# Patient Record
Sex: Female | Born: 1940 | Race: White | Hispanic: No | Marital: Married | Smoking: Never smoker
Health system: Southern US, Community
[De-identification: ages and names within clinical notes are randomized; demographics above are authoritative.]

## PROBLEM LIST (undated history)

## (undated) DIAGNOSIS — R2689 Other abnormalities of gait and mobility: Secondary | ICD-10-CM

## (undated) DIAGNOSIS — Z8679 Personal history of other diseases of the circulatory system: Secondary | ICD-10-CM

## (undated) DIAGNOSIS — Z973 Presence of spectacles and contact lenses: Secondary | ICD-10-CM

## (undated) DIAGNOSIS — R000 Tachycardia, unspecified: Secondary | ICD-10-CM

## (undated) DIAGNOSIS — Z87440 Personal history of urinary (tract) infections: Secondary | ICD-10-CM

## (undated) DIAGNOSIS — M1990 Unspecified osteoarthritis, unspecified site: Secondary | ICD-10-CM

## (undated) DIAGNOSIS — Z9289 Personal history of other medical treatment: Secondary | ICD-10-CM

## (undated) DIAGNOSIS — R1013 Epigastric pain: Secondary | ICD-10-CM

## (undated) DIAGNOSIS — I10 Essential (primary) hypertension: Secondary | ICD-10-CM

## (undated) DIAGNOSIS — M26609 Unspecified temporomandibular joint disorder, unspecified side: Secondary | ICD-10-CM

## (undated) DIAGNOSIS — R202 Paresthesia of skin: Secondary | ICD-10-CM

## (undated) DIAGNOSIS — F419 Anxiety disorder, unspecified: Secondary | ICD-10-CM

## (undated) DIAGNOSIS — K219 Gastro-esophageal reflux disease without esophagitis: Secondary | ICD-10-CM

## (undated) DIAGNOSIS — K922 Gastrointestinal hemorrhage, unspecified: Secondary | ICD-10-CM

## (undated) DIAGNOSIS — G43909 Migraine, unspecified, not intractable, without status migrainosus: Secondary | ICD-10-CM

## (undated) HISTORY — DX: Essential (primary) hypertension: I10

## (undated) HISTORY — DX: Tachycardia, unspecified: R00.0

## (undated) HISTORY — PX: ARTHROPLASTY: SHX135

## (undated) HISTORY — DX: Epigastric pain: R10.13

## (undated) HISTORY — PX: TONSILLECTOMY: SUR1361

## (undated) HISTORY — DX: Personal history of other diseases of the circulatory system: Z86.79

## (undated) HISTORY — PX: APPENDECTOMY: SHX54

## (undated) HISTORY — PX: REPLACEMENT TOTAL KNEE: SUR1224

## (undated) HISTORY — PX: OTHER SURGICAL HISTORY: SHX169

## (undated) HISTORY — DX: Unspecified temporomandibular joint disorder, unspecified side: M26.609

## (undated) HISTORY — DX: Migraine, unspecified, not intractable, without status migrainosus: G43.909

---

## 1983-03-21 HISTORY — PX: ABDOMINAL HYSTERECTOMY: SHX81

## 1998-05-18 ENCOUNTER — Other Ambulatory Visit: Admission: RE | Admit: 1998-05-18 | Discharge: 1998-05-18 | Payer: Self-pay | Admitting: Obstetrics and Gynecology

## 1999-06-16 ENCOUNTER — Other Ambulatory Visit: Admission: RE | Admit: 1999-06-16 | Discharge: 1999-06-16 | Payer: Self-pay | Admitting: Obstetrics and Gynecology

## 1999-08-16 ENCOUNTER — Encounter: Payer: Self-pay | Admitting: Internal Medicine

## 1999-08-16 ENCOUNTER — Encounter: Admission: RE | Admit: 1999-08-16 | Discharge: 1999-08-16 | Payer: Self-pay | Admitting: Internal Medicine

## 2000-02-18 HISTORY — PX: LAPAROSCOPIC APPENDECTOMY: SUR753

## 2000-03-03 ENCOUNTER — Encounter (INDEPENDENT_AMBULATORY_CARE_PROVIDER_SITE_OTHER): Payer: Self-pay | Admitting: Specialist

## 2000-03-03 ENCOUNTER — Encounter: Payer: Self-pay | Admitting: Emergency Medicine

## 2000-03-04 ENCOUNTER — Encounter: Payer: Self-pay | Admitting: Emergency Medicine

## 2000-03-04 ENCOUNTER — Encounter: Payer: Self-pay | Admitting: Surgery

## 2000-03-04 ENCOUNTER — Inpatient Hospital Stay (HOSPITAL_COMMUNITY): Admission: EM | Admit: 2000-03-04 | Discharge: 2000-03-05 | Payer: Self-pay | Admitting: Emergency Medicine

## 2001-05-20 ENCOUNTER — Encounter: Payer: Self-pay | Admitting: Otolaryngology

## 2001-05-20 ENCOUNTER — Encounter: Admission: RE | Admit: 2001-05-20 | Discharge: 2001-05-20 | Payer: Self-pay | Admitting: Otolaryngology

## 2001-07-10 ENCOUNTER — Other Ambulatory Visit: Admission: RE | Admit: 2001-07-10 | Discharge: 2001-07-10 | Payer: Self-pay | Admitting: Obstetrics and Gynecology

## 2004-01-28 ENCOUNTER — Ambulatory Visit: Payer: Self-pay | Admitting: Internal Medicine

## 2004-07-15 ENCOUNTER — Ambulatory Visit: Payer: Self-pay | Admitting: Internal Medicine

## 2004-09-15 ENCOUNTER — Ambulatory Visit: Payer: Self-pay | Admitting: Internal Medicine

## 2005-04-10 ENCOUNTER — Ambulatory Visit: Payer: Self-pay | Admitting: Internal Medicine

## 2005-04-17 ENCOUNTER — Ambulatory Visit: Payer: Self-pay | Admitting: Internal Medicine

## 2005-10-18 HISTORY — PX: KNEE ARTHROSCOPY: SUR90

## 2006-03-20 HISTORY — PX: WISDOM TOOTH EXTRACTION: SHX21

## 2006-03-29 ENCOUNTER — Ambulatory Visit: Payer: Self-pay | Admitting: Internal Medicine

## 2006-05-30 ENCOUNTER — Ambulatory Visit: Payer: Self-pay | Admitting: Internal Medicine

## 2006-12-06 ENCOUNTER — Ambulatory Visit: Payer: Self-pay | Admitting: Internal Medicine

## 2006-12-06 ENCOUNTER — Encounter: Payer: Self-pay | Admitting: Internal Medicine

## 2006-12-06 DIAGNOSIS — M26609 Unspecified temporomandibular joint disorder, unspecified side: Secondary | ICD-10-CM | POA: Insufficient documentation

## 2007-01-29 ENCOUNTER — Encounter: Payer: Self-pay | Admitting: Internal Medicine

## 2007-02-05 ENCOUNTER — Encounter: Payer: Self-pay | Admitting: Internal Medicine

## 2007-02-26 ENCOUNTER — Encounter: Payer: Self-pay | Admitting: *Deleted

## 2007-02-26 DIAGNOSIS — I10 Essential (primary) hypertension: Secondary | ICD-10-CM | POA: Insufficient documentation

## 2007-02-26 DIAGNOSIS — Z9089 Acquired absence of other organs: Secondary | ICD-10-CM | POA: Insufficient documentation

## 2007-02-26 DIAGNOSIS — K219 Gastro-esophageal reflux disease without esophagitis: Secondary | ICD-10-CM | POA: Insufficient documentation

## 2007-02-26 DIAGNOSIS — I471 Supraventricular tachycardia: Secondary | ICD-10-CM | POA: Insufficient documentation

## 2007-02-26 DIAGNOSIS — Z9889 Other specified postprocedural states: Secondary | ICD-10-CM | POA: Insufficient documentation

## 2007-02-26 DIAGNOSIS — Z9079 Acquired absence of other genital organ(s): Secondary | ICD-10-CM | POA: Insufficient documentation

## 2007-02-26 DIAGNOSIS — G43909 Migraine, unspecified, not intractable, without status migrainosus: Secondary | ICD-10-CM | POA: Insufficient documentation

## 2007-07-09 ENCOUNTER — Encounter: Payer: Self-pay | Admitting: Internal Medicine

## 2007-07-24 ENCOUNTER — Ambulatory Visit: Payer: Self-pay | Admitting: Internal Medicine

## 2007-07-24 DIAGNOSIS — R7989 Other specified abnormal findings of blood chemistry: Secondary | ICD-10-CM | POA: Insufficient documentation

## 2007-07-25 ENCOUNTER — Encounter: Payer: Self-pay | Admitting: Internal Medicine

## 2007-07-31 ENCOUNTER — Encounter: Payer: Self-pay | Admitting: Internal Medicine

## 2008-01-30 ENCOUNTER — Encounter: Payer: Self-pay | Admitting: Internal Medicine

## 2008-09-03 ENCOUNTER — Telehealth: Payer: Self-pay | Admitting: Internal Medicine

## 2008-12-11 ENCOUNTER — Ambulatory Visit: Payer: Self-pay | Admitting: Internal Medicine

## 2008-12-28 ENCOUNTER — Telehealth: Payer: Self-pay | Admitting: Internal Medicine

## 2008-12-30 ENCOUNTER — Encounter: Payer: Self-pay | Admitting: Internal Medicine

## 2009-02-01 ENCOUNTER — Encounter: Payer: Self-pay | Admitting: Internal Medicine

## 2009-02-01 LAB — HM MAMMOGRAPHY: HM Mammogram: NORMAL

## 2009-03-03 LAB — CONVERTED CEMR LAB: Pap Smear: NORMAL

## 2009-06-15 ENCOUNTER — Encounter: Payer: Self-pay | Admitting: Internal Medicine

## 2009-08-19 ENCOUNTER — Encounter: Payer: Self-pay | Admitting: Internal Medicine

## 2009-11-19 ENCOUNTER — Telehealth: Payer: Self-pay | Admitting: Internal Medicine

## 2009-12-13 ENCOUNTER — Ambulatory Visit: Payer: Self-pay | Admitting: Internal Medicine

## 2009-12-13 DIAGNOSIS — M1990 Unspecified osteoarthritis, unspecified site: Secondary | ICD-10-CM | POA: Insufficient documentation

## 2010-02-02 ENCOUNTER — Encounter: Payer: Self-pay | Admitting: Internal Medicine

## 2010-04-19 NOTE — Progress Notes (Signed)
Summary: REQ FOR RX   Phone Note Call from Patient Call back at [facility] Phone [phone]   Summary of Call: Pt has upcomming office visit. She would like refill of celebrex to go to [facility]. Rx is old and she only takes med as needed. Please advise.  Initial call taken by: Lamar Sprinkles, CMA,  November 19, 2009 3:38 PM  Follow-up for Phone Call        called pt - will renew celebrex.    New/Updated Medications: CELEBREX 200 MG CAPS (CELECOXIB) 1 by mouth once daily as needed arthritic pain Prescriptions: CELEBREX 200 MG CAPS (CELECOXIB) 1 by mouth once daily as needed arthritic pain  #30 x 2   Entered and Authorized by:   Jacques Navy MD   Signed by:   Jacques Navy MD on 11/19/2009   Method used:   Electronically to        [facility]. [phone]* (retail)       [address]       [place], [place]  [address]       Ph: [phone]       Fax: [phone]   RxID:   [id number]

## 2010-04-19 NOTE — Letter (Signed)
Summary: Screening/[facility]  Screening/[facility]   Imported By: Lester Benton 08/19/2009 10:22:34  _____________________________________________________________________  External Attachment:    Type:   Image     Comment:   External Document

## 2010-04-19 NOTE — Assessment & Plan Note (Signed)
Summary: YEARLY---STC   Vital Signs:  Patient profile:   70 year old female Height:      67 inches Weight:      162 pounds BMI:     25.46 O2 Sat:      97 % on Room air Temp:     97.0 degrees F oral Pulse rate:   75 / minute BP sitting:   140 / 64  (left arm) Cuff size:   regular  Vitals Entered By: Alysia Penna (December 13, 2009 1:24 PM)  O2 Flow:  Room air CC: pt here for cpx. /cp sma   Primary Care Koehn Salehi:  Norins  CC:  pt here for cpx. /cp sma.  History of Present Illness: Patient presents for a preventive care exam and follow-up for medical problems.  She does have  chronic left knee pain which affects her gait. She has been working with a personal trader: strengthening quads and hamstrings which has helped. She is also working to strengthing UE and this has helped left shoulder pain. She has a great respect for falls and has been working on balance as well as strength to prevent falls.  She has otherwise been doing well without other medical problems, no surgeries, no injuries.  Denies having any problems with depression or depressed mood. Totally independent in all ADLs. 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   Preventive Screening-Counseling & Management  Alcohol-Tobacco     Alcohol drinks/day: <1     Alcohol type: wine     Alcohol Counseling: not indicated; use of alcohol is not excessive or problematic     Smoking Status: never  Caffeine-Diet-Exercise     Caffeine use/day: no caffeinated beverages     Diet Comments: heart healthy diet     Diet Counseling: not indicated; diet is assessed to be healthy     Does Patient Exercise: yes     Type of exercise: stretching and muscle training     Exercise (avg: min/session): 30-60     Times/week: <3     Exercise Counseling: not indicated; exercise is adequate     [facility] Depression Score: no  depression  Hep-HIV-STD-Contraception     STD Risk: no risk noted     Dental Visit-last 6 months yes     SBE monthly: no     Sun Exposure-Excessive: no  Safety-Violence-Falls     Seat Belt Use: yes     Firearms in the Home: no firearms in the home     Smoke Detectors: yes     Violence in the Home: no risk noted     Sexual Abuse: no     Fall Risk: aware of risk     Fall Risk Counseling: not indicated; no significant falls noted      Sexual History:  currently monogamous.        Drug Use:  never.        Blood Transfusions:  no.    Current Medications (verified): 1)  Amlodipine Besylate 10 Mg  Tabs (Amlodipine Besylate) .Marland Kitchen.. 1 By Mouth Once Daily 2)  Premarin 0.625 Mg  Tabs (Estrogens Conjugated) .... Take Once Daily 3)  Furosemide 40 Mg  Tabs (Furosemide) .... Take One Tablet Once Daily 4)  Nexium 40 Mg  Cpdr (Esomeprazole Magnesium) .... Take 1 Tablet By Mouth Once A Day 5)  Calcium 1200 1200-1000 Mg-Unit  Chew (Calcium Carbonate-Vit D-Min) .... Take  1 Tablet Twice Daily 6)  Vitamin B-12 1000 Mcg  Tabs (Cyanocobalamin) .... As Directed 7)  Celebrex 200 Mg Caps (Celecoxib) .Marland Kitchen.. 1 By Mouth Once Daily As Needed Arthritic Pain  Allergies (verified): 1)  ! Sulfa 2)  ! 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 Nona Dell - Dr. Lequita Halt              Oral surgeon - Dr. Delma Post              Dentist - Dr. 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  Risk Factors: Alcohol Use: <1 (12/13/2009) Caffeine Use: no caffeinated beverages (12/13/2009) Diet: heart healthy diet (12/13/2009) Exercise: yes (12/13/2009)  Risk Factors: Smoking Status: never (12/13/2009)  Social History: Caffeine use/day:  no caffeinated beverages Does Patient Exercise:  yes STD Risk:  no risk noted Dental Care w/in 6 mos.:  yes Sun Exposure-Excessive:  no Seat Belt Use:  yes Fall Risk:  aware of risk Sexual History:  currently monogamous Drug Use:  never Blood Transfusions:  no  Review of Systems  The patient denies anorexia, fever, weight loss, weight gain, vision loss, decreased hearing, hoarseness, chest pain, syncope, dyspnea on exertion, peripheral edema, headaches, abdominal pain, hematochezia, severe indigestion/heartburn, incontinence, muscle weakness, suspicious skin lesions, difficulty walking, depression, abnormal bleeding, enlarged lymph nodes, angioedema, and breast masses.         occasional SVT responds to valsalva. Has discomfort right knee but not as much as left.   Physical Exam  General:  alert, well-developed, well-nourished, well-hydrated, appropriate dress, and normal appearance.   Head:  normocephalic, atraumatic, and no abnormalities observed.   Eyes:  vision grossly intact, pupils equal, pupils round, corneas and lenses clear, and no injection.   Ears:  R ear normal, L ear normal, and no external deformities.   Nose:  no external deformity and no external erythema.   Mouth:  Oral mucosa and oropharynx without lesions or exudates.  Teeth in good repair. Neck:  supple, full ROM, no thyromegaly, and no carotid bruits.   Chest Wall:  no deformities and no tenderness.   Breasts:   deferred to Gyn Lungs:  Normal respiratory effort, chest expands symmetrically. Lungs are clear to auscultation, no crackles or wheezes. Heart:  Normal rate and regular rhythm. S1 and S2 normal without gallop, murmur, click, rub or other extra sounds. Abdomen:  soft, non-tender, normal bowel sounds, no masses, no guarding, and no hepatomegaly.   Genitalia:  deferred to gyn Msk:  normal ROM, no joint swelling, no joint warmth, no joint deformities, and no joint instability.   Pulses:  2+ radial and dorsalis pedis pulses Extremities:  No clubbing, cyanosis,  edema, or deformity noted with normal full range of motion of all joints.   Neurologic:  alert & oriented X3, cranial nerves II-XII intact, gait normal, and DTRs symmetrical and normal.   Skin:  turgor normal, color normal, no rashes, and no suspicious lesions.  Mole in the left preauricular area -appears benign Cervical Nodes:  No lymphadenopathy noted Psych:  Oriented X3, memory intact for recent and remote, normally interactive, and good eye contact.     Impression & Recommendations:  Problem # 1:  HYPERTENSION (ICD-401.9)  Her updated medication list for this problem includes:    Amlodipine Besylate 10 Mg Tabs (Amlodipine besylate) .Marland Kitchen... 1 by mouth once daily    Furosemide 40 Mg Tabs (Furosemide) .Marland Kitchen... Take one tablet once daily  BP today: 140/64 Prior BP: 140/76 (12/11/2008)  Reviewed home record - good control.  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   Problem # 3:  DYSPEPSIA, HX OF (ICD-V12.79) Controlled with the use of nexium.  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 She will use occasional as needed celebrex 100mg .  Plan - continue exercise regimen, consider 3 times a week.   Her updated  medication list for this problem includes:    Celebrex 100 Mg Caps (Celecoxib) .Marland Kitchen... 1 by mouth once daily as needed  Problem # 5:  Preventive Health Care (ICD-V70.0) Unremarkable interval history and a normal limited exam. Lab from outside reviewed and normal including normal LDL cholesterol, blood sugar and renal function. She is current with gyn and breast health. she has had recent normal bone density study. Advised her on colorectal cancer screening and colonoscopy - she defers at this time. Immunizations are up to date: Tetnus Spet '10, Pnuemonia vax Sept '08, Shingles Jan '07.  Patient has no signs or symptoms of depression. She is independent in ADLs. No cognitive problems - see HPI. Increased fall risk with arthritic knee but she is aware and works on balance and strengthening to reduce this risk. She is counselled on colorectal cancer screening.  In summary - a delightful woman who is medically stable and in good health. She is investing in her health with concrete dividends: decreased pain and increased mobility.  she will return as needed or 1 year.   Complete Medication List: 1)  Amlodipine Besylate 10 Mg Tabs (Amlodipine besylate) .Marland Kitchen.. 1 by mouth once daily 2)  Premarin 0.625 Mg Tabs (Estrogens conjugated) .... Take once daily 3)  Furosemide 40 Mg Tabs (Furosemide) .... Take one tablet once daily 4)  Nexium 40 Mg Cpdr (Esomeprazole magnesium) .... Take 1 tablet by mouth once a day 5)  Calcium 1200 1200-1000 Mg-unit Chew (Calcium carbonate-vit d-min) .... Take 1 tablet twice daily 6)  Vitamin B-12 1000 Mcg Tabs (Cyanocobalamin) .... As directed 7)  Celebrex 100 Mg Caps (Celecoxib) .Marland Kitchen.. 1 by mouth once daily as needed  Other Orders: Future Orders: Flu Vaccine 48yrs + MEDICARE PATIENTS (A2130) ... 12/14/2009 Administration Flu vaccine - MCR (G0008) ... 12/14/2009 Prescriptions: NEXIUM 40 MG  CPDR (ESOMEPRAZOLE MAGNESIUM) Take 1 tablet by mouth once a day  #90 x 3   Entered and  Authorized by:   Jacques Navy MD   Signed by:   Jacques Navy MD on 12/13/2009   Method used:   Electronically to  [facility]* (retail)             ,          Ph: [phone]       Fax: [phone]   RxID:   [id number] FUROSEMIDE 40 MG  TABS (FUROSEMIDE) Take one tablet once daily  #90 x 3   Entered and Authorized by:   Jacques Navy MD   Signed by:   Jacques Navy MD on 12/13/2009   Method used:   Electronically to        [facility]* (retail)             ,          Ph: [phone]       Fax: [phone]   RxID:   [id number] AMLODIPINE BESYLATE 10 MG  TABS (AMLODIPINE BESYLATE) 1 by mouth once daily  #90 x 3   Entered and Authorized by:   Jacques Navy MD   Signed by:   Jacques Navy MD on 12/13/2009   Method used:   Electronically to        [facility]* (retail)             ,          Ph: [phone]       Fax: [phone]   RxID:   [id number] CELEBREX 100 MG CAPS (CELECOXIB) 1 by mouth once daily as needed  #90 x 3   Entered and Authorized by:   Jacques Navy MD   Signed by:   Jacques Navy MD on 12/13/2009   Method used:   Electronically to        [facility]* (retail)             ,          Ph: [phone]       Fax: [phone]   RxID:   [id number]    Preventive Care Screening  Bone Density:    Date:  02/01/2009    Results:  normal std dev  Pap Smear:    Date:  03/03/2009    Results:  normal   Mammogram:    Date:  02/01/2009    Results:  normal  Flu Vaccine Consent Questions     Do you have a history of severe allergic reactions to this vaccine? no    Any prior history of allergic reactions to egg and/or gelatin? no    Do you have a sensitivity to the preservative Thimersol? no    Do you have a past history of Guillan-Barre Syndrome? no    Do you currently have an acute febrile illness? no    Have you ever had a severe reaction to latex? no    Vaccine information given and explained  to patient? yes    Are you currently pregnant? no    Lot Number:[id number]   Exp Date:09/17/2010   Site Given  Left Deltoid IM  normal   Mammogram:    Date:  02/01/2009    Results:  normal    .lbmedflu

## 2010-05-30 ENCOUNTER — Telehealth: Payer: Self-pay | Admitting: Internal Medicine

## 2010-06-07 NOTE — Progress Notes (Signed)
Summary: Needs f/u ov   Phone Note Call from Patient Call back at Husband cell [phone]   Summary of Call: Patient is requesting labs, husband's wk used to do this every year but pt is retired now.  Initial call taken by: Lamar Sprinkles, CMA,  May 30, 2010 10:02 AM  Follow-up for Phone Call        same argument as for the husband in terms of insurance coverage. metabolic 401.9; CBC  524.60; lipid 790.6 Follow-up by: Jacques Navy MD,  May 30, 2010 1:06 PM  Additional Follow-up for Phone Call Additional follow up Details #1::        Spoke w/husband-  would like office visit w/Norins first. Please help set pt & husband (see his phone note) for f/u apt (pt does not want cpx) 30 min for each please  [facility]!! Additional Follow-up by: Lamar Sprinkles, CMA,  May 30, 2010 6:49 PM    Additional Follow-up for Phone Call Additional follow up Details #2::    Pt wants to wait to sched appt, she will call when she is ready to set up appt. Follow-up by: Verdell Face,  May 31, 2010 8:46 AM

## 2010-08-05 NOTE — H&P (Signed)
[facility]  Patient:    Kaylee Davis, Kaylee Davis                     MRN: [id number] Adm. Date:  03/04/00 Attending:  Currie Paris, M.D.                         History and Physical  ACCOUNT NUMBER:  [id number].  CHIEF COMPLAINT:  Abdominal pain.  CLINICAL HISTORY:  Kaylee Davis is a 70 year old lady being admitted via the emergency room for acute appendicitis.  The patient presented to the emergency room with abdominal pain which had begun earlier this morning at approximately 8:30 on the morning of March 03, 2000, had begun as what she thought was food poisoning with some cramps across the middle of her abdomen, but has gradually localized into the lower abdomen, predominantly the right lower quadrant.  It has been associated with some nausea.  No vomiting.  She spoke with her primary care physicians office and was told to try some enema but this gave her no relief and so she presented to the emergency room.  She has had some pain medication at the time I have seen her but seems to have pain primarily across the lower abdomen with most of it in the right lower quadrant.  The patient denies having any prior symptoms similar to this.  She has had a bleeding ulcer in the past but no other GI complaints.  ALLERGIES:  SULFA and to "RED DYE GIVEN WITH SULFA" and a questionable allergy to PENICILLIN, but she is not certain about that.  MEDICATIONS:  She is on Premarin.  OPERATIONS:  She had a total hysterectomy and apparently had some right ureteral injury requiring second operative procedure.  HABITS:  Smokes none.  Alcohol:  Occasional.  REVIEW OF SYSTEMS:  HEENT:  Negative.  CHEST:  No cough or shortness of breath.  HEART:  No history of murmurs or hypertension.  There is a questionable history of an irregular heart beat.  ABDOMEN:  Negative except for HPI.  GU:  Negative.  EXTREMITIES:  Negative.  PHYSICAL EXAMINATION  GENERAL:  Patient  is a healthy-appearing 70 year old who appears younger than her stated age.  HEENT:  Head is normocephalic.  Eyes:  Nonicteric.  Pupils round and regular.  NECK:  Supple.  No masses or thyromegaly.  LUNGS:  Clear to auscultation.  HEART:  Regular.  No murmurs, rubs, or gallops.  ABDOMEN:  Soft with distinct right lower quadrant tenderness with some early rebound.  Decreased bowel sounds, but they are present.  No masses or hepatosplenomegaly.  PELVIC:  Not done.  RECTAL:  Not done.  EXTREMITIES:  No cyanosis or edema.  Pulses are intact.  LABORATORY AND X-RAY FINDINGS:  EKG appears to be normal.  White count is 14,000, hemoglobin 13.  Pro time and PTT are normal.  CT scan of the abdomen is done and is reviewed.  She has what looks like a dilated appendix that is edematous and appears to have a fecalith within it consistent with appendicitis.  There is no evidence of other inflammatory process in the abdomen.  Her stomach is still full of contrast on the CT scan.  IMPRESSION:  Acute appendicitis.  PLAN:  We are going to go ahead and give her some IV fluids, starting antibiotics and plan laparoscopic appendectomy.  We will get a preoperative chest x-ray and CMET prior to proceeding.  Will plan  to delay her a couple of hours to allow her stomach to clear from the contrast that she was given.  I have gone over the indications, risks and complications of surgery.  I told her we will try to do her laparoscopically but because of her prior surgeries with a lower midline incision as well as a right flank incision, it may be necessary to be done open.  I think all questions have been answered. DD: 03/04/00 TD:  03/04/00 Job: [id number]

## 2010-08-05 NOTE — Assessment & Plan Note (Signed)
[facility]                           PRIMARY CARE OFFICE NOTE   Kaylee, Davis                   MRN:          [id number]  DATE:03/29/2006                            DOB:          1940-12-31    Kaylee Davis is a 70 year old Caucasian woman who presents acutely with  a severe headache.   The patient before has had a history of migraine headaches, but has not  had one since she was menstruating on a regular basis.   The patient reports the onset today about midday of visual change in the  left eye with lines and bright lights in the visual field. She then  developed a severe headache on the right side of her head in the frontal  and parietal region. This was accompanied by nausea, photophobia, and  generalized malaise. She reports that it was similar to migraines that  she remembers from the past. She and her husband though are concerned as  this may represent a neurologic event.   PAST MEDICAL HISTORY:  Not available to me. She is treated for  hypertension and GERD.   CURRENT MEDICATIONS:  1. Norvasc 5 mg daily.  2. Lasix 40 mg daily.  3. Nexium 40 mg daily.  4. Premarin 0.625 mg daily.   HISTORY:  The patient's history is significant for the patient having  had a history of PSVT.   PHYSICAL EXAMINATION:  VITAL SIGNS: Temperature 97.9, blood pressure  160/94, pulse 68.  GENERAL: Well nourished, well groomed woman in no acute distress, but is  obviously uncomfortable.  HEENT: Normocephalic atraumatic and unremarkable.  CARDIOVASCULAR: Revealed 2+ radial pulse, she had a quiet precordium  with a regular rate and rhythm.  NEUROLOGIC: The patient is awake and alert. Her speech is clear. Her  memory is good. Her cognition seems normal. Cranial nerves 2-12 are  grossly intact with normal facial symmetry and movement. Extraocular  muscles were intact. Pupils equal, round and reactive. The patient had  no deviation of the tongue or  uvula. She had a normal shoulder shrug.  Motor strength was normal with equal grips and equal proximal motor  strength in the upper extremities. She had no tremor or cerebellar  abnormalities.   ASSESSMENT AND PLAN:  Migraine. The patient was given Imitrex 6 mg  subcutaneously. This initially made her feel somewhat faint. After 75  minutes, the patient did report that she did feel somewhat better. She  had progressive and persistent nausea and was given Phenergan 25 mg i.  m.   After 75 minutes, the patient did feel strong enough to go home although  she still headache, but it was better. She was provided with a  prescription for Demoral 50 mg number of 5 to take 1 every 4 hours as  needed for pain and Phenergan 12.5 mg to take every 4 hours as needed  for nausea. The patient was carefully instructed that if she has a  change in her symptoms or progressive severe pain and discomfort she is  notify the office for further evaluation.     Kaylee Gess Norins, MD  Electronically Signed    MEN/MedQ  DD: 03/29/2006  DT: 03/30/2006  Job #: [id number]

## 2010-08-05 NOTE — Op Note (Signed)
[facility]  Patient:    Kaylee Davis, Kaylee Davis                   MRN: [id number] Proc. Date: 03/04/00 Adm. Date:  [id number] Attending:  Charlton Haws CC:         Rosalyn Gess. Norins, M.D. [facility]   Operative Report  PREOPERATIVE DIAGNOSIS:  Acute appendicitis.  POSTOPERATIVE DIAGNOSIS:  Acute appendicitis.  PROCEDURE:  Laparoscopic appendectomy.  SURGEON:  Currie Paris, M.D.  ANESTHESIA:  General endotracheal.  CLINICAL HISTORY:  The patient is a 70-year-old woman who has presented with signs and symptoms of acute appendicitis and a CT confirming that.  The patient was admitted from the emergency room at about 2 a.m., and we proceeded with surgery approximately 7 a.m., giving her a little time to get some antibiotics aboard and clear the oral contrast out of her stomach from the CT scan.  DESCRIPTION OF PROCEDURE:  Patient brought to the operating room and after satisfactory general endotracheal anesthesia had been obtained, the abdomen was prepped and draped.  I used 0.25% Marcaine at each incision site.  Made an umbilical incision, and the fascia was opened and the peritoneal cavity entered under direct vision.  A pursestring was placed and the Hasson introduced and the abdomen insufflated to 15.  Scanning of the abdomen revealed no gross abnormalities.  There were basically no adhesions from the prior lower midline incision.  A right upper quadrant 5 mm trocar was placed under direct vision.  The patient was then placed in Trendelenburg, and a left lower quadrant 11 mm trocar placed under direct vision.  With retractors, with the camera in the left lower quadrant and some graspers, I was able to identify the appendix, that was lying medially in its usual position.  It was edematous and inflamed but had not perforated.  There was no free peritoneal fluid noted.  The appendix was grasped and elevated a little bit and using the  Harmonic scalpel, the mesoappendix divided starting at near the tip of the appendix and dividing it down to the base.  This appeared completely dry.  Once the base was identified and cleared off, I inserted the Endo-GIA and divided the appendix.  The appendix was placed in a bag and brought out the umbilical port.  The umbilical port was replaced and the abdomen reinsufflated.  We irrigated to make sure everything was dry, saw no other abnormalities on another look at the abdomen, and completed the case.  The left lower quadrant port was removed under direct vision, and there was no bleeding from that site.  Similarly, the right upper quadrant 5 mm was removed, and there was no bleeding.  The abdomen was desufflated through the umbilical port, and the pursestring was tied down.  Skin wounds were closed with 4-0 Monocryl subcuticular plus Steri-Strips.  The patient tolerated the procedure well.  There were no operative complications.  All counts were correct. DD:  03/04/00 TD:  03/05/00 Job: [id number]

## 2011-01-02 ENCOUNTER — Ambulatory Visit (INDEPENDENT_AMBULATORY_CARE_PROVIDER_SITE_OTHER): Payer: Medicare Other | Admitting: *Deleted

## 2011-01-02 DIAGNOSIS — Z23 Encounter for immunization: Secondary | ICD-10-CM

## 2011-01-31 ENCOUNTER — Other Ambulatory Visit (INDEPENDENT_AMBULATORY_CARE_PROVIDER_SITE_OTHER): Payer: Medicare Other

## 2011-01-31 ENCOUNTER — Encounter: Payer: Self-pay | Admitting: Internal Medicine

## 2011-01-31 ENCOUNTER — Other Ambulatory Visit: Payer: Self-pay | Admitting: Internal Medicine

## 2011-01-31 ENCOUNTER — Ambulatory Visit (INDEPENDENT_AMBULATORY_CARE_PROVIDER_SITE_OTHER): Payer: Medicare Other | Admitting: Internal Medicine

## 2011-01-31 VITALS — BP 122/70 | HR 64 | Temp 98.4°F | Ht 67.0 in | Wt 166.0 lb

## 2011-01-31 DIAGNOSIS — Z0000 Encounter for general adult medical examination without abnormal findings: Secondary | ICD-10-CM

## 2011-01-31 DIAGNOSIS — R7989 Other specified abnormal findings of blood chemistry: Secondary | ICD-10-CM

## 2011-01-31 DIAGNOSIS — I10 Essential (primary) hypertension: Secondary | ICD-10-CM

## 2011-01-31 DIAGNOSIS — G43909 Migraine, unspecified, not intractable, without status migrainosus: Secondary | ICD-10-CM

## 2011-01-31 DIAGNOSIS — Z136 Encounter for screening for cardiovascular disorders: Secondary | ICD-10-CM

## 2011-01-31 LAB — COMPREHENSIVE METABOLIC PANEL
ALT: 11 U/L (ref 0–35)
AST: 16 U/L (ref 0–37)
BUN: 13 mg/dL (ref 6–23)
Calcium: 9.1 mg/dL (ref 8.4–10.5)
Chloride: 105 mEq/L (ref 96–112)
Creatinine, Ser: 0.6 mg/dL (ref 0.4–1.2)
GFR: 104.25 mL/min (ref >60.00)
Total Bilirubin: 0.7 mg/dL (ref 0.3–1.2)

## 2011-01-31 LAB — LIPID PANEL
Cholesterol: 203 mg/dL — ABNORMAL HIGH (ref 0–200)
HDL: 68 mg/dL (ref >39.00)
Total CHOL/HDL Ratio: 3
Triglycerides: 158 mg/dL — ABNORMAL HIGH (ref 0.0–149.0)
VLDL: 31.6 mg/dL (ref 0.0–40.0)

## 2011-01-31 LAB — HEPATIC FUNCTION PANEL
ALT: 11 U/L (ref 0–35)
Alkaline Phosphatase: 84 U/L (ref 39–117)
Bilirubin, Direct: 0 mg/dL (ref 0.0–0.3)
Total Bilirubin: 0.7 mg/dL (ref 0.3–1.2)

## 2011-01-31 MED ORDER — AMLODIPINE BESYLATE 10 MG PO TABS: 10.0000 mg | ORAL_TABLET | Freq: Every day | ORAL | Status: DC

## 2011-01-31 MED ORDER — ESOMEPRAZOLE MAGNESIUM 40 MG PO CPDR: 40.0000 mg | DELAYED_RELEASE_CAPSULE | Freq: Every day | ORAL | Status: DC

## 2011-01-31 MED ORDER — FUROSEMIDE 40 MG PO TABS: 40.0000 mg | ORAL_TABLET | Freq: Every day | ORAL | Status: DC

## 2011-01-31 MED ORDER — ESTROGENS CONJUGATED 0.625 MG PO TABS: 0.6250 mg | ORAL_TABLET | Freq: Every day | ORAL | Status: DC

## 2011-01-31 NOTE — Progress Notes (Signed)
Subjective:    Patient ID: Kaylee Davis, female    DOB: 09-Dec-1940, 70 y.o.   MRN: [id number]  HPI   The patient is here for annual Medicare wellness examination and management of other chronic and acute problems.   The risk factors are reflected in the social history.  The roster of all physicians providing medical care to patient - is listed in the Snapshot section of the chart.  Activities of daily living:  The patient is 100% inedpendent in all ADLs: dressing, toileting, feeding as well as independent mobility  Home safety : The patient has smoke detectors in the home. They wear seatbelts. No firearms at home. There is no violence in the home.   There is no risks for hepatitis, STDs or HIV. There is no   history of blood transfusion. They have no travel history to infectious disease endemic areas of the world.  The patient has seen their dentist in the last six month. They have seen their eye doctor in the last year. They admit to any hearing difficulty and have not had audiologic testing in the last year. Had testing in the past which revealed minor high frequency loss that does not affect quality of life.   They do not  have excessive sun exposure. Discussed the need for sun protection: hats, long sleeves and use of sunscreen if there is significant sun exposure.   Diet: the importance of a healthy diet is discussed. They do have a healthy diet.  The patient has a regular exercise program: light weight lifting and cardio  , 1 hour duration, 2 times per week.  The benefits of regular aerobic exercise were discussed.  Depression screen: there are no signs or vegative symptoms of depression- irritability, change in appetite, anhedonia, sadness/tearfullness.  Cognitive assessment: the patient manages all their financial and personal affairs and is actively engaged.   The following portions of the patient's history were reviewed and updated as appropriate: allergies, current  medications, past family history, past medical history,  past surgical history, past social history  and problem list.  Vision, hearing, body mass index were assessed and reviewed.   During the course of the visit the patient was educated and counseled about appropriate screening and preventive services including : fall prevention , diabetes screening, nutrition counseling, colorectal cancer screening, and recommended immunizations.   Review of Systems     Objective:   Physical Exam Vitals reviewed-normal. Gen'l: well nourished, well developed white woman in no distress HEENT - [place]/AT, EACs/TMs normal, oropharynx with native dentition in good condition, no buccal or palatal lesions, posterior pharynx clear, mucous membranes moist. C&S clear, PERRLA, fundi - normal Neck - supple, no thyromegaly Nodes- negative submental, cervical, supraclavicular regions Chest - no deformity, no CVAT Lungs - cleat without rales, wheezes. No increased work of breathing Breast - deferred Cardiovascular - regular rate and rhythm, quiet precordium, no murmurs, rubs or gallops, 2+ radial, DP and PT pulses Abdomen - BS+ x 4, no HSM, no guarding or rebound or tenderness Pelvic - deferred to gyn Rectal - deferred to gyn Extremities - no clubbing, cyanosis, edema or deformity.  Neuro - A&O x 3, CN II-XII normal, motor strength normal and equal, DTRs 2+ and symmetrical biceps, radial, and patellar tendons. Cerebellar - no tremor, no rigidity, fluid movement and normal gait. Derm - Head, neck, back, abdomen and extremities without suspicious lesions  Lab Results  Component Value Date   GLUCOSE 92 01/31/2011   CHOL 203*  01/31/2011   TRIG 158.0* 01/31/2011   HDL 68.00 01/31/2011   LDLDIRECT 120.3 01/31/2011   ALT 11 01/31/2011   ALT 11 01/31/2011   AST 16 01/31/2011   AST 16 01/31/2011   NA 142 01/31/2011   K 4.6 01/31/2011   CL 105 01/31/2011   CREATININE 0.6 01/31/2011   BUN 13 01/31/2011   CO2 29  01/31/2011   TSH 2.73 01/31/2011           Assessment & Plan:

## 2011-01-31 NOTE — Patient Instructions (Signed)
Normal exam.  Ok to wean down on the nexium - every other day for 2 weeks, every 3rd day for 2 weeks and if doing ok stop. For a flare of discomfort it is ok to use either nexium or otc zantac, etc.  Routine labs for today.

## 2011-02-02 LAB — VITAMIN D 1,25 DIHYDROXY
Vitamin D2 1, 25 (OH)2: 52 pg/mL
Vitamin D3 1, 25 (OH)2: 41 pg/mL

## 2011-02-03 DIAGNOSIS — Z0000 Encounter for general adult medical examination without abnormal findings: Secondary | ICD-10-CM | POA: Insufficient documentation

## 2011-02-03 NOTE — Assessment & Plan Note (Signed)
Lipid panel reveals LDL to better than goal of 130 or less. Remainder of lab results are normal.

## 2011-02-03 NOTE — Assessment & Plan Note (Signed)
BP Readings from Last 3 Encounters:  01/31/11 122/70  12/13/09 140/64  12/11/08 140/76   Good control today and adequate control over the past three years. Renal function and electrolytes are normal.  Plan - no change in regimen.

## 2011-02-03 NOTE — Assessment & Plan Note (Signed)
Very few headaches at this time. Her previous migraines may have been menstrual headaches better after menopause.

## 2011-02-03 NOTE — Assessment & Plan Note (Signed)
Interval medical history is unremarkable. Phyiscal exam, sans breast and pelvic, is normal. Lab results are in normal range. Colorectal cancer screening - encouraged her to schedule exam. Breast cancer screening - last study Nov '10 - due for follow-up this year (q2 year schedule per USPHTF recommendations). Gyn - she is s/p hysterectomy for non-malignant reasons with normal PAP Dec /10 - no indication for further PAP smears, recommend vulvo-vaginal exam every 3-5 years. Immunizations are current.  In summary - a very nice woman who appears to be medically stable. She is asked to return as needed or in 1 year.

## 2011-02-05 ENCOUNTER — Encounter: Payer: Self-pay | Admitting: Internal Medicine

## 2011-02-06 ENCOUNTER — Telehealth: Payer: Self-pay

## 2011-02-06 MED ORDER — ESOMEPRAZOLE MAGNESIUM 40 MG PO CPDR: 40.0000 mg | DELAYED_RELEASE_CAPSULE | Freq: Every day | ORAL | Status: DC

## 2011-02-06 MED ORDER — FUROSEMIDE 40 MG PO TABS: 40.0000 mg | ORAL_TABLET | Freq: Every day | ORAL | Status: DC

## 2011-02-06 MED ORDER — ESTROGENS CONJUGATED 0.625 MG PO TABS: 0.6250 mg | ORAL_TABLET | Freq: Every day | ORAL | Status: DC

## 2011-02-06 MED ORDER — AMLODIPINE BESYLATE 10 MG PO TABS: 10.0000 mg | ORAL_TABLET | Freq: Every day | ORAL | Status: DC

## 2011-02-06 NOTE — Telephone Encounter (Signed)
Patient called Kaylee Davis stating that she was seen for CPX and rx were sent to local pharmacy instead of mail order. Patient is requesting rx to be sent to [facility] RX updated and sent to [facility]// pt notified

## 2011-02-15 ENCOUNTER — Other Ambulatory Visit: Payer: Self-pay | Admitting: Internal Medicine

## 2011-02-16 ENCOUNTER — Other Ambulatory Visit: Payer: Self-pay | Admitting: Internal Medicine

## 2011-02-16 ENCOUNTER — Encounter: Payer: Self-pay | Admitting: Internal Medicine

## 2011-11-29 ENCOUNTER — Telehealth: Payer: Self-pay | Admitting: Internal Medicine

## 2011-11-29 NOTE — Telephone Encounter (Signed)
Patient calling about back pain x 3 weeks.  She has no pain with standing, only when sitting down.  Ice packs help the discomfort also.   Triaged per Back Symptoms.  Pt. would like to be seen this week.   Scheduled for  11a Thursday with Dr. Debby Bud.

## 2011-11-30 ENCOUNTER — Ambulatory Visit (INDEPENDENT_AMBULATORY_CARE_PROVIDER_SITE_OTHER)
Admission: RE | Admit: 2011-11-30 | Discharge: 2011-11-30 | Disposition: A | Discharge status: Home / Self Care | Payer: Medicare Other | Source: Ambulatory Visit | Attending: Internal Medicine | Admitting: Internal Medicine

## 2011-11-30 ENCOUNTER — Encounter: Payer: Self-pay | Admitting: Internal Medicine

## 2011-11-30 ENCOUNTER — Ambulatory Visit (INDEPENDENT_AMBULATORY_CARE_PROVIDER_SITE_OTHER): Payer: Medicare Other | Admitting: Internal Medicine

## 2011-11-30 VITALS — BP 140/100 | HR 78 | Temp 98.3°F | Resp 16 | Wt 170.0 lb

## 2011-11-30 DIAGNOSIS — M546 Pain in thoracic spine: Secondary | ICD-10-CM

## 2011-11-30 MED ORDER — TRAMADOL HCL 50 MG PO TABS: 50.0000 mg | ORAL_TABLET | Freq: Four times a day (QID) | ORAL | Status: AC | PRN

## 2011-11-30 MED ORDER — CYCLOBENZAPRINE HCL 5 MG PO TABS: 5.0000 mg | ORAL_TABLET | Freq: Three times a day (TID) | ORAL | Status: AC | PRN

## 2011-11-30 NOTE — Patient Instructions (Signed)
1. Back Pain - Your exam tells [place] that your back pain is not likely to be a disc problem. You may have a compression fracture of your vertebra or muscle strain.  PLAN - We will get x-rays of your back to look for a compression fracture. We will decide on the treatment based on the results of the x-ray.  Back, Compression Fracture A compression fracture happens when a force is put upon the length of your spine. Slipping and falling on your bottom are examples of such a force. When this happens, sometimes the force is great enough to compress the building blocks (vertebral bodies) of your spine. Although this causes a lot of pain, this can usually be treated at home, unless your caregiver feels hospitalization is needed for pain control. Your backbone (spinal column) is made up of 24 main vertebral bodies in addition to the sacrum and coccyx (see illustration). These are held together by tough fibrous tissues (ligaments) and by support of your muscles. Nerve roots pass through the openings between the vertebrae. A sudden wrenching move, injury, or a fall may cause a compression fracture of one of the vertebral bodies. This may result in back pain or spread of pain into the belly (abdomen), the buttocks, and down the leg into the foot. Pain may also be created by muscle spasm alone. Large studies have been undertaken to determine the best possible course of action to help your back following injury and also to prevent future problems. The recommendations are as follows. FOLLOWING A COMPRESSION FRACTURE: Do the following only if advised by your caregiver.    If a back brace has been suggested or provided, wear it as directed.   DO NOT stop wearing the back brace unless instructed by your caregiver.   When allowed to return to regular activities, avoid a sedentary life style. Actively exercise. Sporadic weekend binges of tennis, racquetball, water skiing, may actually aggravate or create problems,  especially if you are not in condition for that activity.   Avoid sports requiring sudden body movements until you are in condition for them. Swimming and walking are safer activities.   Maintain good posture.   Avoid obesity.   If not already done, you should have a DEXA scan. Based on the results, be treated for osteoporosis.  FOLLOWING ACUTE (SUDDEN) INJURY:  Only take over-the-counter or prescription medicines for pain, discomfort, or fever as directed by your caregiver.   Use bed rest for only the most extreme acute episode. Prolonged bed rest may aggravate your condition. Ice used for acute conditions is effective. Use a large plastic bag filled with ice. Wrap it in a towel. This also provides excellent pain relief. This may be continuous. Or use it for 30 minutes every 2 hours during acute phase, then as needed. Heat for 30 minutes prior to activities is helpful.   As soon as the acute phase (the time when your back is too painful for you to do normal activities) is over, it is important to resume normal activities and work Arboriculturist. Back injuries can cause potentially marked changes in lifestyle. So it is important to attack these problems aggressively.   See your caregiver for continued problems. He or she can help or refer you for appropriate exercises, physical therapy and work hardening if needed.   If you are given narcotic medications for your condition, for the next 24 hours DO NOT:   Drive   Operate machinery or power tools.   Sign  legal documents.   DO NOT drink alcohol, take sleeping pills or other medications that may interfere with treatment.  If your caregiver has given you a follow-up appointment, it is very important to keep that appointment. Not keeping the appointment could result in a chronic or permanent injury, pain, and disability. If there is any problem keeping the appointment, you must call back to this facility for assistance.   SEEK IMMEDIATE  MEDICAL CARE IF:  You develop numbness, tingling, weakness, or problems with the use of your arms or legs.   You develop severe back pain not relieved with medications.   You have changes in bowel or bladder control.   You have increasing pain in any areas of the body.  Document Released: 03/06/2005 Document Revised: 02/23/2011 Document Reviewed: 10/09/2007 [facility] Patient Information 2012 [place], [place].

## 2011-11-30 NOTE — Progress Notes (Signed)
Subjective:     Patient ID: Kaylee Davis, female   DOB: [birth year], 71 y.o.   MRN: [id number]  HPI Comments: Kaylee Davis is a 71 yo female with a history of meniscus tear in her left knee who has come in today with back pain that has persisted for three weeks. She states that the pain is between a 7 and a 9 out of 10. The pain is described as a constant pressure and located in her thoracic spine. It radiates to her chest along her ribs. It is relieved by ice packs, inspiration, and an Hydrologist. She also states that the pain improves when she sits up straight. She denies fever, chills, numbness in her legs, loss of bladder or bowel control, and SOB. She walks on a treadmill and works out with [employer]. She reports that the pain makes her feel exhausted, but she is able to perform activities of daily living. She has trouble with mobility from her left knee which has not regain full function sp surgery.  Past Medical History  Diagnosis Date  . Hypertension   . TMJ (temporomandibular joint disorder)   . Dyspepsia   . Migraine headache     much reduced frequency  . History of PSVT (paroxysmal supraventricular tachycardia)     can control by doing a valsalva. Triggered by elevated heart rate   Past Surgical History  Procedure Date  . Abdominal hysterectomy   . Arthroplasty     left Knee   . Appendectomy    Family History  Problem Relation Age of Onset  . Cancer Neg Hx     Breast/Colon  . Diabetes Neg Hx   . Heart disease Neg Hx     CAD/MI   History   Social History  . Marital Status: Married    Spouse Name: N/A    Number of Children: 2  . Years of Education: 12   Occupational History  .      retired   Social History Main Topics  . Smoking status: Never Smoker   . Smokeless tobacco: Never Used  . Alcohol Use: 2.5 oz/week    5 drink(s) per week  . Drug Use: No  . Sexually Active: Yes -- Female partner(s)   Other Topics Concern  . Not on file   Social  History Narrative   [employer], married 1958 2 sons 1961 and 1963, 1 grandson 91. End of life issues - materials provided for contemplation. Still in the contemplative.    Current Outpatient Prescriptions on File Prior to Visit  Medication Sig Dispense Refill  . amLODipine (NORVASC) 10 MG tablet Take 1 tablet (10 mg total) by mouth daily.  90 tablet  3  . Calcium Carbonate-Vit D-Min (CALCIUM 1200) 1200-1000 MG-UNIT CHEW Chew by mouth.        . CELEBREX 100 MG capsule TAKE 1 CAPSULE ONCE DAILY AS NEEDED  90 capsule  3  . esomeprazole (NEXIUM) 40 MG capsule Take 1 capsule (40 mg total) by mouth daily before breakfast.  90 capsule  3  . estrogens, conjugated, (PREMARIN) 0.625 MG tablet Take 1 tablet (0.625 mg total) by mouth daily. Take daily for 21 days then do not take for 7 days.  90 tablet  3  . furosemide (LASIX) 40 MG tablet Take 1 tablet (40 mg total) by mouth daily.  90 tablet  3  . cyanocobalamin 1000 MCG tablet Take 100 mcg by mouth daily.  Review of Systems  All other systems reviewed and are negative.       Objective:   Physical Exam  Constitutional: She is oriented to person, place, and time. No distress.  HENT:  Head: Atraumatic.  Neck: Normal range of motion.  Cardiovascular: Normal rate, regular rhythm and normal heart sounds.  Exam reveals no gallop and no friction rub.   No murmur heard.      2+ carotid pulses bl. 2+ radial pulses bl.  Pulmonary/Chest: Effort normal and breath sounds normal. No respiratory distress. She has no wheezes. She has no rales.       Equal chest expansion  Musculoskeletal:       Normal ROM in back. No increased pain on side to side neck motion. Releif of pain noted on neck and back extension. No change in pain on flexion of spine. No increased pain from rotation of spine. Pain on palpation of T3 to T6 slightly to the right of the spine. No spinal anomalies palpated.   Neurological: She is alert and oriented to person,  place, and time.       Sensation to soft touch and pinprick in tact in legs. Normal deep tendon reflexes.  Skin: She is not diaphoretic.       No lacerations or signs of trauma on back.  Psychiatric: She has a normal mood and affect. Her behavior is normal. Judgment and thought content normal.   Filed Vitals:   11/30/11 1113  BP: 140/100  Pulse: 78  Temp: 98.3 F (36.8 C)  Resp: 16        Assessment & Plan:     1. Back Pain - This can be a compression fracture or a muscle sprain.  PLAN - Obtain x-ray films of the thoracic spine. Treat pending results of x-ray.  Addendum: THORACIC SPINE - 2 VIEW  Comparison: None  Findings: Spondylitic changes noted in the lower cervical spine C4-  C7. There is no evidence of thoracic spine fracture. Alignment is  normal. No other significant bone abnormalities are identified.  IMPRESSION:  Negative thoracic spine  Attending note: patient interviewed and examined: Back exam: normal stand; normal flex to greater than 100 degrees; normal gait; normal toe/heel walk; normal step up to exam table; normal SLR sitting; normal DTRs at the patellar tendons; normal sensation to light touch, pin-prick and deep vibratory stimulus; no  CVA tenderness; able to move supine to sitting witout assistance.  Agree with assessment per Mr. Kai Levins, MS III. With normal thoracic spine films will recommend back rest, flex-stretch, NSAIDs.

## 2011-12-05 ENCOUNTER — Ambulatory Visit: Payer: Medicare Other | Admitting: Internal Medicine

## 2011-12-11 ENCOUNTER — Other Ambulatory Visit: Payer: Self-pay | Admitting: *Deleted

## 2011-12-11 MED ORDER — CELECOXIB 100 MG PO CAPS: 100.0000 mg | ORAL_CAPSULE | Freq: Every day | ORAL | Status: DC

## 2011-12-13 ENCOUNTER — Other Ambulatory Visit: Payer: Self-pay | Admitting: *Deleted

## 2011-12-13 MED ORDER — CELECOXIB 100 MG PO CAPS: 100.0000 mg | ORAL_CAPSULE | Freq: Every day | ORAL | Status: DC

## 2011-12-13 NOTE — Telephone Encounter (Signed)
PATIENT STATES GET MEDICATION FROM [facility] NOW.

## 2012-01-04 ENCOUNTER — Ambulatory Visit (INDEPENDENT_AMBULATORY_CARE_PROVIDER_SITE_OTHER): Payer: Medicare Other

## 2012-01-04 ENCOUNTER — Ambulatory Visit: Payer: Medicare Other

## 2012-01-04 DIAGNOSIS — Z23 Encounter for immunization: Secondary | ICD-10-CM

## 2012-01-26 ENCOUNTER — Other Ambulatory Visit: Payer: Self-pay | Admitting: *Deleted

## 2012-01-26 MED ORDER — ESOMEPRAZOLE MAGNESIUM 40 MG PO CPDR: 40.0000 mg | DELAYED_RELEASE_CAPSULE | Freq: Every day | ORAL | Status: DC

## 2012-01-26 MED ORDER — AMLODIPINE BESYLATE 10 MG PO TABS: 10.0000 mg | ORAL_TABLET | Freq: Every day | ORAL | Status: DC

## 2012-01-26 MED ORDER — FUROSEMIDE 40 MG PO TABS: 40.0000 mg | ORAL_TABLET | Freq: Every day | ORAL | Status: DC

## 2012-02-05 ENCOUNTER — Ambulatory Visit (INDEPENDENT_AMBULATORY_CARE_PROVIDER_SITE_OTHER): Payer: Medicare Other | Admitting: Internal Medicine

## 2012-02-05 ENCOUNTER — Encounter: Payer: Self-pay | Admitting: Internal Medicine

## 2012-02-05 VITALS — BP 126/68 | HR 77 | Temp 97.1°F | Resp 10 | Wt 170.1 lb

## 2012-02-05 DIAGNOSIS — M1990 Unspecified osteoarthritis, unspecified site: Secondary | ICD-10-CM

## 2012-02-05 DIAGNOSIS — Z9889 Other specified postprocedural states: Secondary | ICD-10-CM

## 2012-02-05 NOTE — Patient Instructions (Addendum)
When your activities of daily living are starting to be curtailed it is time to think about having intervention. Dr. Lequita Halt is very experienced, an excellent surgeon and the OR team is also very experienced, all this leading to low rate of complications and good outcomes. The rehab for total knee replacement is arduous but the end result is great.  If you move forward with surery I am happy to provide pre-op evaluation and clearance and to attend to you while you are in the hospital

## 2012-02-06 NOTE — Progress Notes (Signed)
  Subjective:    Patient ID: Kaylee Davis, female    DOB: [birth year], 71 y.o.   MRN: [id number]  HPI [name] is having increasing limitations in activity due to progressive OA knees, especially the left. She is contemplating surgery and presents to discuss this option.   PMH, FamHx and SocHx reviewed for any changes and relevance. Current Outpatient Prescriptions on File Prior to Visit  Medication Sig Dispense Refill  . amLODipine (NORVASC) 10 MG tablet Take 1 tablet (10 mg total) by mouth daily.  90 tablet  3  . Calcium Carbonate-Vit D-Min (CALCIUM 1200) 1200-1000 MG-UNIT CHEW Chew by mouth.        . celecoxib (CELEBREX) 100 MG capsule Take 1 capsule (100 mg total) by mouth daily. As needed  90 capsule  3  . cyanocobalamin 1000 MCG tablet Take 100 mcg by mouth daily.        Marland Kitchen esomeprazole (NEXIUM) 40 MG capsule Take 1 capsule (40 mg total) by mouth daily before breakfast.  90 capsule  3  . estrogens, conjugated, (PREMARIN) 0.625 MG tablet Take 1 tablet (0.625 mg total) by mouth daily. Take daily for 21 days then do not take for 7 days.  90 tablet  3  . furosemide (LASIX) 40 MG tablet Take 1 tablet (40 mg total) by mouth daily.  90 tablet  3      Review of Systems System review is negative for any constitutional, cardiac, pulmonary, GI or neuro symptoms or complaints other than as described in the HPI.     Objective:   Physical Exam Filed Vitals:   02/05/12 0959  BP: 126/68  Pulse: 77  Temp: 97.1 F (36.2 C)  Resp: 10   Gen'l- mildly overweight white woman in no distress Cor- RRR PUlm - normal MSK - halting gait, valgus deformity left knee       Assessment & Plan:

## 2012-02-06 NOTE — Assessment & Plan Note (Signed)
Progress knee pain and instability limiting her activities. Discussed TKR. She has previously seen Dr. Lequita Halt  Plan - referral to Dr. Lequita Halt for evaluation for possible TKR.

## 2012-04-11 ENCOUNTER — Encounter: Payer: Self-pay | Admitting: Internal Medicine

## 2012-05-30 ENCOUNTER — Ambulatory Visit (INDEPENDENT_AMBULATORY_CARE_PROVIDER_SITE_OTHER): Payer: Medicare Other | Admitting: Internal Medicine

## 2012-05-30 ENCOUNTER — Encounter: Payer: Self-pay | Admitting: Internal Medicine

## 2012-05-30 VITALS — BP 132/82 | HR 60 | Temp 97.7°F

## 2012-05-30 DIAGNOSIS — S239XXA Sprain of unspecified parts of thorax, initial encounter: Secondary | ICD-10-CM

## 2012-05-30 DIAGNOSIS — S29012A Strain of muscle and tendon of back wall of thorax, initial encounter: Secondary | ICD-10-CM

## 2012-05-30 MED ORDER — TRAMADOL HCL 50 MG PO TABS: 50.0000 mg | ORAL_TABLET | Freq: Three times a day (TID) | ORAL | Status: DC | PRN

## 2012-05-30 MED ORDER — CYCLOBENZAPRINE HCL 5 MG PO TABS: 5.0000 mg | ORAL_TABLET | Freq: Three times a day (TID) | ORAL | Status: DC | PRN

## 2012-05-30 NOTE — Patient Instructions (Signed)
Muscle Strain °Muscle strain occurs when a muscle is stretched beyond its normal length. A small number of muscle fibers generally are torn. This is especially common in athletes. This happens when a sudden, violent force placed on a muscle stretches it too far. Usually, recovery from muscle strain takes 1 to 2 weeks. Complete healing will take 5 to 6 weeks.  °HOME CARE INSTRUCTIONS  °· While awake, apply ice to the sore muscle for the first 2 days after the injury. °· Put ice in a plastic bag. °· Place a towel between your skin and the bag. °· Leave the ice on for 15 to 20 minutes each hour. °· Do not use the strained muscle for several days, until you no longer have pain. °· You may wrap the injured area with an elastic bandage for comfort. Be careful not to wrap it too tightly. This may interfere with blood circulation or increase swelling. °· Only take over-the-counter or prescription medicines for pain, discomfort, or fever as directed by your caregiver. °SEEK MEDICAL CARE IF:  °You have increasing pain or swelling in the injured area. °MAKE SURE YOU:  °· Understand these instructions. °· Will watch your condition. °· Will get help right away if you are not doing well or get worse. °Document Released: 03/06/2005 Document Revised: 05/29/2011 Document Reviewed: 03/18/2011 °[employer]® Patient Information ©2013 [employer]. ° °

## 2012-05-30 NOTE — Progress Notes (Signed)
Subjective:    Patient ID: Kaylee Davis, female    DOB: 02-05-1941, 72 y.o.   MRN: [id number]  HPI  Pt presents to the clinic today with c/o a pulled muscle in her back. The pain is worse when she twist or bend over. She has taken Advil with only minimal relief. She does have pain and arthritis in her left knee and is expecting to have a knee replacement in June 2014. However, for know she is having to use a lot of upper body strength to get herself from a sitting to a standing position. She has had pain like this in the past back in 11/2011. She was treated with a muscle relaxer which worked very well for her. She states this feels exactly the same. She denies loss of bowel or bladder control.  Review of Systems      Past Medical History  Diagnosis Date  . Hypertension   . TMJ (temporomandibular joint disorder)   . Dyspepsia   . Migraine headache     much reduced frequency  . History of PSVT (paroxysmal supraventricular tachycardia)     can control by doing a valsalva. Triggered by elevated heart rate    Current Outpatient Prescriptions  Medication Sig Dispense Refill  . amLODipine (NORVASC) 10 MG tablet Take 1 tablet (10 mg total) by mouth daily.  90 tablet  3  . celecoxib (CELEBREX) 100 MG capsule Take 1 capsule (100 mg total) by mouth daily. As needed  90 capsule  3  . cyanocobalamin 1000 MCG tablet Take 100 mcg by mouth daily.        Marland Kitchen esomeprazole (NEXIUM) 40 MG capsule Take 1 capsule (40 mg total) by mouth daily before breakfast.  90 capsule  3  . estrogens, conjugated, (PREMARIN) 0.625 MG tablet Take 1 tablet (0.625 mg total) by mouth daily. Take daily for 21 days then do not take for 7 days.  90 tablet  3  . furosemide (LASIX) 40 MG tablet Take 1 tablet (40 mg total) by mouth daily.  90 tablet  3  . Calcium Carbonate-Vit D-Min (CALCIUM 1200) 1200-1000 MG-UNIT CHEW Chew by mouth.        . cyclobenzaprine (FLEXERIL) 5 MG tablet Take 1 tablet (5 mg total) by mouth 3 (three)  times daily as needed for muscle spasms.  30 tablet  1  . traMADol (ULTRAM) 50 MG tablet Take 1 tablet (50 mg total) by mouth every 8 (eight) hours as needed for pain.  30 tablet  0   No current facility-administered medications for this visit.    Allergies  Allergen Reactions  . Penicillins   . Sulfonamide Derivatives     Family History  Problem Relation Age of Onset  . Cancer Neg Hx     Breast/Colon  . Diabetes Neg Hx   . Heart disease Neg Hx     CAD/MI    History   Social History  . Marital Status: Married    Spouse Name: N/A    Number of Children: 2  . Years of Education: 12   Occupational History  .      retired   Social History Main Topics  . Smoking status: Never Smoker   . Smokeless tobacco: Never Used  . Alcohol Use: 2.5 oz/week    5 drink(s) per week  . Drug Use: No  . Sexually Active: Yes -- Female partner(s)   Other Topics Concern  . Not on file   Social History Narrative  [employer], married 1958 2 sons 1961 and 1963, 1 grandson 77. End of life issues - materials provided for contemplation. Still in the contemplative.     Constitutional: Denies fever, malaise, fatigue, headache or abrupt weight changes.  Respiratory: Denies difficulty breathing, shortness of breath, cough or sputum production.   Cardiovascular: Denies chest pain, chest tightness, palpitations or swelling in the hands or feet.  GU: Denies urgency, frequency, pain with urination, burning sensation, blood in urine, odor or discharge. Musculoskeletal: Pt reports pain in her back. Denies decrease in range of motion, difficulty with gait,  or joint pain and swelling.  Neurological: Denies dizziness, difficulty with memory, difficulty with speech or problems with balance and coordination.   No other specific complaints in a complete review of systems (except as listed in HPI above).  Objective:   Physical Exam  BP 132/82  Pulse 60  Temp(Src) 97.7 F (36.5 C) (Oral)   SpO2 99% Wt Readings from Last 3 Encounters:  02/05/12 170 lb 1.9 oz (77.166 kg)  11/30/11 170 lb (77.111 kg)  01/31/11 166 lb (75.297 kg)    General: Appears her stated age, well developed, well nourished in NAD. Cardiovascular: Normal rate and rhythm. S1,S2 noted.  No murmur, rubs or gallops noted. No JVD or BLE edema. No carotid bruits noted. Pulmonary/Chest: Normal effort and positive vesicular breath sounds. No respiratory distress. No wheezes, rales or ronchi noted.  Abdomen: Soft and nontender. Normal bowel sounds, no bruits noted. No distention or masses noted. Liver, spleen and kidneys non palpable. No  CVA tenderness Musculoskeletal: Decreased flexion and lateral rotation of the back secondary to muscular pain. No signs of joint swelling. No difficulty with gait.  Neurological: Alert and oriented. Cranial nerves II-XII intact. Coordination normal. +DTRs bilaterally.     Assessment & Plan:   Muscle strain of back:  Perform Stretching exercises as indicated on handout eRx for Flexeril 5 mg TID prn eRx for tramadol for pain  RTC if symptoms persist or worsen

## 2012-06-05 ENCOUNTER — Telehealth: Payer: Self-pay

## 2012-06-05 NOTE — Telephone Encounter (Signed)
Pt calls stating she is having knee surgery with Dr Lequita Halt on June 2. She is seeing his PA on May 12 and a letter of medical clearance is needed by this date. I left a message for Dr Aluisio's scheduler, Toniann Fail, to see if that has been received and I let pt know I am waiting on her call back. Dr Debby Bud says he believes this was done but I don't see it in pt's chart.

## 2012-07-16 ENCOUNTER — Encounter (HOSPITAL_COMMUNITY): Payer: Self-pay | Admitting: Pharmacy Technician

## 2012-07-16 ENCOUNTER — Other Ambulatory Visit: Payer: Self-pay | Admitting: Orthopedic Surgery

## 2012-07-16 MED ORDER — DEXAMETHASONE SODIUM PHOSPHATE 10 MG/ML IJ SOLN: 10.0000 mg | Freq: Once | INTRAMUSCULAR | Status: DC

## 2012-07-16 MED ORDER — BUPIVACAINE LIPOSOME 1.3 % IJ SUSP: 20.0000 mL | Freq: Once | INTRAMUSCULAR | Status: DC

## 2012-07-16 NOTE — Progress Notes (Signed)
Preoperative surgical orders have been place into the Epic hospital system for Kaylee Davis on 07/16/2012, 5:26 PM  by Patrica Duel for surgery on 07/29/2012.  Preop Total Knee orders including Experal, IV Tylenol, and IV Decadron as long as there are no contraindications to the above medications. Kaylee Peace, PA-C

## 2012-07-16 NOTE — Progress Notes (Signed)
Need PRE OP ORDERS PLEASE  THANKS-  PST APPT 07/19/12

## 2012-07-19 ENCOUNTER — Encounter (HOSPITAL_COMMUNITY)
Admission: RE | Admit: 2012-07-19 | Discharge: 2012-07-19 | Disposition: A | Discharge status: Home / Self Care | Payer: Medicare Other | Source: Ambulatory Visit | Attending: Orthopedic Surgery | Admitting: Orthopedic Surgery

## 2012-07-19 ENCOUNTER — Encounter (HOSPITAL_COMMUNITY): Payer: Self-pay

## 2012-07-19 ENCOUNTER — Ambulatory Visit (HOSPITAL_COMMUNITY)
Admission: RE | Admit: 2012-07-19 | Discharge: 2012-07-19 | Disposition: A | Discharge status: Home / Self Care | Payer: Medicare Other | Source: Ambulatory Visit | Attending: Orthopedic Surgery | Admitting: Orthopedic Surgery

## 2012-07-19 DIAGNOSIS — I10 Essential (primary) hypertension: Secondary | ICD-10-CM | POA: Insufficient documentation

## 2012-07-19 DIAGNOSIS — M1710 Unilateral primary osteoarthritis, unspecified knee: Secondary | ICD-10-CM | POA: Insufficient documentation

## 2012-07-19 DIAGNOSIS — Z01810 Encounter for preprocedural cardiovascular examination: Secondary | ICD-10-CM | POA: Insufficient documentation

## 2012-07-19 DIAGNOSIS — Z01818 Encounter for other preprocedural examination: Secondary | ICD-10-CM | POA: Insufficient documentation

## 2012-07-19 DIAGNOSIS — Z01812 Encounter for preprocedural laboratory examination: Secondary | ICD-10-CM | POA: Insufficient documentation

## 2012-07-19 HISTORY — DX: Gastrointestinal hemorrhage, unspecified: K92.2

## 2012-07-19 HISTORY — DX: Gastro-esophageal reflux disease without esophagitis: K21.9

## 2012-07-19 HISTORY — DX: Unspecified osteoarthritis, unspecified site: M19.90

## 2012-07-19 LAB — CBC
HCT: 40.6 % (ref 36.0–46.0)
Hemoglobin: 13.6 g/dL (ref 12.0–15.0)
MCH: 30.2 pg (ref 26.0–34.0)
MCHC: 33.5 g/dL (ref 30.0–36.0)
RBC: 4.51 MIL/uL (ref 3.87–5.11)

## 2012-07-19 LAB — COMPREHENSIVE METABOLIC PANEL
ALT: 9 U/L (ref 0–35)
Alkaline Phosphatase: 94 U/L (ref 39–117)
BUN: 13 mg/dL (ref 6–23)
CO2: 29 mEq/L (ref 19–32)
GFR calc Af Amer: >90 mL/min (ref >90)
GFR calc non Af Amer: 88 mL/min — ABNORMAL LOW (ref >90)
Glucose, Bld: 103 mg/dL — ABNORMAL HIGH (ref 70–99)
Potassium: 4.7 mEq/L (ref 3.5–5.1)
Sodium: 139 mEq/L (ref 135–145)
Total Protein: 7.2 g/dL (ref 6.0–8.3)

## 2012-07-19 LAB — SURGICAL PCR SCREEN
MRSA, PCR: INVALID — AB
Staphylococcus aureus: INVALID — AB

## 2012-07-19 LAB — PROTIME-INR: Prothrombin Time: 12 seconds (ref 11.6–15.2)

## 2012-07-19 LAB — URINALYSIS, ROUTINE W REFLEX MICROSCOPIC
Bilirubin Urine: NEGATIVE
Glucose, UA: NEGATIVE mg/dL
Hgb urine dipstick: NEGATIVE
Specific Gravity, Urine: 1.009 (ref 1.005–1.030)
pH: 5 (ref 5.0–8.0)

## 2012-07-19 LAB — ABO/RH: ABO/RH(D): O POS

## 2012-07-19 LAB — APTT: aPTT: 30 seconds (ref 24–37)

## 2012-07-19 NOTE — Pre-Procedure Instructions (Signed)
EKG AND CXR WERE DONE TODAY AT [facility] - PREOP. 

## 2012-07-19 NOTE — Patient Instructions (Addendum)
YOUR SURGERY IS SCHEDULED AT [facility]  ON:  Monday  5/12  REPORT TO [place] [facility] AT:  6:00 AM      PHONE # FOR SHORT STAY IS [phone]  DO NOT EAT OR DRINK ANYTHING AFTER MIDNIGHT THE NIGHT BEFORE YOUR SURGERY.  YOU MAY BRUSH YOUR TEETH, RINSE OUT YOUR MOUTH--BUT NO WATER, NO FOOD, NO CHEWING GUM, NO MINTS, NO CANDIES, NO CHEWING TOBACCO.  PLEASE TAKE THE FOLLOWING MEDICATIONS THE AM OF YOUR SURGERY WITH A FEW SIPS OF WATER:  AMLODIPINE, NEXIUM    DO NOT BRING VALUABLES, MONEY, CREDIT CARDS.  DO NOT WEAR JEWELRY, MAKE-UP, NAIL POLISH AND NO METAL PINS OR CLIPS IN YOUR HAIR. CONTACT LENS, DENTURES / PARTIALS, GLASSES SHOULD NOT BE WORN TO SURGERY AND IN MOST CASES-HEARING AIDS WILL NEED TO BE REMOVED.  BRING YOUR GLASSES CASE, ANY EQUIPMENT NEEDED FOR YOUR CONTACT LENS. FOR PATIENTS ADMITTED TO THE HOSPITAL--CHECK OUT TIME THE DAY OF DISCHARGE IS 11:00 AM.  ALL INPATIENT ROOMS ARE PRIVATE - WITH BATHROOM, TELEPHONE, TELEVISION AND WIFI INTERNET.                                PLEASE READ OVER ANY  FACT SHEETS THAT YOU WERE GIVEN: MRSA INFORMATION, BLOOD TRANSFUSION INFORMATION, INCENTIVE SPIROMETER INFORMATION. FAILURE TO FOLLOW THESE INSTRUCTIONS MAY RESULT IN THE CANCELLATION OF YOUR SURGERY.   PATIENT SIGNATURE_________________________________

## 2012-07-21 LAB — MRSA CULTURE

## 2012-07-28 ENCOUNTER — Other Ambulatory Visit: Payer: Self-pay | Admitting: Orthopedic Surgery

## 2012-07-28 MED ORDER — TRANEXAMIC ACID 100 MG/ML IV SOLN: 1000.0000 mg | INTRAVENOUS | Status: DC

## 2012-07-28 NOTE — H&P (Signed)
Kaylee Davis  DOB: Oct 21, 1940 Married / Language: English / Race: White Female  Date of Admission:  07/29/2012  Chief Complaint:  Left Knee Pain  History of Present Illness The patient is a 72 year old female who comes in for a preoperative History and Physical. The patient is scheduled for a left total knee arthroplasty to be performed by Dr. Gus Rankin. Aluisio, MD at [facility] on 08/19/2012. The patient is a 72 year old female who presents for follow up of their knee. The patient is being followed for their bilateral knee pain and osteoarthritis. They are now month(s) out from cortisone injection (left knee only). Symptoms reported today include: pain, stiffness and locking. The patient feels that they are doing well and report their pain level to be moderate. Current treatment includes: home exercise program and Celebrex. The following medication has been used for pain control: none. The patient has reported improvement of their symptoms with: Cortisone injections. The patient indicates that they have questions or concerns today regarding pain, their progress at this point and is it time for surgery.] Kaylee Davis did get some releif with her cortisone shot but is has finally worn off. Her knee has continued to get worse over time. It has started to affect her quality of life. It has become more difficult to get around. She feels a grabbing, locking sensation with the knee. It has become more difficult to get up and down from a seated position and to bend over to pick something up. She has to use a cane when she is out and about. Uneven surfaces have become more difficult also. She feels that she has lost a lot of her freedom to do things. She has become more afraid to go out on her own now. It is more difficult to go up and down stairs. She denies swelling with the knee. The patient feels like the knee has essentially taken over her whole life at this point. She  is unable to do things that she desires. She really has to rely on her husband to be there and to hold onto him when she is walking because she is concerned that the knee is giving out. She is ready to proceed with surgical intervention on the left knee. They have been treated conservatively in the past for the above stated problem and despite conservative measures, they continue to have progressive pain and severe functional limitations and dysfunction. They have failed non-operative management including home exercise, medications, and injections. It is felt that they would benefit from undergoing total joint replacement. Risks and benefits of the procedure have been discussed with the patient and they elect to proceed with surgery. There are no active contraindications to surgery such as ongoing infection or rapidly progressive neurological disease.   Problem List Primary osteoarthritis of both knees     Allergies Sulfanilamide *CHEMICALS* Penicillin VK *PENICILLINS*. with red dye   Family History Family history unknown - Adopted   Social History Tobacco use. never smoker Marital status. married Pain Contract. no Tobacco / smoke exposure. no Alcohol use. current drinker; drinks wine; only occasionally per week Children. 2 Exercise. does running / walking and gym / weights Illicit drug use. no Living situation. live with spouse Current work status. retired Financial planner (Currently). no Drug/Alcohol Rehab (Previously). no Post-Surgical Plans. HOME   Medication History AmLODIPine Besylate (10MG  Tablet, Oral) Active. CeleBREX (100MG  Capsule, Oral) Active. Furosemide (40MG  Tablet, Oral) Active. NexIUM (40MG  Capsule DR, Oral) Active. Premarin (  0.625MG  Tablet, Oral) Active.   Past Surgical History Appendectomy Hemorrhoidectomy Hysterectomy. complete (non-cancerous) Tonsillectomy   Medical History High blood pressure Gastroesophageal Reflux  Disease Migraine Headache Impaired Vision Measles Mumps Menopause PVC (premature ventricular contraction) (427.69) Duodenal ulcer (532.90) Acute duodenal ulcer with bleeding (532.00)  General: No chills, fevers, night sweats, fatigue. Neuro:  No seizures, syncope, paralysis, visual problems. Respiratory:  No shortness of breath, productive cough, hemoptysis. Cardiovascular: No chest pain, angina, palpitations, orthopnea. GI: No nausea, vomiting, diarrhea, constipation. GU:  No dysuria, hematuria, discharge. Musculoskeletal:  Joint pain   Vitals Weight: 167 lb Height: 66 in Height was reported by patient. Body Surface Area: 1.88 m Body Mass Index: 26.95 kg/m Pulse: 76 (Regular) Resp.: 14 (Unlabored) BP: 148/78 (Sitting, Right Arm, Standard)    Physical Exam The physical exam findings are as follows:  Note: Patient is a 72 year old female with continued knee pain. Patient is accompanied today by her husband.   General Mental Status - Alert, cooperative and good historian. General Appearance- pleasant. Not in acute distress. Orientation- Oriented X3. Build & Nutrition- Well nourished and Well developed.   Head and Neck Head- normocephalic, atraumatic . Neck Global Assessment- supple. no bruit auscultated on the right and no bruit auscultated on the left.   Eye Vision- Wears corrective lenses. Pupil- Bilateral- Regular and Round. Motion- Bilateral- EOMI.   Chest and Lung Exam Auscultation: Breath sounds:- clear at anterior chest wall and - clear at posterior chest wall. Adventitious sounds:- No Adventitious sounds.   Cardiovascular Auscultation:Rhythm- Regular rate and rhythm. Heart Sounds- S1 WNL and S2 WNL. Murmurs & Other Heart Sounds:Auscultation of the heart reveals - No Murmurs.   Abdomen Palpation/Percussion:Tenderness- Abdomen is non-tender to palpation. Rigidity (guarding)- Abdomen is  soft. Auscultation:Auscultation of the abdomen reveals - Bowel sounds normal.   Female Genitourinary  Not done, not pertinent to present illness  Musculoskeletal Both knees are examined. Right knee: She's got about an 8 degree valgus mal-alignment deformity although her motion is excellent with 0 degrees of extension, flexion back to 130 actively and 135 passively. No effusion noted. She's got moderate crepitus noted on the PROM. Left knee is a little bit worse. She's got about a 13 degree valgus mal-alignment deformity, ROM very good of 0-125 active, 130 passive. She is tender to palpation a little bit over the lateral vs. the medial joint line. Marked crepitus noted on PROM.  RADIOGRAPHS: AP standing view of both knees and lateral views. The right knee shows significant joint loss with near bone on bone in the lateral compartment with femoral and tibial spurring and, on the lateral view, it shows where she is near bone on bone. AP view of the left knee shows complete loss of the joint space, lateral compartment, with sclerotic changes. On the lateral view of the left knee, it again confirms the bone on bone.  Assessment & Plan Primary osteoarthritis of both knees (715.16) Impression: Left Knee  Note: Plan is for a Left Total Knee Replacement by Dr. Lequita Halt.  Plan is to go home.  PCP - Dr. Debby Bud - Patient has been seen preoperatively and felt to be stable for surgery.  Signed electronically by Roberts Gaudy, PA-C

## 2012-07-29 ENCOUNTER — Encounter (HOSPITAL_COMMUNITY): Payer: Self-pay | Admitting: Anesthesiology

## 2012-07-29 ENCOUNTER — Encounter (HOSPITAL_COMMUNITY)
Admission: RE | Disposition: A | Discharge status: Home Health | Payer: Self-pay | Source: Ambulatory Visit | Attending: Orthopedic Surgery

## 2012-07-29 ENCOUNTER — Encounter (HOSPITAL_COMMUNITY): Payer: Self-pay | Admitting: *Deleted

## 2012-07-29 ENCOUNTER — Inpatient Hospital Stay (HOSPITAL_COMMUNITY)
Admission: RE | Admit: 2012-07-29 | Discharge: 2012-07-31 | DRG: 470 | Disposition: A | Discharge status: Home Health | Payer: Medicare Other | Source: Ambulatory Visit | Attending: Orthopedic Surgery | Admitting: Orthopedic Surgery

## 2012-07-29 ENCOUNTER — Inpatient Hospital Stay (HOSPITAL_COMMUNITY): Payer: Medicare Other | Admitting: Anesthesiology

## 2012-07-29 DIAGNOSIS — D62 Acute posthemorrhagic anemia: Secondary | ICD-10-CM

## 2012-07-29 DIAGNOSIS — I10 Essential (primary) hypertension: Secondary | ICD-10-CM | POA: Diagnosis present

## 2012-07-29 DIAGNOSIS — K219 Gastro-esophageal reflux disease without esophagitis: Secondary | ICD-10-CM | POA: Diagnosis present

## 2012-07-29 DIAGNOSIS — M1710 Unilateral primary osteoarthritis, unspecified knee: Principal | ICD-10-CM | POA: Diagnosis present

## 2012-07-29 DIAGNOSIS — Z96652 Presence of left artificial knee joint: Secondary | ICD-10-CM

## 2012-07-29 DIAGNOSIS — E876 Hypokalemia: Secondary | ICD-10-CM

## 2012-07-29 DIAGNOSIS — M179 Osteoarthritis of knee, unspecified: Secondary | ICD-10-CM

## 2012-07-29 HISTORY — PX: TOTAL KNEE ARTHROPLASTY: SHX125

## 2012-07-29 SURGERY — ARTHROPLASTY, KNEE, TOTAL
Anesthesia: Spinal | Site: Knee | Laterality: Left | Wound class: Clean

## 2012-07-29 MED ORDER — METOCLOPRAMIDE HCL 5 MG/ML IJ SOLN: 5.0000 mg | Freq: Three times a day (TID) | INTRAMUSCULAR | Status: DC | PRN

## 2012-07-29 MED ORDER — DEXAMETHASONE SODIUM PHOSPHATE 10 MG/ML IJ SOLN: 10.0000 mg | Freq: Every day | INTRAMUSCULAR | Status: AC

## 2012-07-29 MED ORDER — ONDANSETRON HCL 4 MG/2ML IJ SOLN
4.0000 mg | Freq: Four times a day (QID) | INTRAMUSCULAR | Status: DC | PRN
  Administered 2012-07-30: 4 mg via INTRAVENOUS
  Filled 2012-07-29: qty 2

## 2012-07-29 MED ORDER — TRANEXAMIC ACID 100 MG/ML IV SOLN
1000.0000 mg | INTRAVENOUS | Status: AC
  Administered 2012-07-29: 1000 mg via INTRAVENOUS
  Filled 2012-07-29: qty 10

## 2012-07-29 MED ORDER — PROPOFOL 10 MG/ML IV EMUL
INTRAVENOUS | Status: DC | PRN
  Administered 2012-07-29: 100 ug/kg/min via INTRAVENOUS

## 2012-07-29 MED ORDER — FUROSEMIDE 40 MG PO TABS
40.0000 mg | ORAL_TABLET | Freq: Every day | ORAL | Status: DC
  Administered 2012-07-29 – 2012-07-31 (×3): 40 mg via ORAL
  Filled 2012-07-29 (×3): qty 1

## 2012-07-29 MED ORDER — FENTANYL CITRATE 0.05 MG/ML IJ SOLN
25.0000 ug | INTRAMUSCULAR | Status: DC | PRN
  Administered 2012-07-29 (×2): 50 ug via INTRAVENOUS

## 2012-07-29 MED ORDER — DIPHENHYDRAMINE HCL 12.5 MG/5ML PO ELIX: 12.5000 mg | ORAL_SOLUTION | ORAL | Status: DC | PRN

## 2012-07-29 MED ORDER — FLEET ENEMA 7-19 GM/118ML RE ENEM: 1.0000 | ENEMA | Freq: Once | RECTAL | Status: AC | PRN

## 2012-07-29 MED ORDER — PANTOPRAZOLE SODIUM 40 MG PO TBEC
80.0000 mg | DELAYED_RELEASE_TABLET | Freq: Every day | ORAL | Status: DC
  Filled 2012-07-29: qty 2

## 2012-07-29 MED ORDER — DEXAMETHASONE 6 MG PO TABS
10.0000 mg | ORAL_TABLET | Freq: Every day | ORAL | Status: AC
  Administered 2012-07-30: 10 mg via ORAL
  Filled 2012-07-29: qty 1

## 2012-07-29 MED ORDER — BUPIVACAINE LIPOSOME 1.3 % IJ SUSP
20.0000 mL | Freq: Once | INTRAMUSCULAR | Status: DC
  Filled 2012-07-29: qty 20

## 2012-07-29 MED ORDER — SODIUM CHLORIDE 0.9 % IR SOLN
Status: DC | PRN
  Administered 2012-07-29: 1000 mL

## 2012-07-29 MED ORDER — METHOCARBAMOL 500 MG PO TABS
500.0000 mg | ORAL_TABLET | Freq: Four times a day (QID) | ORAL | Status: DC | PRN
  Administered 2012-07-29 – 2012-07-31 (×4): 500 mg via ORAL
  Filled 2012-07-29 (×5): qty 1

## 2012-07-29 MED ORDER — CEFAZOLIN SODIUM-DEXTROSE 2-3 GM-% IV SOLR
2.0000 g | INTRAVENOUS | Status: AC
  Administered 2012-07-29: 2 g via INTRAVENOUS

## 2012-07-29 MED ORDER — ONDANSETRON HCL 4 MG/2ML IJ SOLN
INTRAMUSCULAR | Status: DC | PRN
  Administered 2012-07-29: 4 mg via INTRAVENOUS

## 2012-07-29 MED ORDER — OXYCODONE HCL 5 MG PO TABS
5.0000 mg | ORAL_TABLET | ORAL | Status: DC | PRN
  Administered 2012-07-29: 10 mg via ORAL
  Administered 2012-07-29: 5 mg via ORAL
  Administered 2012-07-29 – 2012-07-31 (×11): 10 mg via ORAL
  Filled 2012-07-29 (×13): qty 2

## 2012-07-29 MED ORDER — METOCLOPRAMIDE HCL 10 MG PO TABS: 5.0000 mg | ORAL_TABLET | Freq: Three times a day (TID) | ORAL | Status: DC | PRN

## 2012-07-29 MED ORDER — LACTATED RINGERS IV SOLN
INTRAVENOUS | Status: DC | PRN
  Administered 2012-07-29 (×2): via INTRAVENOUS

## 2012-07-29 MED ORDER — BUPIVACAINE HCL 0.25 % IJ SOLN
INTRAMUSCULAR | Status: DC | PRN
  Administered 2012-07-29: 20 mL

## 2012-07-29 MED ORDER — ACETAMINOPHEN 10 MG/ML IV SOLN
1000.0000 mg | Freq: Once | INTRAVENOUS | Status: AC
  Administered 2012-07-29: 1000 mg via INTRAVENOUS

## 2012-07-29 MED ORDER — MIDAZOLAM HCL 5 MG/5ML IJ SOLN
INTRAMUSCULAR | Status: DC | PRN
  Administered 2012-07-29 (×2): 1 mg via INTRAVENOUS

## 2012-07-29 MED ORDER — SODIUM CHLORIDE 0.9 % IV SOLN: INTRAVENOUS | Status: DC

## 2012-07-29 MED ORDER — ACETAMINOPHEN 10 MG/ML IV SOLN: INTRAVENOUS | Status: DC | PRN

## 2012-07-29 MED ORDER — AMLODIPINE BESYLATE 10 MG PO TABS
10.0000 mg | ORAL_TABLET | Freq: Every morning | ORAL | Status: DC
Start: 2012-07-30 — End: 2012-07-31
  Administered 2012-07-30 – 2012-07-31 (×2): 10 mg via ORAL
  Filled 2012-07-29 (×2): qty 1

## 2012-07-29 MED ORDER — MENTHOL 3 MG MT LOZG: 1.0000 | LOZENGE | OROMUCOSAL | Status: DC | PRN

## 2012-07-29 MED ORDER — ACETAMINOPHEN 10 MG/ML IV SOLN
1000.0000 mg | Freq: Four times a day (QID) | INTRAVENOUS | Status: AC
  Administered 2012-07-29 – 2012-07-30 (×4): 1000 mg via INTRAVENOUS
  Filled 2012-07-29 (×6): qty 100

## 2012-07-29 MED ORDER — PROMETHAZINE HCL 25 MG/ML IJ SOLN: 6.2500 mg | INTRAMUSCULAR | Status: DC | PRN

## 2012-07-29 MED ORDER — RIVAROXABAN 10 MG PO TABS
10.0000 mg | ORAL_TABLET | Freq: Every day | ORAL | Status: DC
  Administered 2012-07-30 – 2012-07-31 (×2): 10 mg via ORAL
  Filled 2012-07-29 (×3): qty 1

## 2012-07-29 MED ORDER — BISACODYL 10 MG RE SUPP: 10.0000 mg | Freq: Every day | RECTAL | Status: DC | PRN

## 2012-07-29 MED ORDER — PHENOL 1.4 % MT LIQD: 1.0000 | OROMUCOSAL | Status: DC | PRN

## 2012-07-29 MED ORDER — ACETAMINOPHEN 650 MG RE SUPP: 650.0000 mg | Freq: Four times a day (QID) | RECTAL | Status: DC | PRN

## 2012-07-29 MED ORDER — SODIUM CHLORIDE 0.9 % IJ SOLN
INTRAMUSCULAR | Status: DC | PRN
  Administered 2012-07-29: 09:00:00

## 2012-07-29 MED ORDER — ACETAMINOPHEN 325 MG PO TABS: 650.0000 mg | ORAL_TABLET | Freq: Four times a day (QID) | ORAL | Status: DC | PRN

## 2012-07-29 MED ORDER — MEPERIDINE HCL 50 MG/ML IJ SOLN: 6.2500 mg | INTRAMUSCULAR | Status: DC | PRN

## 2012-07-29 MED ORDER — MORPHINE SULFATE 2 MG/ML IJ SOLN
1.0000 mg | INTRAMUSCULAR | Status: DC | PRN
  Administered 2012-07-29 – 2012-07-31 (×8): 2 mg via INTRAVENOUS
  Filled 2012-07-29 (×8): qty 1

## 2012-07-29 MED ORDER — CHLORHEXIDINE GLUCONATE 4 % EX LIQD
60.0000 mL | Freq: Once | CUTANEOUS | Status: DC
  Filled 2012-07-29: qty 60

## 2012-07-29 MED ORDER — TRAMADOL HCL 50 MG PO TABS: 50.0000 mg | ORAL_TABLET | Freq: Four times a day (QID) | ORAL | Status: DC | PRN

## 2012-07-29 MED ORDER — METHOCARBAMOL 100 MG/ML IJ SOLN: 500.0000 mg | Freq: Four times a day (QID) | INTRAVENOUS | Status: DC | PRN

## 2012-07-29 MED ORDER — LACTATED RINGERS IV SOLN
INTRAVENOUS | Status: DC
  Administered 2012-07-29: 1000 via INTRAVENOUS

## 2012-07-29 MED ORDER — POLYETHYLENE GLYCOL 3350 17 G PO PACK: 17.0000 g | PACK | Freq: Every day | ORAL | Status: DC | PRN

## 2012-07-29 MED ORDER — BUPIVACAINE IN DEXTROSE 0.75-8.25 % IT SOLN
INTRATHECAL | Status: DC | PRN
  Administered 2012-07-29: 1.6 mL via INTRATHECAL

## 2012-07-29 MED ORDER — SODIUM CHLORIDE 0.9 % IV SOLN
INTRAVENOUS | Status: DC
  Administered 2012-07-29: 23:00:00 via INTRAVENOUS

## 2012-07-29 MED ORDER — FENTANYL CITRATE 0.05 MG/ML IJ SOLN
INTRAMUSCULAR | Status: DC | PRN
  Administered 2012-07-29: 100 ug via INTRAVENOUS

## 2012-07-29 MED ORDER — DOCUSATE SODIUM 100 MG PO CAPS
100.0000 mg | ORAL_CAPSULE | Freq: Two times a day (BID) | ORAL | Status: DC
  Administered 2012-07-29 – 2012-07-31 (×5): 100 mg via ORAL

## 2012-07-29 MED ORDER — CEFAZOLIN SODIUM 1-5 GM-% IV SOLN
1.0000 g | Freq: Four times a day (QID) | INTRAVENOUS | Status: AC
  Administered 2012-07-29 (×2): 1 g via INTRAVENOUS
  Filled 2012-07-29 (×2): qty 50

## 2012-07-29 MED ORDER — ONDANSETRON HCL 4 MG PO TABS: 4.0000 mg | ORAL_TABLET | Freq: Four times a day (QID) | ORAL | Status: DC | PRN

## 2012-07-29 SURGICAL SUPPLY — 57 items
BAG SPEC THK2 15X12 ZIP CLS (MISCELLANEOUS) ×1
BAG ZIPLOCK 12X15 (MISCELLANEOUS) ×2 IMPLANT
BANDAGE ELASTIC 6 VELCRO ST LF (GAUZE/BANDAGES/DRESSINGS) ×2 IMPLANT
BANDAGE ESMARK 6X9 LF (GAUZE/BANDAGES/DRESSINGS) ×1 IMPLANT
BLADE SAG 18X100X1.27 (BLADE) ×2 IMPLANT
BLADE SAW SGTL 11.0X1.19X90.0M (BLADE) ×2 IMPLANT
BNDG CMPR 9X6 STRL LF SNTH (GAUZE/BANDAGES/DRESSINGS) ×1
BNDG ESMARK 6X9 LF (GAUZE/BANDAGES/DRESSINGS) ×2
BOWL SMART MIX CTS (DISPOSABLE) ×2 IMPLANT
CEMENT HV SMART SET (Cement) ×4 IMPLANT
CLOSURE STERI-STRIP 1/4X4 (GAUZE/BANDAGES/DRESSINGS) ×2 IMPLANT
CLOTH BEACON ORANGE TIMEOUT ST (SAFETY) ×2 IMPLANT
CUFF TOURN SGL QUICK 34 (TOURNIQUET CUFF) ×2
CUFF TRNQT CYL 34X4X40X1 (TOURNIQUET CUFF) ×1 IMPLANT
DECANTER SPIKE VIAL GLASS SM (MISCELLANEOUS) ×2 IMPLANT
DRAPE EXTREMITY T 121X128X90 (DRAPE) ×2 IMPLANT
DRAPE POUCH INSTRU U-SHP 10X18 (DRAPES) ×2 IMPLANT
DRAPE U-SHAPE 47X51 STRL (DRAPES) ×2 IMPLANT
DRSG ADAPTIC 3X8 NADH LF (GAUZE/BANDAGES/DRESSINGS) ×2 IMPLANT
DRSG PAD ABDOMINAL 8X10 ST (GAUZE/BANDAGES/DRESSINGS) ×2 IMPLANT
DURAPREP 26ML APPLICATOR (WOUND CARE) ×2 IMPLANT
ELECT REM PT RETURN 9FT ADLT (ELECTROSURGICAL) ×2
ELECTRODE REM PT RTRN 9FT ADLT (ELECTROSURGICAL) ×1 IMPLANT
EVACUATOR 1/8 PVC DRAIN (DRAIN) ×2 IMPLANT
FACESHIELD LNG OPTICON STERILE (SAFETY) ×10 IMPLANT
GLOVE BIO SURGEON STRL SZ7.5 (GLOVE) ×2 IMPLANT
GLOVE BIO SURGEON STRL SZ8 (GLOVE) ×4 IMPLANT
GLOVE BIOGEL PI IND STRL 8 (GLOVE) ×2 IMPLANT
GLOVE BIOGEL PI INDICATOR 8 (GLOVE) ×2
GLOVE SURG SS PI 6.5 STRL IVOR (GLOVE) IMPLANT
GOWN STRL NON-REIN LRG LVL3 (GOWN DISPOSABLE) ×2 IMPLANT
GOWN STRL REIN XL XLG (GOWN DISPOSABLE) ×6 IMPLANT
HANDPIECE INTERPULSE COAX TIP (DISPOSABLE) ×2
IMMOBILIZER KNEE 20 (SOFTGOODS) ×2
IMMOBILIZER KNEE 20 THIGH 36 (SOFTGOODS) ×1 IMPLANT
KIT BASIN OR (CUSTOM PROCEDURE TRAY) ×2 IMPLANT
MANIFOLD NEPTUNE II (INSTRUMENTS) ×2 IMPLANT
NDL SAFETY ECLIPSE 18X1.5 (NEEDLE) ×2 IMPLANT
NEEDLE HYPO 18GX1.5 SHARP (NEEDLE) ×4
NS IRRIG 1000ML POUR BTL (IV SOLUTION) ×2 IMPLANT
PACK TOTAL JOINT (CUSTOM PROCEDURE TRAY) ×2 IMPLANT
PADDING CAST COTTON 6X4 STRL (CAST SUPPLIES) ×6 IMPLANT
POSITIONER SURGICAL ARM (MISCELLANEOUS) ×2 IMPLANT
SET HNDPC FAN SPRY TIP SCT (DISPOSABLE) ×1 IMPLANT
SPONGE GAUZE 4X4 12PLY (GAUZE/BANDAGES/DRESSINGS) ×2 IMPLANT
STRIP CLOSURE SKIN 1/2X4 (GAUZE/BANDAGES/DRESSINGS) ×4 IMPLANT
SUCTION FRAZIER 12FR DISP (SUCTIONS) ×2 IMPLANT
SUT MNCRL AB 4-0 PS2 18 (SUTURE) ×2 IMPLANT
SUT VIC AB 2-0 CT1 27 (SUTURE) ×6
SUT VIC AB 2-0 CT1 TAPERPNT 27 (SUTURE) ×3 IMPLANT
SUT VLOC 180 0 24IN GS25 (SUTURE) ×2 IMPLANT
SYR 20CC LL (SYRINGE) ×2 IMPLANT
SYR 50ML LL SCALE MARK (SYRINGE) ×2 IMPLANT
TOWEL OR 17X26 10 PK STRL BLUE (TOWEL DISPOSABLE) ×4 IMPLANT
TRAY FOLEY CATH 14FRSI W/METER (CATHETERS) ×2 IMPLANT
WATER STERILE IRR 1500ML POUR (IV SOLUTION) ×2 IMPLANT
WRAP KNEE MAXI GEL POST OP (GAUZE/BANDAGES/DRESSINGS) ×2 IMPLANT

## 2012-07-29 NOTE — Anesthesia Preprocedure Evaluation (Addendum)
Anesthesia Evaluation  Patient identified by MRN, date of birth, ID band Patient awake    Reviewed: Allergy & Precautions, H&P , NPO status , Patient's Chart, lab work & pertinent test results  Airway Mallampati: II TM Distance: >3 FB Neck ROM: Full    Dental no notable dental hx. (+) Teeth Intact   Pulmonary neg pulmonary ROS,  breath sounds clear to auscultation  Pulmonary exam normal       Cardiovascular hypertension, Pt. on medications + dysrhythmias Supra Ventricular Tachycardia Rhythm:Regular Rate:Normal     Neuro/Psych negative neurological ROS  negative psych ROS   GI/Hepatic Neg liver ROS, PUD,   Endo/Other  negative endocrine ROS  Renal/GU negative Renal ROS  negative genitourinary   Musculoskeletal negative musculoskeletal ROS (+)   Abdominal   Peds negative pediatric ROS (+)  Hematology negative hematology ROS (+)   Anesthesia Other Findings   Reproductive/Obstetrics negative OB ROS                          Anesthesia Physical Anesthesia Plan  ASA: II  Anesthesia Plan: Spinal   Post-op Pain Management:    Induction:   Airway Management Planned: Simple Face Mask  Additional Equipment:   Intra-op Plan:   Post-operative Plan:   Informed Consent: I have reviewed the patients History and Physical, chart, labs and discussed the procedure including the risks, benefits and alternatives for the proposed anesthesia with the patient or authorized representative who has indicated his/her understanding and acceptance.   Dental advisory given  Plan Discussed with: CRNA  Anesthesia Plan Comments:         Anesthesia Quick Evaluation

## 2012-07-29 NOTE — Evaluation (Signed)
Physical Therapy Evaluation Patient Details Name: Kaylee Davis MRN: [id number] DOB: Aug 23, 1940 Today's Date: 07/29/2012 Time: 4098-1191 PT Time Calculation (min): 25 min  PT Assessment / Plan / Recommendation Clinical Impression  Pt is a 72 year old female s/p L TKR.  Pt would benefit from acute PT services in order to improve independence with transfers, ambulation, and stairs by increasing L knee ROM and strength to prepare for d/c home with spouse.    PT Assessment  Patient needs continued PT services    Follow Up Recommendations  Home health PT    Does the patient have the potential to tolerate intense rehabilitation      Barriers to Discharge        Equipment Recommendations  Rolling walker with 5" wheels    Recommendations for Other Services     Frequency 7X/week    Precautions / Restrictions Precautions Precautions: Fall;Knee Required Braces or Orthoses: Knee Immobilizer - Left Restrictions Weight Bearing Restrictions: No LLE Weight Bearing: Weight bearing as tolerated   Pertinent Vitals/Pain Premedicated for therapy, repositioned in recliner to comfort Pt educated no pillows under knee      Mobility  Bed Mobility Bed Mobility: Supine to Sit Supine to Sit: 3: Mod assist;HOB elevated Details for Bed Mobility Assistance: verbal cues for technique, assist for trunk and supporting L LE Transfers Transfers: Sit to Stand;Stand to Sit;Stand Pivot Transfers Sit to Stand: 4: Min assist;From bed;From elevated surface;With upper extremity assist Stand to Sit: 4: Min assist;To chair/3-in-1;With upper extremity assist Stand Pivot Transfers: 4: Min assist Details for Transfer Assistance: verbal cues for safe technique, pt reported increased L knee pain during stand pivot while trying to take small step to recliner, verbal cues for steps and using RW to assist with pain control, pt reports dizziness and nausea so only transferred to  recliner Ambulation/Gait Ambulation/Gait Assistance: Not tested (comment)    Exercises     PT Diagnosis: Acute pain;Difficulty walking  PT Problem List: Decreased range of motion;Decreased strength;Decreased activity tolerance;Decreased mobility;Decreased knowledge of precautions;Decreased knowledge of use of DME;Pain PT Treatment Interventions: DME instruction;Gait training;Stair training;Functional mobility training;Therapeutic activities;Therapeutic exercise;Patient/family education   PT Goals Acute Rehab PT Goals PT Goal Formulation: With patient Time For Goal Achievement: 08/05/12 Potential to Achieve Goals: Good Pt will go Supine/Side to Sit: with modified independence PT Goal: Supine/Side to Sit - Progress: Goal set today Pt will go Sit to Supine/Side: with modified independence PT Goal: Sit to Supine/Side - Progress: Goal set today Pt will go Sit to Stand: with modified independence PT Goal: Sit to Stand - Progress: Goal set today Pt will go Stand to Sit: with modified independence PT Goal: Stand to Sit - Progress: Goal set today Pt will Ambulate: 51 - 150 feet;with least restrictive assistive device;with modified independence PT Goal: Ambulate - Progress: Goal set today Pt will Go Up / Down Stairs: 3-5 stairs;with supervision;with least restrictive assistive device;with rail(s) PT Goal: Up/Down Stairs - Progress: Goal set today Pt will Perform Home Exercise Program: with supervision, verbal cues required/provided PT Goal: Perform Home Exercise Program - Progress: Goal set today  Visit Information  Last PT Received On: 07/29/12 Assistance Needed: +2    Subjective Data  Subjective: I'm starting to feel nauseated so I think just to the chair is ok. Patient Stated Goal: home with HHPT   Prior Functioning  Home Living Lives With: Spouse Type of Home: House Home Access: Level entry Home Layout: Two level Alternate Level Stairs-Number of Steps: 4  steps with 2 reachable  rails to get to chair lift Home Adaptive Equipment: Straight cane;Grab bars around toilet Prior Function Level of Independence: Independent with assistive device(s) Comments: pt reports using SPC for balance Communication Communication: No difficulties    Cognition  Cognition Arousal/Alertness: Awake/alert Behavior During Therapy: WFL for tasks assessed/performed Overall Cognitive Status: Within Functional Limits for tasks assessed    Extremity/Trunk Assessment Right Upper Extremity Assessment RUE ROM/Strength/Tone: [facility] for tasks assessed Left Upper Extremity Assessment LUE ROM/Strength/Tone: WFL for tasks assessed Right Lower Extremity Assessment RLE ROM/Strength/Tone: WFL for tasks assessed RLE Sensation: WFL - Light Touch Left Lower Extremity Assessment LLE ROM/Strength/Tone: Deficits LLE ROM/Strength/Tone Deficits: active assist knee ROM -3-50* supine, unable to perform SLR LLE Sensation: WFL - Light Touch   Balance Balance Balance Assessed: No (however pt reports poor balance, no falls)  End of Session PT - End of Session Equipment Utilized During Treatment: Left knee immobilizer Activity Tolerance: Patient limited by pain Patient left: in chair;with call bell/phone within reach CPM Left Knee CPM Left Knee: Off Left Knee Flexion (Degrees): 40 Left Knee Extension (Degrees): 10  GP     [name],KATHrine E 07/29/2012, 3:08 PM Zenovia Jarred, PT, DPT 07/29/2012 Pager: [phone]

## 2012-07-29 NOTE — Progress Notes (Signed)
Utilization review completed.  

## 2012-07-29 NOTE — Interval H&P Note (Signed)
History and Physical Interval Note:  07/29/2012 6:57 AM  Kaylee Davis  has presented today for surgery, with the diagnosis of OSTEO ARTHRITIS LEFT KNEE  The various methods of treatment have been discussed with the patient and family. After consideration of risks, benefits and other options for treatment, the patient has consented to  Procedure(s): LEFT TOTAL KNEE ARTHROPLASTY (Left) as a surgical intervention .  The patient's history has been reviewed, patient examined, no change in status, stable for surgery.  I have reviewed the patient's chart and labs.  Questions were answered to the patient's satisfaction.     Loanne Drilling

## 2012-07-29 NOTE — Anesthesia Procedure Notes (Signed)
Spinal  Patient location during procedure: OR End time: 07/29/2012 8:29 AM Staffing CRNA/Resident: Enriqueta Shutter Performed by: anesthesiologist and resident/CRNA  Preanesthetic Checklist Completed: patient identified, site marked, surgical consent, pre-op evaluation, timeout performed, IV checked, risks and benefits discussed and monitors and equipment checked Spinal Block Patient position: sitting Prep: Betadine Patient monitoring: heart rate, continuous pulse ox and blood pressure Approach: midline Location: L3-4 Injection technique: single-shot Needle Needle type: Pencil-Tip and Sprotte  Needle gauge: 22 G Needle length: 9 cm Assessment Sensory level: T6 Additional Notes Expiration date of kit checked and confirmed. Patient tolerated procedure well, without complications.

## 2012-07-29 NOTE — Op Note (Signed)
Pre-operative diagnosis- Osteoarthritis Left knee(s)  Post-operative diagnosis- Osteoarthritis  Left knee(s)  Procedure-   Left Total Knee Arthroplasty  Surgeon- Gus Rankin. Abdon Petrosky, MD  Assistant- Avel Peace, PA-C   Anesthesia-  Spinal   EBL- * No blood loss amount entered *   Drains Hemovac   Tourniquet time  Total Tourniquet Time Documented: Thigh (Left) - 30 minutes Total: Thigh (Left) - 30 minutes    Complications- None  Condition-PACU - hemodynamically stable.   Brief Clinical Note  Kaylee Davis is a 72 y.o. year old female with end stage OA of her left knee with progressively worsening pain and dysfunction. She has constant pain, with activity and at rest and significant functional deficits with difficulties even with ADLs. She has had extensive non-op management including analgesics, injections of cortisone and viscosupplements, and home exercise program, but remains in significant pain with significant dysfunction. Radiographs show bone on bone arthritis lateral and patellofemoral with significant valgus deformity. She presents now for left Total Knee Arthroplasty.   Procedure in detail---       The patient is brought into the operating room and positioned supine on the operating table. After successful administration of Spinal anesthetic, a tourniquet is placed high on the Left thigh(s) and the lower extremity is prepped and draped in the usual sterile fashion. Time out is performed by the operating team and then the Left  lower extremity is wrapped in Esmarch, knee flexed and the tourniquet inflated to 300 mmHg.       A midline incision is made with a ten blade through the subcutaneous tissue to the level of the extensor mechanism. A fresh blade is used to make a lateral parapatellar arthrotomy due to the patients' valgus deformity. Soft tissue over the proximal lateral tibia is subperiosteally elevated to the joint line with a knife to the posterolateral corner but not  including the structures of the posterolateral corner. Soft tissue over the proximal medial tibia is elevated with attention being paid to avoiding the patellar tendon on the tibial tubercle. The patella is everted medially, knee flexed 90 degrees and the ACL and PCL are removed. Findings are bone on bone lateral and patellofemoral with large lateral and patellar osteophytes .       The drill is used to create a starting hole in the distal femur and the canal is thoroughly irrigated with sterile saline to remove the fatty contents. The 5 degree Left  valgus alignment guide is placed into the femoral canal and the distal femoral cutting block is pinned to remove 10  mm off the distal femur. Resection is made with an oscillating saw.      The tibia is subluxed forward and the menisci are removed. The extramedullary alignment guide is placed referencing proximally at the medial aspect of the tibial tubercle and distally along the second metatarsal axis and tibial crest. The block is pinned to remove 2mm off the more deficient lateral side. Resection is made with an oscillating saw. Size 2.5  is the most appropriate size for the tibia and the proximal tibia is prepared with the modular drill and keel punch for that size.      The femoral sizing guide is placed and size 2.5  is most appropriate. Rotation is marked off the epicondylar axis and confirmed by creating a rectangular flexion gap at 90 degrees. The size 2.5  cutting block is pinned in this rotation and the anterior, posterior and chamfer cuts are made with the  oscillating saw. The intercondylar block is then placed and that cut is made.      Trial size 2.5  tibial component, trial size 2.5  posterior stabilized femur and a 15  mm posterior stabilized rotating platform insert trial is placed. Full extension is achieved with excellent varus/valgus and   anterior/posterior balance throughout full range of motion. The patella is everted and thickness measured  to be 20  mm. Free hand resection is taken to 12 mm, a 35 template is placed, lug holes are drilled, trial patella is placed, and it tracks normally. Osteophytes are removed off the posterior femur with the trial in place. All trials are removed and the cut bone surfaces prepared with pulsatile lavage. Cement is mixed and once ready for implantation, the size 2.5  tibial implant, size 2.5 posterior stabilized femoral component, and the size 35  patella are cemented in place and the patella is held with the clamp. The trial insert is placed and the knee held in full extension.The Exparel (20 ml mixed with 30 ml saline) and then .25% Bupivicaine 20 ml is injected into the extensor mechanism, posterior capsule, medial and lateral gutters and subcutaneous tissues. All extruded cement is removed and once the cement is hard the permanent 15  mm posterior stabilized rotating platform insert is placed into the tibial tray.      The wound is copiously irrigated with saline solution and the tourniquet is released for a total   tourniquet time of 30  minutes. Bleeding is identified and controlled with electrocautery. The extensor mechanism is closed with interrupted #1 PDS leaving open a small area from the superior to inferior pole of the patella to serve as a mini lateral release. Flexion against gravity is 140  degrees and the patella tracks normally. Subcutaneous tissue is closed with 2.0 vicryl and subcuticular with running 4.0 Monocryl.The incision is cleaned and dried and steri-strips and a bulky sterile dressing are applied. The limb is placed into a knee immobilizer and the patient is awakened and transported to recovery in stable condition.      Please note that a surgical assistant was a medical necessity for this procedure in order to perform it in a safe and expeditious manner. Surgical assistant was necessary to retract the ligaments and vital neurovascular structures to prevent injury to them and also  necessary for proper positioning of the limb to allow for anatomic placement of the prosthesis.    Gus Rankin Kaylee Cardozo, MD    07/29/2012, 9:28 AM

## 2012-07-29 NOTE — H&P (View-Only) (Signed)
Kaylee Davis  DOB: 03/31/1940 Married / Language: English / Race: White Female  Date of Admission:  07/29/2012  Chief Complaint:  Left Knee Pain  History of Present Illness The patient is a 72 year old female who comes in for a preoperative History and Physical. The patient is scheduled for a left total knee arthroplasty to be performed by Dr. Frank V. Aluisio, MD at [facility] on 08/19/2012. The patient is a 71 year old female who presents for follow up of their knee. The patient is being followed for their bilateral knee pain and osteoarthritis. They are now month(s) out from cortisone injection (left knee only). Symptoms reported today include: pain, stiffness and locking. The patient feels that they are doing well and report their pain level to be moderate. Current treatment includes: home exercise program and Celebrex. The following medication has been used for pain control: none. The patient has reported improvement of their symptoms with: Cortisone injections. The patient indicates that they have questions or concerns today regarding pain, their progress at this point and is it time for surgery.] Kaylee Davis did get some releif with her cortisone shot but is has finally worn off. Her knee has continued to get worse over time. It has started to affect her quality of life. It has become more difficult to get around. She feels a grabbing, locking sensation with the knee. It has become more difficult to get up and down from a seated position and to bend over to pick something up. She has to use a cane when she is out and about. Uneven surfaces have become more difficult also. She feels that she has lost a lot of her freedom to do things. She has become more afraid to go out on her own now. It is more difficult to go up and down stairs. She denies swelling with the knee. The patient feels like the knee has essentially taken over her whole life at this point. She  is unable to do things that she desires. She really has to rely on her husband to be there and to hold onto him when she is walking because she is concerned that the knee is giving out. She is ready to proceed with surgical intervention on the left knee. They have been treated conservatively in the past for the above stated problem and despite conservative measures, they continue to have progressive pain and severe functional limitations and dysfunction. They have failed non-operative management including home exercise, medications, and injections. It is felt that they would benefit from undergoing total joint replacement. Risks and benefits of the procedure have been discussed with the patient and they elect to proceed with surgery. There are no active contraindications to surgery such as ongoing infection or rapidly progressive neurological disease.   Problem List Primary osteoarthritis of both knees     Allergies Sulfanilamide *CHEMICALS* Penicillin VK *PENICILLINS*. with red dye   Family History Family history unknown - Adopted   Social History Tobacco use. never smoker Marital status. married Pain Contract. no Tobacco / smoke exposure. no Alcohol use. current drinker; drinks wine; only occasionally per week Children. 2 Exercise. does running / walking and gym / weights Illicit drug use. no Living situation. live with spouse Current work status. retired Drug/Alcohol Rehab (Currently). no Drug/Alcohol Rehab (Previously). no Post-Surgical Plans. HOME   Medication History AmLODIPine Besylate (10MG Tablet, Oral) Active. CeleBREX (100MG Capsule, Oral) Active. Furosemide (40MG Tablet, Oral) Active. NexIUM (40MG Capsule DR, Oral) Active. Premarin (  0.625MG Tablet, Oral) Active.   Past Surgical History Appendectomy Hemorrhoidectomy Hysterectomy. complete (non-cancerous) Tonsillectomy   Medical History High blood pressure Gastroesophageal Reflux  Disease Migraine Headache Impaired Vision Measles Mumps Menopause PVC (premature ventricular contraction) (427.69) Duodenal ulcer (532.90) Acute duodenal ulcer with bleeding (532.00)  General: No chills, fevers, night sweats, fatigue. Neuro:  No seizures, syncope, paralysis, visual problems. Respiratory:  No shortness of breath, productive cough, hemoptysis. Cardiovascular: No chest pain, angina, palpitations, orthopnea. GI: No nausea, vomiting, diarrhea, constipation. GU:  No dysuria, hematuria, discharge. Musculoskeletal:  Joint pain   Vitals Weight: 167 lb Height: 66 in Height was reported by patient. Body Surface Area: 1.88 m Body Mass Index: 26.95 kg/m Pulse: 76 (Regular) Resp.: 14 (Unlabored) BP: 148/78 (Sitting, Right Arm, Standard)    Physical Exam The physical exam findings are as follows:  Note: Patient is a 71 year old female with continued knee pain. Patient is accompanied today by her husband.   General Mental Status - Alert, cooperative and good historian. General Appearance- pleasant. Not in acute distress. Orientation- Oriented X3. Build & Nutrition- Well nourished and Well developed.   Head and Neck Head- normocephalic, atraumatic . Neck Global Assessment- supple. no bruit auscultated on the right and no bruit auscultated on the left.   Eye Vision- Wears corrective lenses. Pupil- Bilateral- Regular and Round. Motion- Bilateral- EOMI.   Chest and Lung Exam Auscultation: Breath sounds:- clear at anterior chest wall and - clear at posterior chest wall. Adventitious sounds:- No Adventitious sounds.   Cardiovascular Auscultation:Rhythm- Regular rate and rhythm. Heart Sounds- S1 WNL and S2 WNL. Murmurs & Other Heart Sounds:Auscultation of the heart reveals - No Murmurs.   Abdomen Palpation/Percussion:Tenderness- Abdomen is non-tender to palpation. Rigidity (guarding)- Abdomen is  soft. Auscultation:Auscultation of the abdomen reveals - Bowel sounds normal.   Female Genitourinary  Not done, not pertinent to present illness  Musculoskeletal Both knees are examined. Right knee: She's got about an 8 degree valgus mal-alignment deformity although her motion is excellent with 0 degrees of extension, flexion back to 130 actively and 135 passively. No effusion noted. She's got moderate crepitus noted on the PROM. Left knee is a little bit worse. She's got about a 13 degree valgus mal-alignment deformity, ROM very good of 0-125 active, 130 passive. She is tender to palpation a little bit over the lateral vs. the medial joint line. Marked crepitus noted on PROM.  RADIOGRAPHS: AP standing view of both knees and lateral views. The right knee shows significant joint loss with near bone on bone in the lateral compartment with femoral and tibial spurring and, on the lateral view, it shows where she is near bone on bone. AP view of the left knee shows complete loss of the joint space, lateral compartment, with sclerotic changes. On the lateral view of the left knee, it again confirms the bone on bone.  Assessment & Plan Primary osteoarthritis of both knees (715.16) Impression: Left Knee  Note: Plan is for a Left Total Knee Replacement by Dr. Aluisio.  Plan is to go home.  PCP - Dr. Norins - Patient has been seen preoperatively and felt to be stable for surgery.  Signed electronically by DREW L Ryu Cerreta, PA-C  

## 2012-07-29 NOTE — Anesthesia Postprocedure Evaluation (Signed)
  Anesthesia Post-op Note  Patient: Kaylee Davis  Procedure(s) Performed: Procedure(s) (LRB): LEFT TOTAL KNEE ARTHROPLASTY (Left)  Patient Location: PACU  Anesthesia Type: Spinal  Level of Consciousness: awake and alert   Airway and Oxygen Therapy: Patient Spontanous Breathing  Post-op Pain: mild  Post-op Assessment: Post-op Vital signs reviewed, Patient's Cardiovascular Status Stable, Respiratory Function Stable, Patent Airway and No signs of Nausea or vomiting  Last Vitals:  Filed Vitals:   07/29/12 1151  BP: 137/90  Pulse: 59  Temp: 36.4 C  Resp: 14    Post-op Vital Signs: stable   Complications: No apparent anesthesia complications

## 2012-07-29 NOTE — Transfer of Care (Signed)
Immediate Anesthesia Transfer of Care Note  Patient: Kaylee Davis  Procedure(s) Performed: Procedure(s): LEFT TOTAL KNEE ARTHROPLASTY (Left)  Patient Location: PACU  Anesthesia Type:Regional  Level of Consciousness: awake, alert  and oriented  Airway & Oxygen Therapy: Patient Spontanous Breathing and Patient connected to face mask oxygen  Post-op Assessment: Report given to PACU RN and Post -op Vital signs reviewed and stable  Post vital signs: Reviewed and stable  Complications: No apparent anesthesia complications

## 2012-07-30 ENCOUNTER — Encounter (HOSPITAL_COMMUNITY): Payer: Self-pay | Admitting: Orthopedic Surgery

## 2012-07-30 DIAGNOSIS — E876 Hypokalemia: Secondary | ICD-10-CM

## 2012-07-30 DIAGNOSIS — D62 Acute posthemorrhagic anemia: Secondary | ICD-10-CM

## 2012-07-30 LAB — CBC
HCT: 29.3 % — ABNORMAL LOW (ref 36.0–46.0)
Hemoglobin: 9.9 g/dL — ABNORMAL LOW (ref 12.0–15.0)
MCH: 30.5 pg (ref 26.0–34.0)
MCV: 90.2 fL (ref 78.0–100.0)
RBC: 3.25 MIL/uL — ABNORMAL LOW (ref 3.87–5.11)

## 2012-07-30 LAB — BASIC METABOLIC PANEL
Chloride: 100 mEq/L (ref 96–112)
GFR calc Af Amer: >90 mL/min (ref >90)
GFR calc non Af Amer: >90 mL/min (ref >90)

## 2012-07-30 MED ORDER — NON FORMULARY: 40.0000 mg | Freq: Every day | Status: DC

## 2012-07-30 MED ORDER — ESOMEPRAZOLE MAGNESIUM 40 MG PO CPDR
40.0000 mg | DELAYED_RELEASE_CAPSULE | Freq: Every day | ORAL | Status: DC
  Administered 2012-07-31: 40 mg via ORAL
  Filled 2012-07-30 (×3): qty 1

## 2012-07-30 MED ORDER — POTASSIUM CHLORIDE CRYS ER 20 MEQ PO TBCR
40.0000 meq | EXTENDED_RELEASE_TABLET | Freq: Two times a day (BID) | ORAL | Status: AC
  Administered 2012-07-30 (×2): 40 meq via ORAL
  Filled 2012-07-30 (×3): qty 2

## 2012-07-30 NOTE — Progress Notes (Signed)
   Subjective: 1 Day Post-Op Procedure(s) (LRB): LEFT TOTAL KNEE ARTHROPLASTY (Left) Patient reports pain as mild and moderate.   Patient seen in rounds with Dr. Lequita Halt. Patient is well, but has had some minor complaints of pain in the knee and back, requiring pain medications We will start therapy today.  Plan is to go Home after hospital stay.  Objective: Vital signs in last 24 hours: Temp:  [97.5 F (36.4 C)-99 F (37.2 C)] 98.5 F (36.9 C) (05/13 0635) Pulse Rate:  [55-70] 65 (05/13 0635) Resp:  [12-21] 16 (05/13 0635) BP: (110-137)/(57-90) 115/70 mmHg (05/13 0635) SpO2:  [99 %-100 %] 100 % (05/13 0635) Weight:  [75.297 kg (166 lb)] 75.297 kg (166 lb) (05/12 1231)  Intake/Output from previous day:  Intake/Output Summary (Last 24 hours) at 07/30/12 0754 Last data filed at 07/30/12 0635  Gross per 24 hour  Intake   3625 ml  Output   4215 ml  Net   -590 ml    Intake/Output this shift:    Labs:  Recent Labs  07/30/12 0415  HGB 9.9*    Recent Labs  07/30/12 0415  WBC 9.6  RBC 3.25*  HCT 29.3*  PLT 225    Recent Labs  07/30/12 0415  NA 136  K 3.2*  CL 100  CO2 30  BUN 6  CREATININE 0.50  GLUCOSE 101*  CALCIUM 8.0*   No results found for this basename: LABPT, INR,  in the last 72 hours  EXAM General - Patient is Alert, Appropriate and Oriented Extremity - Neurovascular intact Sensation intact distally Dorsiflexion/Plantar flexion intact Dressing - dressing C/D/I Motor Function - intact, moving foot and toes well on exam.  Hemovac pulled without difficulty.  Past Medical History  Diagnosis Date  . Hypertension   . TMJ (temporomandibular joint disorder)     PAST HX GRINDING TEETH --BUT NO LONGER A PROBLEM  . Dyspepsia   . Migraine headache     much reduced frequency  . History of PSVT (paroxysmal supraventricular tachycardia)     can control by doing a valsalva. Triggered by elevated heart rate  . GI bleed     GI BLEED FROM DUODENAL  ULCER--ABOUT 17 YRS AGO - GIVEN TRANSFUSIONS  . GERD (gastroesophageal reflux disease)   . Arthritis     OA BOTH KNEES; OCCAS PAIN LEFT SHOULDER - HX OF ROTATOR CUFF PROBLEM    Assessment/Plan: 1 Day Post-Op Procedure(s) (LRB): LEFT TOTAL KNEE ARTHROPLASTY (Left) Principal Problem:   OA (osteoarthritis) of knee Active Problems:   Postoperative anemia due to acute blood loss   Hypokalemia  Estimated body mass index is 26.81 kg/(m^2) as calculated from the following:   Height as of this encounter: 5\' 6"  (1.676 m).   Weight as of this encounter: 75.297 kg (166 lb). Up with therapy Plan for discharge tomorrow Discharge home with home health  DVT Prophylaxis - Xarelto Weight-Bearing as tolerated to left leg No vaccines. D/C O2 and Pulse OX and try on Room [address]  Patrica Duel 07/30/2012, 7:54 AM

## 2012-07-30 NOTE — Progress Notes (Signed)
Received orders for rw. Will be delivered to hospital room prior to d/c. °

## 2012-07-30 NOTE — Evaluation (Signed)
Occupational Therapy Evaluation Patient Details Name: Kaylee Davis MRN: [id number] DOB: 1940/06/27 Today's Date: 07/30/2012 Time: 1025-1050 OT Time Calculation (min): 25 min  OT Assessment / Plan / Recommendation Clinical Impression  Pt is s/p L TKA and displays decreased strength and independence with ADL. Will benefit from skilled OT services to increase ADL independence.     OT Assessment  Patient needs continued OT Services    Follow Up Recommendations  No OT follow up;Supervision/Assistance - 24 hour    Barriers to Discharge      Equipment Recommendations  3 in 1 bedside comode    Recommendations for Other Services    Frequency  Min 2X/week    Precautions / Restrictions Precautions Precautions: Fall;Knee Required Braces or Orthoses: Knee Immobilizer - Left Restrictions LLE Weight Bearing: Weight bearing as tolerated        ADL  Eating/Feeding: Independent Where Assessed - Eating/Feeding: Chair Grooming: Wash/dry hands;Set up Where Assessed - Grooming: Supported sitting Upper Body Bathing: Chest;Right arm;Left arm;Abdomen;Set up Where Assessed - Upper Body Bathing: Supported sitting Lower Body Bathing: Minimal assistance Where Assessed - Lower Body Bathing: Supported sit to stand Upper Body Dressing: Set up Where Assessed - Upper Body Dressing: Unsupported sitting Lower Body Dressing: Moderate assistance Where Assessed - Lower Body Dressing: Supported sit to Pharmacist, hospital: Minimal assistance Statistician Method: Surveyor, minerals: Materials engineer and Hygiene: Minimal assistance Where Assessed - Engineer, mining and Hygiene: Sit to stand from 3-in-1 or toilet Equipment Used: Rolling walker ADL Comments: Pt feeling nauseous but moving pretty well to transfer on and off BSC. Educated pt and spouse on how to [employer], when to wear, etc. Discussed LB dressing and husband plans to  assist.     OT Diagnosis: Generalized weakness  OT Problem List: Decreased strength OT Treatment Interventions: Self-care/ADL training;DME and/or AE instruction;Patient/family education;Therapeutic activities   OT Goals Acute Rehab OT Goals OT Goal Formulation: With patient/family Time For Goal Achievement: 08/06/12 Potential to Achieve Goals: Good ADL Goals Pt Will Perform Grooming: with supervision;Standing at sink ADL Goal: Grooming - Progress: Goal set today Pt Will Transfer to Toilet: with supervision;Ambulation;3-in-1 ADL Goal: Toilet Transfer - Progress: Goal set today Pt Will Perform Toileting - Clothing Manipulation: with supervision;Standing ADL Goal: Toileting - Clothing Manipulation - Progress: Goal set today Pt Will Perform Tub/Shower Transfer: Shower transfer;with min assist ADL Goal: Tub/Shower Transfer - Progress: Goal set today  Visit Information  Last OT Received On: 07/30/12 Assistance Needed: +1    Subjective Data  Subjective: I dont know why I feel nauseous today Patient Stated Goal: to feel better   Prior Functioning     Home Living Lives With: Spouse Type of Home: House Home Access: Level entry Home Layout: Two level Alternate Level Stairs-Number of Steps: 4 steps with 2 reachable rails to get to chair lift Bathroom Shower/Tub: Health visitor: Standard Home Adaptive Equipment: Straight cane;Grab bars around toilet Prior Function Level of Independence: Independent with assistive device(s) Communication Communication: No difficulties         Vision/Perception     Cognition  Cognition Arousal/Alertness: Awake/alert Behavior During Therapy: WFL for tasks assessed/performed Overall Cognitive Status: Within Functional Limits for tasks assessed    Extremity/Trunk Assessment Right Upper Extremity Assessment RUE ROM/Strength/Tone: [facility] for tasks assessed Left Upper Extremity Assessment LUE ROM/Strength/Tone: WFL for tasks  assessed     Mobility Bed Mobility Bed Mobility: Supine to Sit Supine to Sit: 4:  Min assist Details for Bed Mobility Assistance: verbal cues for technique, assist for L LE Transfers Transfers: Sit to Stand;Stand to Sit Sit to Stand: 4: Min assist;With upper extremity assist;From chair/3-in-1 Stand to Sit: 4: Min assist;With upper extremity assist;To chair/3-in-1 Details for Transfer Assistance: verbal cues for hand placement and L LE positioning.         Balance Balance Balance Assessed: Yes Dynamic Standing Balance Dynamic Standing - Level of Assistance: 4: Min assist   End of Session OT - End of Session Activity Tolerance: Other (comment) (has nausea) Patient left: in chair;with call bell/phone within reach;with family/visitor present CPM Left Knee CPM Left Knee: Off  GO     Lennox Laity [phone] 07/30/2012, 10:59 AM

## 2012-07-30 NOTE — Progress Notes (Signed)
Physical Therapy Treatment Patient Details Name: Kaylee Davis MRN: [id number] DOB: [birth year] Today's Date: 07/30/2012 Time: 4098-1191 PT Time Calculation (min): 24 min  PT Assessment / Plan / Recommendation Comments on Treatment Session  Pt began ambulation today and performed a couple exercises in chair.    Follow Up Recommendations  Home health PT     Does the patient have the potential to tolerate intense rehabilitation     Barriers to Discharge        Equipment Recommendations  Rolling walker with 5" wheels    Recommendations for Other Services    Frequency     Plan Discharge plan remains appropriate;Frequency remains appropriate    Precautions / Restrictions Precautions Precautions: Fall;Knee Required Braces or Orthoses: Knee Immobilizer - Left Restrictions LLE Weight Bearing: Weight bearing as tolerated   Pertinent Vitals/Pain Premedicated, ice applied    Mobility  Bed Mobility Bed Mobility: Supine to Sit Supine to Sit: 4: Min assist Details for Bed Mobility Assistance: verbal cues for technique, assist for L LE Transfers Transfers: Sit to Stand;Stand to Sit Sit to Stand: 4: Min assist;From bed;From elevated surface;With upper extremity assist Stand to Sit: 4: Min assist;To chair/3-in-1;With upper extremity assist Details for Transfer Assistance: verbal cues for safe technique, assist for rise and controlling descent Ambulation/Gait Ambulation/Gait Assistance: 4: Min guard Ambulation Distance (Feet): 40 Feet Assistive device: Rolling walker Ambulation/Gait Assistance Details: verbal cues for technique, sequence, step length, heel strike, distance from RW Gait Pattern: Step-to pattern;Antalgic Gait velocity: decreased    Exercises Total Joint Exercises Ankle Circles/Pumps: AROM;Both;20 reps Quad Sets: AROM;10 reps Heel Slides: AAROM;10 reps Goniometric ROM: -3-30* limited by pain   PT Diagnosis:    PT Problem List:   PT Treatment Interventions:      PT Goals Acute Rehab PT Goals PT Goal: Supine/Side to Sit - Progress: Progressing toward goal PT Goal: Sit to Stand - Progress: Progressing toward goal PT Goal: Stand to Sit - Progress: Progressing toward goal PT Goal: Ambulate - Progress: Progressing toward goal PT Goal: Perform Home Exercise Program - Progress: Progressing toward goal  Visit Information  Last PT Received On: 07/30/12 Assistance Needed: +1    Subjective Data  Subjective: My husband brought me coffee but I think I should stick with ginger ale right now. (after ambulation)   Cognition  Cognition Arousal/Alertness: Awake/alert Behavior During Therapy: WFL for tasks assessed/performed Overall Cognitive Status: Within Functional Limits for tasks assessed    Balance     End of Session PT - End of Session Equipment Utilized During Treatment: Left knee immobilizer Activity Tolerance: Patient limited by fatigue;Patient limited by pain Patient left: in chair;with call bell/phone within reach;with family/visitor present CPM Left Knee CPM Left Knee: Off   GP     Shonette Rhames,KATHrine E 07/30/2012, 10:25 AM Zenovia Jarred, PT, DPT 07/30/2012 Pager: [phone]

## 2012-07-30 NOTE — Care Management Note (Addendum)
    Page 1 of 2   07/31/2012     3:09:26 PM   CARE MANAGEMENT NOTE 07/31/2012  Patient:  Kaylee Davis, Kaylee Davis   Account Number:  [id number]  Date Initiated:  07/30/2012  Documentation initiated by:  Colleen Can  Subjective/Objective Assessment:   DX OSTEOARTHRITIS RIGHT KNEE: TOTAL KNEE REPLACEMNT    Pre-arranged with Genevieve Norlander for [facility] services upon discharge.     Action/Plan:   CM spoke with patient and spouse. Plans are for patient to return to her home in [place] where spouse will be caregiver. Pt will need RW and 3n1. They plans to use [facility] for [facility] services.   Anticipated DC Date:  07/31/2012   Anticipated DC Plan:  HOME W HOME HEALTH SERVICES      DC Planning Services  CM consult      PAC Choice  DURABLE MEDICAL EQUIPMENT  HOME HEALTH   Choice offered to / List presented to:  C-1 Patient   DME arranged  3-N-1  Levan Hurst      DME agency  [facility]     HH arranged  HH-2 PT      [facility] agency  [facility]   Status of service:  Completed, signed off Medicare Important Message given?  NA - LOS <3 / Initial given by admissions (If response is "NO", the following Medicare IM given date fields will be blank) Date Medicare IM given:   Date Additional Medicare IM given:    Discharge Disposition:  HOME W HOME HEALTH SERVICES  Per UR Regulation:    If discussed at Long Length of Stay Meetings, dates discussed:    Comments:  07/31/2012 Colleen Can BSN RN CCM [phone] Genevieve Norlander will provide HHpt services day after pt is discharged. Anticipate d/c today.

## 2012-07-30 NOTE — Progress Notes (Signed)
Physical Therapy Treatment Note   07/30/12 1500  PT Visit Information  Last PT Received On 07/30/12  Assistance Needed +1  PT Time Calculation  PT Start Time 1313  PT Stop Time 1340  PT Time Calculation (min) 27 min  Subjective Data  Subjective I'm so tired  Precautions  Precautions Fall;Knee  Required Braces or Orthoses Knee Immobilizer - Left  Restrictions  LLE Weight Bearing WBAT  Cognition  Arousal/Alertness Awake/alert  Behavior During Therapy WFL for tasks assessed/performed  Overall Cognitive Status Within Functional Limits for tasks assessed  Bed Mobility  Bed Mobility Supine to Sit;Sit to Supine  Supine to Sit 4: Min assist  Sit to Supine 4: Min assist  Details for Bed Mobility Assistance verbal cues for technique, assist for L LE  Transfers  Transfers Sit to Stand;Stand to Sit  Sit to Stand 4: Min assist;With upper extremity assist;From bed  Stand to Sit 4: Min assist;With upper extremity assist;To bed  Details for Transfer Assistance verbal cues for safe technqiue  Ambulation/Gait  Ambulation/Gait Assistance 4: Min guard  Ambulation Distance (Feet) 35 Feet  Assistive device Rolling walker  Ambulation/Gait Assistance Details pt fatigued so only short distance, verbal cues for sequence and RW distance  Gait Pattern Step-to pattern;Antalgic  Gait velocity decreased  Total Joint Exercises  Ankle Circles/Pumps AROM;Both;20 reps  [employer] AROM;10 reps;Both  Heel Slides AAROM;10 reps;Supine;Left  Gluteal Sets AROM;10 reps;Both  Short Arc [employer];Left;10 reps  Hip ABduction/ADduction AAROM;Left;10 reps  Straight Leg Raises AAROM;Left;10 reps  PT - End of Session  Equipment Utilized During Treatment Left knee immobilizer  Activity Tolerance Patient limited by fatigue;Patient limited by pain  Patient left in bed;with call bell/phone within reach  Nurse Communication Patient requests pain meds  PT - Assessment/Plan  Comments on Treatment Session Pt reports  increased fatigue and pain this afternoon however able to tolerate short distance ambulation and bed exercises.  PT Plan Discharge plan remains appropriate;Frequency remains appropriate  Follow Up Recommendations Home health PT  PT equipment Rolling walker with 5" wheels  Acute Rehab PT Goals  PT Goal: Supine/Side to Sit - Progress Progressing toward goal  PT Goal: Sit to Supine/Side - Progress Progressing toward goal  PT Goal: Sit to Stand - Progress Progressing toward goal  PT Goal: Stand to Sit - Progress Progressing toward goal  PT Goal: Ambulate - Progress Progressing toward goal  PT Goal: Perform Home Exercise Program - Progress Progressing toward goal  PT General Charges  $$ ACUTE PT VISIT 1 Procedure  PT Treatments  $Gait Training 8-22 mins  $Therapeutic Exercise 8-22 mins   Pt premedicated prior to therapy however RN notified of pain and ice packs to L knee.  Zenovia Jarred, PT, DPT 07/30/2012 Pager: [phone]

## 2012-07-31 LAB — CBC
HCT: 30.5 % — ABNORMAL LOW (ref 36.0–46.0)
MCH: 29.9 pg (ref 26.0–34.0)
MCV: 89.4 fL (ref 78.0–100.0)
Platelets: 250 10*3/uL (ref 150–400)
RDW: 12.3 % (ref 11.5–15.5)
WBC: 12.7 10*3/uL — ABNORMAL HIGH (ref 4.0–10.5)

## 2012-07-31 LAB — BASIC METABOLIC PANEL
BUN: 8 mg/dL (ref 6–23)
Calcium: 8.8 mg/dL (ref 8.4–10.5)
Chloride: 102 mEq/L (ref 96–112)
Creatinine, Ser: 0.43 mg/dL — ABNORMAL LOW (ref 0.50–1.10)
GFR calc Af Amer: >90 mL/min (ref >90)

## 2012-07-31 MED ORDER — METHOCARBAMOL 500 MG PO TABS: 500.0000 mg | ORAL_TABLET | Freq: Four times a day (QID) | ORAL | Status: DC | PRN

## 2012-07-31 MED ORDER — RIVAROXABAN 10 MG PO TABS: 10.0000 mg | ORAL_TABLET | Freq: Every day | ORAL | Status: DC

## 2012-07-31 MED ORDER — POTASSIUM CHLORIDE CRYS ER 20 MEQ PO TBCR
40.0000 meq | EXTENDED_RELEASE_TABLET | Freq: Once | ORAL | Status: AC
  Administered 2012-07-31: 40 meq via ORAL
  Filled 2012-07-31: qty 2

## 2012-07-31 MED ORDER — HYDROCODONE-ACETAMINOPHEN 5-325 MG PO TABS
1.0000 | ORAL_TABLET | ORAL | Status: DC | PRN
  Administered 2012-07-31: 2 via ORAL
  Filled 2012-07-31: qty 2

## 2012-07-31 MED ORDER — TRAMADOL HCL 50 MG PO TABS: 50.0000 mg | ORAL_TABLET | Freq: Four times a day (QID) | ORAL | Status: DC | PRN

## 2012-07-31 MED ORDER — HYDROCODONE-ACETAMINOPHEN 5-325 MG PO TABS: 1.0000 | ORAL_TABLET | ORAL | Status: DC | PRN

## 2012-07-31 NOTE — Progress Notes (Signed)
Physical Therapy Treatment Note   07/31/12 1500  PT Visit Information  Last PT Received On 07/31/12  Assistance Needed +1  PT Time Calculation  PT Start Time 1415  PT Stop Time 1431  PT Time Calculation (min) 16 min  Subjective Data  Subjective Can we go to the bathroom first?  Precautions  Precautions Fall;Knee  Required Braces or Orthoses Knee Immobilizer - Left  Restrictions  LLE Weight Bearing WBAT  Cognition  Arousal/Alertness Awake/alert  Behavior During Therapy WFL for tasks assessed/performed  Overall Cognitive Status Within Functional Limits for tasks assessed  Bed Mobility  Bed Mobility Supine to Sit  Supine to Sit 5: Supervision  Details for Bed Mobility Assistance UEs assisted L LE to EOB  Transfers  Transfers Sit to Stand;Stand to Sit  Sit to Stand 5: Supervision;With upper extremity assist;From chair/3-in-1;From bed  Stand to Sit With upper extremity assist;To chair/3-in-1;5: Supervision  Details for Transfer Assistance verbal cues for safe technique  Ambulation/Gait  Ambulation/Gait Assistance 5: Supervision  Ambulation Distance (Feet) 100 Feet  Assistive device Rolling walker  Ambulation/Gait Assistance Details verbal cues for step length and RW distance  Gait Pattern Step-to pattern;Antalgic;Trunk flexed  Gait velocity decreased  Stairs Yes  Stairs Assistance 4: Min guard  Stairs Assistance Details (indicate cue type and reason) verbal cues for technique, sequence, pt less fearful this session about R knee buckling  Stair Management Technique Two rails;Step to pattern;Forwards  Number of Stairs 2  PT - End of Session  Equipment Utilized During Treatment Left knee immobilizer  Activity Tolerance Patient tolerated treatment well  Patient left in chair;with call bell/phone within reach;with family/visitor present  PT - Assessment/Plan  Comments on Treatment Session Pt ambulated again in hallway and practiced stairs.  Pt feels more comfortable with stairs  this session and feels ready for d/c home with spouse today.  PT Plan Discharge plan remains appropriate;Frequency remains appropriate  Follow Up Recommendations Home health PT  PT equipment Rolling walker with 5" wheels  Acute Rehab PT Goals  PT Goal: Supine/Side to Sit - Progress Progressing toward goal  PT Goal: Sit to Stand - Progress Progressing toward goal  PT Goal: Stand to Sit - Progress Progressing toward goal  PT Goal: Ambulate - Progress Progressing toward goal  PT Goal: Up/Down Stairs - Progress Progressing toward goal  PT General Charges  $$ ACUTE PT VISIT 1 Procedure  PT Treatments  $Gait Training 8-22 mins   Zenovia Jarred, PT, DPT 07/31/2012 Pager: [phone]

## 2012-07-31 NOTE — Progress Notes (Signed)
Occupational Therapy Treatment Patient Details Name: Kaylee Davis MRN: [id number] DOB: 06/25/1940 Today's Date: 07/31/2012 Time: 4098-1191 OT Time Calculation (min): 13 min  OT Assessment / Plan / Recommendation Comments on Treatment Session Pt doing well. Practiced shower transfer. Educateed on how to adjust 3in1 to appropriate height, Ki wear, and functional transfers.     Follow Up Recommendations  No OT follow up;Supervision/Assistance - 24 hour    Barriers to Discharge       Equipment Recommendations  3 in 1 bedside comode    Recommendations for Other Services    Frequency Min 2X/week   Plan Discharge plan remains appropriate    Precautions / Restrictions Precautions Precautions: Fall;Knee Required Braces or Orthoses: Knee Immobilizer - Left Restrictions LLE Weight Bearing: Weight bearing as tolerated        ADL  Toilet Transfer: Minimal assistance Toilet Transfer Method: Other (comment) (with walker taking steps) Tub/Shower Transfer: Min guard Tub/Shower Transfer Method:  (step back over shower ledge and forward coming out with RW) Equipment Used: Rolling walker ADL Comments: Pt states she was up earlier without the KI to the bathroom. Emphasized need for KI until able to SLR. Pt and spouse verbalize understanding. Spouse present and assisted with shower transfer by steadying RW. Both verbalized understanding of technique. Pt doing well and d/c planned for today.     OT Diagnosis:    OT Problem List:   OT Treatment Interventions:     OT Goals ADL Goals ADL Goal: Toilet Transfer - Progress: Progressing toward goals ADL Goal: Tub/Shower Transfer - Progress: Met  Visit Information  Last OT Received On: 07/31/12 Assistance Needed: +1    Subjective Data  Subjective: I did well with the potty this morning Patient Stated Goal: home   Prior Functioning       Cognition  Cognition Arousal/Alertness: Awake/alert Behavior During Therapy: WFL for tasks  assessed/performed Overall Cognitive Status: Within Functional Limits for tasks assessed    Mobility  Transfers Transfers: Sit to Stand;Stand to Sit Sit to Stand: 4: Min guard;With upper extremity assist;From chair/3-in-1 Stand to Sit: 4: Min assist;With upper extremity assist Details for Transfer Assistance: min guidance to sit down to discomfort in L knee. min verbal cues hand placement    Exercises      Balance     End of Session OT - End of Session Activity Tolerance: Patient tolerated treatment well Patient left: in chair;with call bell/phone within reach;with family/visitor present  GO     Lennox Laity [phone] 07/31/2012, 9:48 AM

## 2012-07-31 NOTE — Progress Notes (Signed)
Physical Therapy Treatment Patient Details Name: Kaylee Davis MRN: [id number] DOB: [birth year] Today's Date: 07/31/2012 Time: 0865-7846 PT Time Calculation (min): 29 min  PT Assessment / Plan / Recommendation Comments on Treatment Session  Pt ambulated in hallway and performed steps.  Pt with fear of R knee buckling during descent so practiced twice and pt agreeable to perform again this afternoon to increase confidence.  Pt also performed a couple exercises and given/reviewed handout on exercises.    Follow Up Recommendations  Home health PT     Does the patient have the potential to tolerate intense rehabilitation     Barriers to Discharge        Equipment Recommendations  Rolling walker with 5" wheels    Recommendations for Other Services    Frequency     Plan Discharge plan remains appropriate;Frequency remains appropriate    Precautions / Restrictions Precautions Precautions: Fall;Knee Required Braces or Orthoses: Knee Immobilizer - Left Restrictions LLE Weight Bearing: Weight bearing as tolerated   Pertinent Vitals/Pain Premedicated, ice packs applied    Mobility  Bed Mobility Bed Mobility: Not assessed Transfers Transfers: Sit to Stand;Stand to Sit Sit to Stand: With upper extremity assist;4: Min guard;From chair/3-in-1 Stand to Sit: 4: Min assist;With upper extremity assist;To chair/3-in-1 Details for Transfer Assistance: verbal cues for safe technqiue including L LE forward, assist to control descent due to L knee pain Ambulation/Gait Ambulation/Gait Assistance: 4: Min guard Ambulation Distance (Feet): 80 Feet Assistive device: Rolling walker Ambulation/Gait Assistance Details: verbal cues for RW distance and posture Gait Pattern: Step-to pattern;Antalgic;Trunk flexed Stairs: Yes Stairs Assistance: 4: Min IT consultant Details (indicate cue type and reason): performed twice, pt reports fear with descent due to fear of R knee buckling, states  she has had this fear for awhile so performed 2nd time with encouragement to let R knee bend and use rails to assist with descent Stair Management Technique: Two rails;Step to pattern;Forwards Number of Stairs: 2    Exercises Total Joint Exercises Ankle Circles/Pumps: AROM;Both;20 reps Quad Sets: AROM;Left;20 reps Short Arc [employer];Left;10 reps Heel Slides: AAROM;Left;15 reps;Seated   PT Diagnosis:    PT Problem List:   PT Treatment Interventions:     PT Goals Acute Rehab PT Goals PT Goal: Sit to Stand - Progress: Progressing toward goal PT Goal: Stand to Sit - Progress: Progressing toward goal PT Goal: Ambulate - Progress: Progressing toward goal PT Goal: Up/Down Stairs - Progress: Progressing toward goal PT Goal: Perform Home Exercise Program - Progress: Progressing toward goal  Visit Information  Last PT Received On: 07/31/12 Assistance Needed: +1    Subjective Data  Subjective: I've been up around the room with my husband.   Cognition  Cognition Arousal/Alertness: Awake/alert Behavior During Therapy: WFL for tasks assessed/performed Overall Cognitive Status: Within Functional Limits for tasks assessed    Balance     End of Session PT - End of Session Equipment Utilized During Treatment: Left knee immobilizer Activity Tolerance: Patient tolerated treatment well Patient left: in chair;with call bell/phone within reach;with family/visitor present   GP     Jamisen Duerson,KATHrine E 07/31/2012, 11:04 AM Zenovia Jarred, PT, DPT 07/31/2012 Pager: [phone]

## 2012-07-31 NOTE — Progress Notes (Signed)
   Subjective: 2 Days Post-Op Procedure(s) (LRB): LEFT TOTAL KNEE ARTHROPLASTY (Left) Patient reports pain as mild.   Patient seen in rounds for Dr. Lequita Halt. Husband in room. Patient is well, and has had no acute complaints or problems. Nausea has improved. Patient is ready to go home later today.  Objective: Vital signs in last 24 hours: Temp:  [98.3 F (36.8 C)-99.6 F (37.6 C)] 98.3 F (36.8 C) (05/14 0550) Pulse Rate:  [73-79] 75 (05/14 0550) Resp:  [14-18] 18 (05/14 0736) BP: (125-144)/(63-72) 125/63 mmHg (05/14 0550) SpO2:  [92 %-97 %] 93 % (05/14 0736)  Intake/Output from previous day:  Intake/Output Summary (Last 24 hours) at 07/31/12 1033 Last data filed at 07/31/12 0754  Gross per 24 hour  Intake   2100 ml  Output   3650 ml  Net  -1550 ml    Intake/Output this shift: Total I/O In: 480 [P.O.:480] Out: -   Labs:  Recent Labs  07/30/12 0415 07/31/12 0353  HGB 9.9* 10.2*    Recent Labs  07/30/12 0415 07/31/12 0353  WBC 9.6 12.7*  RBC 3.25* 3.41*  HCT 29.3* 30.5*  PLT 225 250    Recent Labs  07/30/12 0415 07/31/12 0353  NA 136 139  K 3.2* 3.4*  CL 100 102  CO2 30 30  BUN 6 8  CREATININE 0.50 0.43*  GLUCOSE 101* 142*  CALCIUM 8.0* 8.8   No results found for this basename: LABPT, INR,  in the last 72 hours  EXAM: General - Patient is Alert, Appropriate and Oriented Extremity - Neurovascular intact Sensation intact distally Dorsiflexion/Plantar flexion intact No cellulitis present Incision - clean, dry, no drainage, healing Motor Function - intact, moving foot and toes well on exam.   Assessment/Plan: 2 Days Post-Op Procedure(s) (LRB): LEFT TOTAL KNEE ARTHROPLASTY (Left) Procedure(s) (LRB): LEFT TOTAL KNEE ARTHROPLASTY (Left) Past Medical History  Diagnosis Date  . Hypertension   . TMJ (temporomandibular joint disorder)     PAST HX GRINDING TEETH --BUT NO LONGER A PROBLEM  . Dyspepsia   . Migraine headache     much reduced  frequency  . History of PSVT (paroxysmal supraventricular tachycardia)     can control by doing a valsalva. Triggered by elevated heart rate  . 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 GERD (gastroesophageal reflux disease)   . Arthritis     OA BOTH KNEES; OCCAS PAIN LEFT SHOULDER - HX OF ROTATOR CUFF PROBLEM   Principal Problem:   OA (osteoarthritis) of knee Active Problems:   Postoperative anemia due to acute blood loss   Hypokalemia  Estimated body mass index is 26.81 kg/(m^2) as calculated from the following:   Height as of this encounter: 5\' 6"  (1.676 m).   Weight as of this encounter: 75.297 kg (166 lb). Up with therapy Discharge home with home health Diet - Cardiac diet Follow up - in 2 weeks Activity - WBAT Disposition - Home Condition Upon Discharge - Good D/C Meds - See DC Summary DVT Prophylaxis - Xarelto  Nolita Kutter 07/31/2012, 10:33 AM

## 2012-07-31 NOTE — Discharge Summary (Signed)
Physician Discharge Summary   Patient ID: Kaylee Davis MRN: [id number] DOB/AGE: 74-May-1942 74 y.o.  Admit date: 07/29/2012 Discharge date: 07/31/2012  Primary Diagnosis:  Osteoarthritis Left knee  Admission Diagnoses:  Past Medical History  Diagnosis Date  . Hypertension   . TMJ (temporomandibular joint disorder)     PAST HX GRINDING TEETH --BUT NO LONGER A PROBLEM  . Dyspepsia   . Migraine headache     much reduced frequency  . History of PSVT (paroxysmal supraventricular tachycardia)     can control by doing a valsalva. Triggered by elevated heart rate  . 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 GERD (gastroesophageal reflux disease)   . Arthritis     OA BOTH KNEES; OCCAS PAIN LEFT SHOULDER - HX OF ROTATOR CUFF PROBLEM   Discharge Diagnoses:   Principal Problem:   OA (osteoarthritis) of knee Active Problems:   Postoperative anemia due to acute blood loss   Hypokalemia  Estimated body mass index is 26.81 kg/(m^2) as calculated from the following:   Height as of this encounter: 5\' 6"  (1.676 m).   Weight as of this encounter: 75.297 kg (166 lb).  Procedure:  Procedure(s) (LRB): LEFT TOTAL KNEE ARTHROPLASTY (Left)   Consults: None  HPI: Kaylee Davis is a 72 y.o. year old female with end stage OA of her left knee with progressively worsening pain and dysfunction. She has constant pain, with activity and at rest and significant functional deficits with difficulties even with ADLs. She has had extensive non-op management including analgesics, injections of cortisone and viscosupplements, and home exercise program, but remains in significant pain with significant dysfunction. Radiographs show bone on bone arthritis lateral and patellofemoral with significant valgus deformity. She presents now for left Total Knee Arthroplasty.   Laboratory Data: Admission on 07/29/2012  Component Date Value Range Status  . WBC 07/30/2012  9.6  4.0 - 10.5 K/uL Final  . RBC 07/30/2012 3.25* 3.87 - 5.11 MIL/uL Final  . Hemoglobin 07/30/2012 9.9* 12.0 - 15.0 g/dL Final  . HCT 40/98/1191 29.3* 36.0 - 46.0 % Final  . MCV 07/30/2012 90.2  78.0 - 100.0 fL Final  . MCH 07/30/2012 30.5  26.0 - 34.0 pg Final  . MCHC 07/30/2012 33.8  30.0 - 36.0 g/dL Final  . RDW 47/82/9562 12.2  11.5 - 15.5 % Final  . Platelets 07/30/2012 225  150 - 400 K/uL Final  . Sodium 07/30/2012 136  135 - 145 mEq/L Final  . Potassium 07/30/2012 3.2* 3.5 - 5.1 mEq/L Final  . Chloride 07/30/2012 100  96 - 112 mEq/L Final  . CO2 07/30/2012 30  19 - 32 mEq/L Final  . Glucose, Bld 07/30/2012 101* 70 - 99 mg/dL Final  . BUN 13/10/6576 6  6 - 23 mg/dL Final  . Creatinine, Ser 07/30/2012 0.50  0.50 - 1.10 mg/dL Final  . Calcium 46/96/2952 8.0* 8.4 - 10.5 mg/dL Final  . GFR calc non Af Amer 07/30/2012 >90  >90 mL/min Final  . GFR calc Af Amer 07/30/2012 >90  >90 mL/min Final   Comment:                                 The eGFR has been calculated  using the CKD EPI equation.                          This calculation has not been                          validated in all clinical                          situations.                          eGFR's persistently                          <90 mL/min signify                          possible Chronic Kidney Disease.  . WBC 07/31/2012 12.7* 4.0 - 10.5 K/uL Final  . RBC 07/31/2012 3.41* 3.87 - 5.11 MIL/uL Final  . Hemoglobin 07/31/2012 10.2* 12.0 - 15.0 g/dL Final  . HCT 16/12/9602 30.5* 36.0 - 46.0 % Final  . MCV 07/31/2012 89.4  78.0 - 100.0 fL Final  . MCH 07/31/2012 29.9  26.0 - 34.0 pg Final  . MCHC 07/31/2012 33.4  30.0 - 36.0 g/dL Final  . RDW 54/11/8117 12.3  11.5 - 15.5 % Final  . Platelets 07/31/2012 250  150 - 400 K/uL Final  . Sodium 07/31/2012 139  135 - 145 mEq/L Final  . Potassium 07/31/2012 3.4* 3.5 - 5.1 mEq/L Final  . Chloride 07/31/2012 102  96 - 112 mEq/L Final  . CO2  07/31/2012 30  19 - 32 mEq/L Final  . Glucose, Bld 07/31/2012 142* 70 - 99 mg/dL Final  . BUN 14/78/2956 8  6 - 23 mg/dL Final  . Creatinine, Ser 07/31/2012 0.43* 0.50 - 1.10 mg/dL Final  . Calcium 21/30/8657 8.8  8.4 - 10.5 mg/dL Final  . GFR calc non Af Amer 07/31/2012 >90  >90 mL/min Final  . GFR calc Af Amer 07/31/2012 >90  >90 mL/min Final   Comment:                                 The eGFR has been calculated                          using the CKD EPI equation.                          This calculation has not been                          validated in all clinical                          situations.                          eGFR's persistently                          <90 mL/min signify  possible Chronic Kidney Disease.  Hospital Outpatient Visit on 07/19/2012  Component Date Value Range Status  . MRSA, PCR 07/19/2012 INVALID RESULTS, SPECIMEN SENT FOR CULTURE* NEGATIVE Final   C. PHILLIPS RN AT 1305 ON 05.02.14 BY SHUEA  . Staphylococcus aureus 07/19/2012 INVALID RESULTS, SPECIMEN SENT FOR CULTURE* NEGATIVE Final   Comment: C. PHILLIPS RN AT 1305 ON 05.02.14 BY SHUEA                                                          The Xpert SA Assay (FDA                          approved for NASAL specimens                          in patients over 74 years of age),                          is one component of                          a comprehensive surveillance                          program.  Test performance has                          been validated by [employer] for patients greater                          than or equal to 68 year old.                          It is not intended                          to diagnose infection nor to                          guide or monitor treatment.  Marland Kitchen aPTT 07/19/2012 30  24 - 37 seconds Final  . WBC 07/19/2012 8.5  4.0 - 10.5 K/uL Final  . RBC 07/19/2012 4.51  3.87 - 5.11 MIL/uL Final   . Hemoglobin 07/19/2012 13.6  12.0 - 15.0 g/dL Final  . HCT 16/12/9602 40.6  36.0 - 46.0 % Final  . MCV 07/19/2012 90.0  78.0 - 100.0 fL Final  . MCH 07/19/2012 30.2  26.0 - 34.0 pg Final  . MCHC 07/19/2012 33.5  30.0 - 36.0 g/dL Final  . RDW 54/11/8117 12.3  11.5 - 15.5 % Final  . Platelets 07/19/2012 341  150 - 400 K/uL Final  . Sodium 07/19/2012 139  135 - 145 mEq/L Final  . Potassium 07/19/2012 4.7  3.5 - 5.1 mEq/L Final  . Chloride 07/19/2012 102  96 - 112 mEq/L Final  . CO2  07/19/2012 29  19 - 32 mEq/L Final  . Glucose, Bld 07/19/2012 103* 70 - 99 mg/dL Final  . BUN 40/98/1191 13  6 - 23 mg/dL Final  . Creatinine, Ser 07/19/2012 0.60  0.50 - 1.10 mg/dL Final  . Calcium 47/82/9562 9.6  8.4 - 10.5 mg/dL Final  . Total Protein 07/19/2012 7.2  6.0 - 8.3 g/dL Final  . Albumin 13/10/6576 3.8  3.5 - 5.2 g/dL Final  . AST 46/96/2952 15  0 - 37 U/L Final  . ALT 07/19/2012 9  0 - 35 U/L Final  . Alkaline Phosphatase 07/19/2012 94  39 - 117 U/L Final  . Total Bilirubin 07/19/2012 0.3  0.3 - 1.2 mg/dL Final  . GFR calc non Af Amer 07/19/2012 88* >90 mL/min Final  . GFR calc Af Amer 07/19/2012 >90  >90 mL/min Final   Comment:                                 The eGFR has been calculated                          using the CKD EPI equation.                          This calculation has not been                          validated in all clinical                          situations.                          eGFR's persistently                          <90 mL/min signify                          possible Chronic Kidney Disease.  Marland Kitchen Prothrombin Time 07/19/2012 12.0  11.6 - 15.2 seconds Final  . INR 07/19/2012 0.89  0.00 - 1.49 Final  . ABO/RH(D) 07/19/2012 O POS   Final  . Antibody Screen 07/19/2012 NEG   Final  . Sample Expiration 07/19/2012 08/01/2012   Final  . Color, Urine 07/19/2012 YELLOW  YELLOW Final  . APPearance 07/19/2012 CLEAR  CLEAR Final  . Specific Gravity, Urine 07/19/2012  1.009  1.005 - 1.030 Final  . pH 07/19/2012 5.0  5.0 - 8.0 Final  . Glucose, UA 07/19/2012 NEGATIVE  NEGATIVE mg/dL Final  . Hgb urine dipstick 07/19/2012 NEGATIVE  NEGATIVE Final  . Bilirubin Urine 07/19/2012 NEGATIVE  NEGATIVE Final  . Ketones, ur 07/19/2012 NEGATIVE  NEGATIVE mg/dL Final  . Protein, ur 84/13/2440 NEGATIVE  NEGATIVE mg/dL Final  . Urobilinogen, UA 07/19/2012 0.2  0.0 - 1.0 mg/dL Final  . Nitrite 01/14/2535 NEGATIVE  NEGATIVE Final  . Leukocytes, UA 07/19/2012 NEGATIVE  NEGATIVE Final   MICROSCOPIC NOT DONE ON URINES WITH NEGATIVE PROTEIN, BLOOD, LEUKOCYTES, NITRITE, OR GLUCOSE <1000 mg/dL.  . ABO/RH(D) 07/19/2012 O POS   Final  . Specimen Description 07/19/2012 NOSE   Final  . Special Requests 07/19/2012 NONE   Final  . Culture 07/19/2012  Final                   Value:NO STAPHYLOCOCCUS AUREUS ISOLATED                         Note: No MRSA Isolated  . Report Status 07/19/2012 07/21/2012 FINAL   Final     X-Rays:Dg Chest 2 View  07/19/2012   *RADIOLOGY REPORT*  Clinical Data: Preoperative assessment for left total knee arthroplasty, history hypertension  CHEST - 2 VIEW  Comparison: None  Findings: Normal heart size, mediastinal contours, and pulmonary vascularity. Lungs clear. No pleural effusion or pneumothorax. Advanced left glenohumeral degenerative changes.  IMPRESSION: No acute abnormalities.   Original Report Authenticated By: Ulyses Southward, M.D.    EKG: Orders placed during the hospital encounter of 07/19/12  . EKG 12-LEAD  . EKG 12-LEAD     Hospital Course: Kaylee Davis is a 72 y.o. who was admitted to [facility] They were brought to the operating room on 07/29/2012 and underwent Procedure(s): LEFT TOTAL KNEE ARTHROPLASTY.  Patient tolerated the procedure well and was later transferred to the recovery room and then to the orthopaedic floor for postoperative care.  They were given PO and IV analgesics for pain control following their surgery.   They were given 24 hours of postoperative antibiotics of  Anti-infectives   Start     Dose/Rate Route Frequency Ordered Stop   07/29/12 1400  ceFAZolin (ANCEF) IVPB 1 g/50 mL premix     1 g 100 mL/hr over 30 Minutes Intravenous 4 times per day 07/29/12 1103 07/29/12 1806   07/29/12 0615  ceFAZolin (ANCEF) IVPB 2 g/50 mL premix     2 g 100 mL/hr over 30 Minutes Intravenous On call to O.R. 07/29/12 1610 07/29/12 0831     and started on DVT prophylaxis in the form of Xarelto.   PT and OT were ordered for total joint protocol.  Discharge planning consulted to help with postop disposition and equipment needs.  Patient had a rough night on the evening of surgery with knee and back pain.  They started to get up OOB with therapy on day one despite having some nausea. Hemovac drain was pulled without difficulty.  Continued to work with therapy into day two.  Dressing was changed on day two and the incision was healing well. Patient was seen in rounds and was ready to go home later that same day.   Discharge Medications: Prior to Admission medications   Medication Sig Start Date End Date Taking? Authorizing Provider  amLODipine (NORVASC) 10 MG tablet Take 10 mg by mouth every morning.   Yes Historical Provider, MD  bisacodyl (DULCOLAX) 10 MG suppository Place 10 mg rectally as needed for constipation.   Yes Historical Provider, MD  esomeprazole (NEXIUM) 40 MG capsule Take 40 mg by mouth daily before breakfast.   Yes Historical Provider, MD  furosemide (LASIX) 40 MG tablet Take 40 mg by mouth daily.   Yes Historical Provider, MD  trolamine salicylate (ASPERCREME) 10 % cream Apply 1 application topically daily as needed. Apply to knees   Yes Historical Provider, MD  celecoxib (CELEBREX) 100 MG capsule Take 100 mg by mouth daily.    Historical Provider, MD  HYDROcodone-acetaminophen (NORCO/VICODIN) 5-325 MG per tablet Take 1-2 tablets by mouth every 4 (four) hours as needed. 07/31/12   Alexzandrew Julien Girt,  PA-C  methocarbamol (ROBAXIN) 500 MG tablet Take 1 tablet (500 mg total) by  mouth every 6 (six) hours as needed. 07/31/12   Alexzandrew Julien Girt, PA-C  rivaroxaban (XARELTO) 10 MG TABS tablet Take 1 tablet (10 mg total) by mouth daily with breakfast. Take Xarelto for two and a half more weeks, then discontinue Xarelto. 07/31/12   Alexzandrew Perkins, PA-C  traMADol (ULTRAM) 50 MG tablet Take 1-2 tablets (50-100 mg total) by mouth every 6 (six) hours as needed (mild pain). 07/31/12   Alexzandrew Julien Girt, PA-C    Diet: Cardiac diet Activity:WBAT Follow-up:in 2 weeks Disposition - Home Discharged Condition: good   Discharge Orders   Future Appointments Provider Department Dept Phone   04/30/2013 1:00 PM Alison Murray, MD [facility] [phone]   Future Orders Complete By Expires     Call MD / Call 911  As directed     Comments:      If you experience chest pain or shortness of breath, CALL 911 and be transported to the hospital emergency room.  If you develope a fever above 101 F, pus (white drainage) or increased drainage or redness at the wound, or calf pain, call your surgeon's office.    Change dressing  As directed     Comments:      Change dressing daily with sterile 4 x 4 inch gauze dressing and apply TED hose. Do not submerge the incision under water.    Constipation Prevention  As directed     Comments:      Drink plenty of fluids.  Prune juice may be helpful.  You may use a stool softener, such as Colace (over the counter) 100 mg twice a day.  Use MiraLax (over the counter) for constipation as needed.    Diet - low sodium heart healthy  As directed     Discharge instructions  As directed     Comments:      Pick up stool softner and laxative for home. Do not submerge incision under water. May shower. Continue to use ice for pain and swelling from surgery.  Take Xarelto for two and a half more weeks, then discontinue Xarelto.    Do not put a pillow under the  knee. Place it under the heel.  As directed     Do not sit on low chairs, stoools or toilet seats, as it may be difficult to get up from low surfaces  As directed     Driving restrictions  As directed     Comments:      No driving until released by the physician.    Increase activity slowly as tolerated  As directed     Lifting restrictions  As directed     Comments:      No lifting until released by the physician.    Patient may shower  As directed     Comments:      You may shower without a dressing once there is no drainage.  Do not wash over the wound.  If drainage remains, do not shower until drainage stops.    TED hose  As directed     Comments:      Use stockings (TED hose) for 3 weeks on both leg(s).  You may remove them at night for sleeping.    Weight bearing as tolerated  As directed         Medication List    STOP taking these medications       estrogens (conjugated) 0.625 MG tablet  Commonly known as:  PREMARIN  TAKE these medications       amLODipine 10 MG tablet  Commonly known as:  NORVASC  Take 10 mg by mouth every morning.     bisacodyl 10 MG suppository  Commonly known as:  DULCOLAX  Place 10 mg rectally as needed for constipation.     celecoxib 100 MG capsule  Commonly known as:  CELEBREX  Take 100 mg by mouth daily.     esomeprazole 40 MG capsule  Commonly known as:  NEXIUM  Take 40 mg by mouth daily before breakfast.     furosemide 40 MG tablet  Commonly known as:  LASIX  Take 40 mg by mouth daily.     HYDROcodone-acetaminophen 5-325 MG per tablet  Commonly known as:  NORCO/VICODIN  Take 1-2 tablets by mouth every 4 (four) hours as needed.     methocarbamol 500 MG tablet  Commonly known as:  ROBAXIN  Take 1 tablet (500 mg total) by mouth every 6 (six) hours as needed.     rivaroxaban 10 MG Tabs tablet  Commonly known as:  XARELTO  Take 1 tablet (10 mg total) by mouth daily with breakfast. Take Xarelto for two and a half more weeks,  then discontinue Xarelto.     traMADol 50 MG tablet  Commonly known as:  ULTRAM  Take 1-2 tablets (50-100 mg total) by mouth every 6 (six) hours as needed (mild pain).     trolamine salicylate 10 % cream  Commonly known as:  ASPERCREME  Apply 1 application topically daily as needed. Apply to knees           Follow-up Information   Follow up with Loanne Drilling, MD. Schedule an appointment as soon as possible for a visit on 08/13/2012.   Contact information:   [address] [place] [address] [phone]       Signed: Patrica Duel 07/31/2012, 10:37 AM

## 2012-10-10 ENCOUNTER — Other Ambulatory Visit: Payer: Self-pay

## 2012-10-10 MED ORDER — FUROSEMIDE 40 MG PO TABS: 40.0000 mg | ORAL_TABLET | Freq: Every day | ORAL | Status: DC

## 2012-10-10 MED ORDER — CELECOXIB 100 MG PO CAPS: 100.0000 mg | ORAL_CAPSULE | Freq: Every day | ORAL | Status: DC

## 2012-10-10 MED ORDER — AMLODIPINE BESYLATE 10 MG PO TABS: 10.0000 mg | ORAL_TABLET | Freq: Every morning | ORAL | Status: DC

## 2012-10-10 MED ORDER — ESOMEPRAZOLE MAGNESIUM 40 MG PO CPDR: 40.0000 mg | DELAYED_RELEASE_CAPSULE | Freq: Every day | ORAL | Status: DC

## 2012-12-17 ENCOUNTER — Ambulatory Visit (INDEPENDENT_AMBULATORY_CARE_PROVIDER_SITE_OTHER): Payer: Medicare Other

## 2012-12-17 DIAGNOSIS — Z23 Encounter for immunization: Secondary | ICD-10-CM

## 2013-02-05 ENCOUNTER — Encounter: Payer: Self-pay | Admitting: Internal Medicine

## 2013-02-05 ENCOUNTER — Ambulatory Visit (INDEPENDENT_AMBULATORY_CARE_PROVIDER_SITE_OTHER): Payer: Medicare Other | Admitting: Internal Medicine

## 2013-02-05 ENCOUNTER — Other Ambulatory Visit (INDEPENDENT_AMBULATORY_CARE_PROVIDER_SITE_OTHER): Payer: Medicare Other

## 2013-02-05 VITALS — BP 154/82 | HR 76 | Temp 98.2°F | Wt 161.0 lb

## 2013-02-05 DIAGNOSIS — E559 Vitamin D deficiency, unspecified: Secondary | ICD-10-CM

## 2013-02-05 DIAGNOSIS — R7989 Other specified abnormal findings of blood chemistry: Secondary | ICD-10-CM

## 2013-02-05 DIAGNOSIS — I10 Essential (primary) hypertension: Secondary | ICD-10-CM

## 2013-02-05 DIAGNOSIS — D62 Acute posthemorrhagic anemia: Secondary | ICD-10-CM

## 2013-02-05 DIAGNOSIS — Z23 Encounter for immunization: Secondary | ICD-10-CM

## 2013-02-05 DIAGNOSIS — M1990 Unspecified osteoarthritis, unspecified site: Secondary | ICD-10-CM

## 2013-02-05 LAB — HEMOGLOBIN AND HEMATOCRIT, BLOOD: Hemoglobin: 13.1 g/dL (ref 12.0–15.0)

## 2013-02-05 NOTE — Progress Notes (Signed)
Pre visit review using our clinic review tool, if applicable. No additional management support is needed unless otherwise documented below in the visit note. 

## 2013-02-05 NOTE — Patient Instructions (Signed)
So glad your knee surgery went well. I too am a believer in successful orthopedic procedures.  Please do give consideration to colon cancer screening.  Labs today and results will be posted to MyChart within 48 hrs.  Today you will get a Prevnar vaccine that completes your immunization against community acquired pneumonia.  Pay attention to your Blood pressure and let me know if it is drifting upward.

## 2013-02-05 NOTE — Progress Notes (Signed)
  Subjective:    Patient ID: Kaylee Davis, female    DOB: 07/18/1940, 72 y.o.   MRN: [id number]  HPI Had left TKR in May '14 - it was a tough 3 months but she is doing well now. Reviewed hospital labs: normal Bmet except for one elevated sereum glucose. She has not had lipids checked since 2012 when LDL was borderline high. She did have vitamin D deficiency per gyn office and did get replacement. She is due for follow up lab.   BP- reviewed last 3 years on record - generally well controlled. She reports that her readings at home are good.   PMH, FamHx and SocHx reviewed for any changes and relevance.   Review of Systems System review is negative for any constitutional, cardiac, pulmonary, GI or neuro symptoms or complaints other than as described in the HPI.     Objective:   Physical Exam Filed Vitals:   02/05/13 1316  BP: 154/82  Pulse: 76  Temp: 98.2 F (36.8 C)   Gen'l- WNWD HEENT- normal Cor- RRR Pulm - CTAP  Recent Results (from the past 2160 hour(s))  HEMOGLOBIN AND HEMATOCRIT, BLOOD     Status: None   Collection Time    02/05/13  1:54 PM      Result Value Range   Hemoglobin 13.1  12.0 - 15.0 g/dL   HCT 40.9  81.1 - 91.4 %  HEMOGLOBIN A1C     Status: None   Collection Time    02/05/13  1:54 PM      Result Value Range   Hemoglobin A1C 5.4  4.6 - 6.5 %   Comment: Glycemic Control Guidelines for People with Diabetes:Non Diabetic:  <6%Goal of Therapy: <7%Additional Action Suggested:  >8%         Assessment & Plan:

## 2013-02-06 LAB — VITAMIN D 25 HYDROXY (VIT D DEFICIENCY, FRACTURES): Vit D, 25-Hydroxy: 33 ng/mL (ref 30–89)

## 2013-02-06 NOTE — Assessment & Plan Note (Signed)
BP Readings from Last 3 Encounters:  02/05/13 154/82  07/31/12 119/67  07/31/12 119/67   Generally good control. May 14,'14 readings were peri-operative and lower than usual.  Plan Continue present medications.

## 2013-02-06 NOTE — Assessment & Plan Note (Signed)
She had left TKR May '14 and has done very well.

## 2013-02-06 NOTE — Assessment & Plan Note (Signed)
Lab Results  Component Value Date   HGBA1C 5.4 02/05/2013   No glucose metabolism problem.

## 2013-02-06 NOTE — Assessment & Plan Note (Signed)
Lab Results  Component Value Date   HGB 13.1 02/05/2013   Full recovery from peri-operative blood loss.

## 2013-02-07 ENCOUNTER — Encounter: Payer: Self-pay | Admitting: Internal Medicine

## 2013-04-14 ENCOUNTER — Encounter: Payer: Self-pay | Admitting: Obstetrics and Gynecology

## 2013-04-30 ENCOUNTER — Ambulatory Visit: Payer: Self-pay | Admitting: Obstetrics and Gynecology

## 2013-05-07 ENCOUNTER — Encounter: Payer: Self-pay | Admitting: Gynecology

## 2013-05-07 ENCOUNTER — Ambulatory Visit (INDEPENDENT_AMBULATORY_CARE_PROVIDER_SITE_OTHER): Payer: Medicare Other | Admitting: Gynecology

## 2013-05-07 ENCOUNTER — Ambulatory Visit: Payer: Self-pay | Admitting: Gynecology

## 2013-05-07 VITALS — BP 148/73 | HR 89 | Resp 18 | Ht 66.75 in | Wt 167.0 lb

## 2013-05-07 DIAGNOSIS — Z01419 Encounter for gynecological examination (general) (routine) without abnormal findings: Secondary | ICD-10-CM

## 2013-05-07 DIAGNOSIS — N951 Menopausal and female climacteric states: Secondary | ICD-10-CM

## 2013-05-07 MED ORDER — ESTROGENS CONJUGATED 0.625 MG PO TABS: 0.6250 mg | ORAL_TABLET | Freq: Every day | ORAL | Status: DC

## 2013-05-07 NOTE — Patient Instructions (Signed)

## 2013-05-07 NOTE — Progress Notes (Signed)
73 y.o. Married Caucasian female   G2P2002 here for annual exam. Pt reports menses are absent to Hysterectomy regular.  She does not report post-menopasual bleeding.  Pt is very happy on estrogen and is  Not interested in stopping or change in medications.  No LMP recorded. Patient is postmenopausal.          Sexually active: yes  The current method of family planning is status post hysterectomy.    Exercising: yes  Walk, weights  Last pap: 2003 Normal  Abnormal PAP: no Mammogram: 03/31/13 Bi-Rads 1 BSE: Colonoscopy: none DEXA: 06/15/09 -0.2/-1.4 Alcohol: no Tobacco: no  Labs: Oliver Barre, MD  Health Maintenance  Topic Date Due  . Colonoscopy  06/01/1988  . Influenza Vaccine  10/18/2013  . Mammogram  04/01/2015  . Tetanus/tdap  12/12/2018  . Pneumococcal Polysaccharide Vaccine Age 70 And Over  Completed  . Zostavax  Completed    Family History  Problem Relation Age of Onset  . Cancer Neg Hx     Breast/Colon  . Diabetes Neg Hx   . Heart disease Neg Hx     CAD/MI    Patient Active Problem List   Diagnosis Date Noted  . Postoperative anemia due to acute blood loss 07/30/2012  . Routine health maintenance 02/03/2011  . OSTEOARTHROS UNSPEC WHETHER GEN/LOC UNSPEC SITE 12/13/2009  . Other abnormal blood chemistry 07/24/2007  . MIGRAINE HEADACHE 02/26/2007  . HYPERTENSION 02/26/2007  . PSVT 02/26/2007  . DYSPEPSIA, HX OF 02/26/2007  . TOTAL ABDOMINAL HYSTERECTOMY, HX OF 02/26/2007  . APPENDECTOMY, HX OF 02/26/2007  . ARTHROSCOPY, LEFT KNEE, HX OF 02/26/2007  . TEMPOROMANDIBULAR JOINT DISORDER 12/06/2006    Past Medical History  Diagnosis Date  . Hypertension   . TMJ (temporomandibular joint disorder)     PAST HX GRINDING TEETH --BUT NO LONGER A PROBLEM  . Dyspepsia   . Migraine headache     much reduced frequency  . History of PSVT (paroxysmal supraventricular tachycardia)     can control by doing a valsalva. Triggered by elevated heart rate  . GI bleed      GI BLEED FROM DUODENAL ULCER--ABOUT 17 YRS AGO - GIVEN TRANSFUSIONS  . GERD (gastroesophageal reflux disease)   . Arthritis     OA BOTH KNEES; OCCAS PAIN LEFT SHOULDER - HX OF ROTATOR CUFF PROBLEM  . Tachycardia     Episodic    Past Surgical History  Procedure Laterality Date  . Arthroplasty      left Knee   . Tonsillectomy    . 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 Abdominal hysterectomy      TAH/BSO ? Fibroids  . Laparoscopic appendectomy  02/2000  . Knee arthroscopy Left 10/2005  . Wisdom tooth extraction  2008    Allergies: Other; Penicillins; and Sulfonamide derivatives  Current Outpatient Prescriptions  Medication Sig Dispense Refill  . amLODipine (NORVASC) 10 MG tablet Take 1 tablet (10 mg total) by mouth every morning.  90 tablet  3  . Calcium Carbonate-Vitamin D (CALCIUM-D PO) Take 1,000 mg by mouth.      . celecoxib (CELEBREX) 100 MG capsule Take 1 capsule (100 mg total) by mouth daily.  90 capsule  3  . esomeprazole (NEXIUM) 40 MG capsule Take 1 capsule (40 mg total) by mouth daily before breakfast.  90 capsule  3  . Estrogens  Conjugated (PREMARIN PO) Take 0.625 mg by mouth.       . furosemide (LASIX) 40 MG tablet Take 1 tablet (40 mg total) by mouth daily.  90 tablet  3  . ergocalciferol (VITAMIN D2) 50000 UNITS capsule Take 50,000 Units by mouth every 30 (thirty) days.       No current facility-administered medications for this visit.    ROS: Pertinent items are noted in HPI.  Exam:    There were no vitals taken for this visit. Weight change: @WEIGHTCHANGE @ Last 3 height recordings:  Ht Readings from Last 3 Encounters:  07/29/12 5\' 6"  (1.676 m)  07/29/12 5\' 6"  (1.676 m)  07/19/12 5\' 6"  (1.676 m)   General appearance: alert, cooperative and appears stated age Head: Normocephalic, without obvious abnormality,  atraumatic Neck: no adenopathy, no carotid bruit, no JVD, supple, symmetrical, trachea midline and thyroid not enlarged, symmetric, no tenderness/mass/nodules Lungs: clear to auscultation bilaterally Breasts: normal appearance, no masses or tenderness Heart: regular rate and rhythm, S1, S2 normal, no murmur, click, rub or gallop Abdomen: soft, non-tender; bowel sounds normal; no masses,  no organomegaly Extremities: extremities normal, atraumatic, no cyanosis or edema Skin: Skin color, texture, turgor normal. No rashes or lesions Lymph nodes: Cervical, supraclavicular, and axillary nodes normal. no inguinal nodes palpated Neurologic: Grossly normal   Pelvic: External genitalia:  no lesions              Urethra: normal appearing urethra with no masses, tenderness or lesions              Bartholins and Skenes: normal                 Vagina: atrophic              Cervix: absent              Pap taken: no        Bimanual Exam:  Uterus:  absent                                      Adnexa:    no masses                                      Rectovaginal: Confirms                                      Anus:  normal sphincter tone, no lesions  A: well woman no contraindication to continue hormonal therapy     P: mammogram annually Pt is not interested in stopping her premarin.  We reviewed alternatives and non-hormonal options.  Pt is aware of the findings of the [facility], and would still like to continue.  Is not interested in decreasing dose.  ACOG statement reivewed DEXA next year counseled on breast self exam, mammography screening, adequate intake of calcium and vitamin D, diet and exercise return annually or prn Discussed PAP guideline changes, importance of weight bearing exercises, calcium, vit D and balanced diet.  An After Visit Summary was printed and given to the patient.

## 2013-08-06 ENCOUNTER — Other Ambulatory Visit: Payer: Self-pay | Admitting: *Deleted

## 2013-08-06 MED ORDER — CELECOXIB 100 MG PO CAPS: 100.0000 mg | ORAL_CAPSULE | Freq: Every day | ORAL | Status: DC

## 2013-08-06 MED ORDER — FUROSEMIDE 40 MG PO TABS: 40.0000 mg | ORAL_TABLET | Freq: Every day | ORAL | Status: DC

## 2013-08-06 MED ORDER — ESOMEPRAZOLE MAGNESIUM 40 MG PO CPDR: 40.0000 mg | DELAYED_RELEASE_CAPSULE | Freq: Every day | ORAL | Status: DC

## 2013-08-06 MED ORDER — AMLODIPINE BESYLATE 10 MG PO TABS: 10.0000 mg | ORAL_TABLET | Freq: Every morning | ORAL | Status: DC

## 2013-10-20 ENCOUNTER — Other Ambulatory Visit: Payer: Self-pay

## 2013-10-20 MED ORDER — FUROSEMIDE 40 MG PO TABS: 40.0000 mg | ORAL_TABLET | Freq: Every day | ORAL | Status: DC

## 2013-10-20 MED ORDER — AMLODIPINE BESYLATE 10 MG PO TABS: 10.0000 mg | ORAL_TABLET | Freq: Every morning | ORAL | Status: DC

## 2013-10-20 MED ORDER — CELECOXIB 100 MG PO CAPS: 100.0000 mg | ORAL_CAPSULE | Freq: Every day | ORAL | Status: DC

## 2013-10-20 NOTE — Telephone Encounter (Signed)
Former patient of Dr Debby BudNorins Upcoming appointment with Dr Dorise HissKollar on 01/12/14 02/05/2013 last office visit with Dr Debby BudNorins Last full labs 07/30/12 Please advise if refills okay under your name until appointment with Dr Dorise HissKollar

## 2013-10-20 NOTE — Telephone Encounter (Signed)
OK  As written

## 2014-01-12 ENCOUNTER — Other Ambulatory Visit (INDEPENDENT_AMBULATORY_CARE_PROVIDER_SITE_OTHER): Payer: Medicare Other

## 2014-01-12 ENCOUNTER — Ambulatory Visit (INDEPENDENT_AMBULATORY_CARE_PROVIDER_SITE_OTHER): Payer: Medicare Other | Admitting: Internal Medicine

## 2014-01-12 ENCOUNTER — Encounter: Payer: Self-pay | Admitting: Internal Medicine

## 2014-01-12 VITALS — BP 148/82 | HR 69 | Temp 97.8°F | Resp 16 | Ht 66.0 in | Wt 170.8 lb

## 2014-01-12 DIAGNOSIS — Z0000 Encounter for general adult medical examination without abnormal findings: Secondary | ICD-10-CM

## 2014-01-12 DIAGNOSIS — M170 Bilateral primary osteoarthritis of knee: Secondary | ICD-10-CM

## 2014-01-12 DIAGNOSIS — I10 Essential (primary) hypertension: Secondary | ICD-10-CM

## 2014-01-12 DIAGNOSIS — K219 Gastro-esophageal reflux disease without esophagitis: Secondary | ICD-10-CM

## 2014-01-12 DIAGNOSIS — Z23 Encounter for immunization: Secondary | ICD-10-CM

## 2014-01-12 LAB — BASIC METABOLIC PANEL
BUN: 13 mg/dL (ref 6–23)
CO2: 31 mEq/L (ref 19–32)
Calcium: 9.1 mg/dL (ref 8.4–10.5)
Chloride: 101 mEq/L (ref 96–112)
Creatinine, Ser: 0.8 mg/dL (ref 0.4–1.2)
GFR: 75.28 mL/min (ref >60.00)
Glucose, Bld: 93 mg/dL (ref 70–99)
Potassium: 4.2 mEq/L (ref 3.5–5.1)
SODIUM: 138 meq/L (ref 135–145)

## 2014-01-12 MED ORDER — CELECOXIB 100 MG PO CAPS: 100.0000 mg | ORAL_CAPSULE | Freq: Every day | ORAL | Status: DC

## 2014-01-12 MED ORDER — ESOMEPRAZOLE MAGNESIUM 40 MG PO CPDR: 40.0000 mg | DELAYED_RELEASE_CAPSULE | Freq: Every day | ORAL | Status: DC

## 2014-01-12 MED ORDER — AMLODIPINE BESYLATE 10 MG PO TABS: 10.0000 mg | ORAL_TABLET | Freq: Every morning | ORAL | Status: DC

## 2014-01-12 MED ORDER — FUROSEMIDE 40 MG PO TABS
40.0000 mg | ORAL_TABLET | Freq: Every day | ORAL | Status: DC
Start: 2014-01-12 — End: 2015-03-30

## 2014-01-12 NOTE — Patient Instructions (Signed)
We will check blood work today to make sure your kidneys and electrolytes are still normal.   We have given you the flu shot.   Health Maintenance Adopting a healthy lifestyle and getting preventive care can go a long way to promote health and wellness. Talk with your health care provider about what schedule of regular examinations is right for you. This is a good chance for you to check in with your provider about disease prevention and staying healthy. In between checkups, there are plenty of things you can do on your own. Experts have done a lot of research about which lifestyle changes and preventive measures are most likely to keep you healthy. Ask your health care provider for more information. WEIGHT AND DIET  Eat a healthy diet  Be sure to include plenty of vegetables, fruits, low-fat dairy products, and lean protein.  Do not eat a lot of foods high in solid fats, added sugars, or salt.  Get regular exercise. This is one of the most important things you can do for your health.  Most adults should exercise for at least 150 minutes each week. The exercise should increase your heart rate and make you sweat (moderate-intensity exercise).  Most adults should also do strengthening exercises at least twice a week. This is in addition to the moderate-intensity exercise.  Maintain a healthy weight  Body mass index (BMI) is a measurement that can be used to identify possible weight problems. It estimates body fat based on height and weight. Your health care provider can help determine your BMI and help you achieve or maintain a healthy weight.  For females 59 years of age and older:   A BMI below 18.5 is considered underweight.  A BMI of 18.5 to 24.9 is normal.  A BMI of 25 to 29.9 is considered overweight.  A BMI of 30 and above is considered obese.  Watch levels of cholesterol and blood lipids  You should start having your blood tested for lipids and cholesterol at 75 years of  age, then have this test every 5 years.  You may need to have your cholesterol levels checked more often if:  Your lipid or cholesterol levels are high.  You are older than 75 years of age.  You are at high risk for heart disease.  CANCER SCREENING   Breast Cancer  Practice breast self-awareness. This means understanding how your breasts normally appear and feel.  It also means doing regular breast self-exams. Let your health care provider know about any changes, no matter how small.  If you are in your 20s or 30s, you should have a clinical breast exam (CBE) by a health care provider every 1-3 years as part of a regular health exam.  If you are 71 or older, have a CBE every year. Also consider having a breast X-ray (mammogram) every year.  If you have a family history of breast cancer, talk to your health care provider about genetic screening.  If you are at high risk for breast cancer, talk to your health care provider about having an MRI and a mammogram every year.  Breast cancer gene (BRCA) assessment is recommended for women who have family members with BRCA-related cancers. BRCA-related cancers include:  Breast.  Ovarian.  Tubal.  Peritoneal cancers.  Results of the assessment will determine the need for genetic counseling and BRCA1 and BRCA2 testing. Colorectal Cancer  This type of cancer can be detected and often prevented.  Routine colorectal cancer screening usually  begins at 75 years of age and continues through 75 years of age.  Your health care provider may recommend screening at an earlier age if you have risk factors for colon cancer.  Your health care provider may also recommend using home test kits to check for hidden blood in the stool.  A small camera at the end of a tube can be used to examine your colon directly (sigmoidoscopy or colonoscopy). This is done to check for the earliest forms of colorectal cancer.  Routine screening usually begins at  age 68.  Direct examination of the colon should be repeated every 5-10 years through 75 years of age. However, you may need to be screened more often if early forms of precancerous polyps or small growths are found. Skin Cancer  Check your skin from head to toe regularly.  Tell your health care provider about any new moles or changes in moles, especially if there is a change in a mole's shape or color.  Also tell your health care provider if you have a mole that is larger than the size of a pencil eraser.  Always use sunscreen. Apply sunscreen liberally and repeatedly throughout the day.  Protect yourself by wearing long sleeves, pants, a wide-brimmed hat, and sunglasses whenever you are outside. HEART DISEASE, DIABETES, AND HIGH BLOOD PRESSURE   Have your blood pressure checked at least every 1-2 years. High blood pressure causes heart disease and increases the risk of stroke.  If you are between 65 years and 26 years old, ask your health care provider if you should take aspirin to prevent strokes.  Have regular diabetes screenings. This involves taking a blood sample to check your fasting blood sugar level.  If you are at a normal weight and have a low risk for diabetes, have this test once every three years after 75 years of age.  If you are overweight and have a high risk for diabetes, consider being tested at a younger age or more often. PREVENTING INFECTION  Hepatitis B  If you have a higher risk for hepatitis B, you should be screened for this virus. You are considered at high risk for hepatitis B if:  You were born in a country where hepatitis B is common. Ask your health care provider which countries are considered high risk.  Your parents were born in a high-risk country, and you have not been immunized against hepatitis B (hepatitis B vaccine).  You have HIV or AIDS.  You use needles to inject street drugs.  You live with someone who has hepatitis B.  You have had  sex with someone who has hepatitis B.  You get hemodialysis treatment.  You take certain medicines for conditions, including cancer, organ transplantation, and autoimmune conditions. Hepatitis C  Blood testing is recommended for:  Everyone born from [birth year] through 1967.  Anyone with known risk factors for hepatitis C. Sexually transmitted infections (STIs)  You should be screened for sexually transmitted infections (STIs) including gonorrhea and chlamydia if:  You are sexually active and are younger than 75 years of age.  You are older than 75 years of age and your health care provider tells you that you are at risk for this type of infection.  Your sexual activity has changed since you were last screened and you are at an increased risk for chlamydia or gonorrhea. Ask your health care provider if you are at risk.  If you do not have HIV, but are at risk, it may be  recommended that you take a prescription medicine daily to prevent HIV infection. This is called pre-exposure prophylaxis (PrEP). You are considered at risk if:  You are sexually active and do not regularly use condoms or know the HIV status of your partner(s).  You take drugs by injection.  You are sexually active with a partner who has HIV. Talk with your health care provider about whether you are at high risk of being infected with HIV. If you choose to begin PrEP, you should first be tested for HIV. You should then be tested every 3 months for as long as you are taking PrEP.  OSTEOPOROSIS AND MENOPAUSE   Osteoporosis is a disease in which the bones lose minerals and strength with aging. This can result in serious bone fractures. Your risk for osteoporosis can be identified using a bone density scan.  If you are 51 years of age or older, or if you are at risk for osteoporosis and fractures, ask your health care provider if you should be screened.  Ask your health care provider whether you should take a calcium or  vitamin D supplement to lower your risk for osteoporosis.  Menopause may have certain physical symptoms and risks.  Hormone replacement therapy may reduce some of these symptoms and risks. Talk to your health care provider about whether hormone replacement therapy is right for you.  HOME CARE INSTRUCTIONS   Schedule regular health, dental, and eye exams.  Stay current with your immunizations.   Do not use any tobacco products including cigarettes, chewing tobacco, or electronic cigarettes.  If you are pregnant, do not drink alcohol.  If you are breastfeeding, limit how much and how often you drink alcohol.  Limit alcohol intake to no more than 1 drink per day for nonpregnant women. One drink equals 12 ounces of beer, 5 ounces of wine, or 1 ounces of hard liquor.  Do not use street drugs.  Do not share needles.  Ask your health care provider for help if you need support or information about quitting drugs.  Tell your health care provider if you often feel depressed.  Tell your health care provider if you have ever been abused or do not feel safe at home. Document Released: 09/19/2010 Document Revised: 07/21/2013 Document Reviewed: 02/05/2013 [facility] Patient Information 2015 [place], [place]. This information is not intended to replace advice given to you by your health care provider. Make sure you discuss any questions you have with your health care provider.

## 2014-01-12 NOTE — Progress Notes (Signed)
Pre visit review using our clinic review tool, if applicable. No additional management support is needed unless otherwise documented below in the visit note. 

## 2014-01-12 NOTE — Assessment & Plan Note (Signed)
Doing well S/P TKR last year and only uses cane occasionally. Continue celebrex.

## 2014-01-12 NOTE — Assessment & Plan Note (Signed)
Doing well at this time with nexium and she notices if she does not take it.

## 2014-01-12 NOTE — Assessment & Plan Note (Addendum)
BP well controlled and will continue lasix and amlodipine. Check BMP.

## 2014-01-12 NOTE — Assessment & Plan Note (Signed)
Flu shot given today and she declines colonoscopy although she knows she should get it due to fear. Will discuss again at next visit.

## 2014-01-12 NOTE — Progress Notes (Signed)
   Subjective:    Patient ID: Kaylee Davis, female    DOB: Jun 01, 1940, 73 y.o.   MRN: [id number]  HPI The patient is a 73 YO female who is coming in today to establish care. She has PMH of HTN, GERD, osteoarthritis. She is doing well overall and had knee replacement last year. She is mostly walking unassisted at this time but uses cane over uneven or unknown ground. She denies chest pains, SOB, abdominal pain. She denies constipation, diarrhea, blood in stools. She has never had colonoscopy and has a fear of getting one.   Review of Systems  Constitutional: Negative for fever, activity change, appetite change and unexpected weight change.  HENT: Negative.   Respiratory: Negative for cough, chest tightness and shortness of breath.   Cardiovascular: Negative for chest pain, palpitations and leg swelling.  Gastrointestinal: Negative for abdominal pain, diarrhea, constipation, blood in stool and abdominal distention.  Musculoskeletal: Negative.  Negative for gait problem.  Skin: Negative.   Neurological: Negative.   Psychiatric/Behavioral: Negative.       Objective:   Physical Exam  Constitutional: She is oriented to person, place, and time. She appears well-developed and well-nourished.  Overweight  HENT:  Head: Normocephalic and atraumatic.  Eyes: EOM are normal.  Neck: Normal range of motion.  Cardiovascular: Normal rate and regular rhythm.   Pulmonary/Chest: Effort normal and breath sounds normal. No respiratory distress. She has no wheezes. She has no rales.  Abdominal: Soft. Bowel sounds are normal. She exhibits no distension. There is no tenderness. There is no rebound.  Neurological: She is alert and oriented to person, place, and time. Coordination normal.  Skin: Skin is warm and dry.   Filed Vitals:   01/12/14 0807  BP: 148/82  Pulse: 69  Temp: 97.8 F (36.6 C)  TempSrc: Oral  Resp: 16  Height: 5\' 6"  (1.676 m)  Weight: 170 lb 12.8 oz (77.474 kg)  SpO2: 96%     Assessment & Plan:  Flu shot given today.

## 2014-01-19 ENCOUNTER — Encounter: Payer: Self-pay | Admitting: Internal Medicine

## 2014-05-08 ENCOUNTER — Encounter: Payer: Self-pay | Admitting: Nurse Practitioner

## 2014-05-08 ENCOUNTER — Ambulatory Visit (INDEPENDENT_AMBULATORY_CARE_PROVIDER_SITE_OTHER): Payer: Medicare Other | Admitting: Nurse Practitioner

## 2014-05-08 ENCOUNTER — Ambulatory Visit: Payer: Medicare Other | Admitting: Gynecology

## 2014-05-08 VITALS — BP 136/74 | HR 78 | Ht 66.0 in | Wt 170.4 lb

## 2014-05-08 DIAGNOSIS — Z01419 Encounter for gynecological examination (general) (routine) without abnormal findings: Secondary | ICD-10-CM

## 2014-05-08 MED ORDER — ESTROGENS CONJUGATED 0.3 MG PO TABS: ORAL_TABLET | ORAL | Status: DC

## 2014-05-08 NOTE — Patient Instructions (Signed)

## 2014-05-08 NOTE — Progress Notes (Signed)
74 y.o. G47P2002 Married  Caucasian Fe here for annual exam. She feels well and wants to continue on ERT.  Dr. Arthur Holms told her to get Mammo every other year.  Patient's last menstrual period was 03/21/1983.          Sexually active: Yes.    The current method of family planning is status post hysterectomy.    Exercising: No.  walking, walking daily Smoker:  no  Health Maintenance: Pap:  07/10/2001 Negative MMG:  03/31/13 Breast Density Category A ; Bi-Rads 1: Negative Colonoscopy:  Never had one  BMD:   3 or more years ago  TDaP:  12/11/2008 Labs: Genella Mech, MD   reports that she has never smoked. She has never used smokeless tobacco. She reports that she drinks about 2.5 oz of alcohol per week. She reports that she does not use illicit drugs.  Past Medical History  Diagnosis Date  . Hypertension   . TMJ (temporomandibular joint disorder)     PAST HX GRINDING TEETH --BUT NO LONGER A PROBLEM  . Dyspepsia   . Migraine headache     much reduced frequency  . History of PSVT (paroxysmal supraventricular tachycardia)     can control by doing a valsalva. Triggered by elevated heart rate  . 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 GERD (gastroesophageal reflux disease)   . Arthritis     OA BOTH KNEES; OCCAS PAIN LEFT SHOULDER - HX OF ROTATOR CUFF PROBLEM  . Tachycardia     Episodic    Past Surgical History  Procedure Laterality Date  . Arthroplasty      left Knee   . Tonsillectomy    . 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 Abdominal hysterectomy      TAH/BSO ? Fibroids  . Laparoscopic appendectomy  02/2000  . Knee arthroscopy Left 10/2005  . Wisdom tooth extraction  2008    Current Outpatient Prescriptions  Medication Sig Dispense Refill  . amLODipine (NORVASC) 10 MG tablet Take 1 tablet  (10 mg total) by mouth every morning. 90 tablet 3  . Calcium Carbonate-Vitamin D (CALCIUM-D PO) Take 1,000 mg by mouth.    . celecoxib (CELEBREX) 100 MG capsule Take 1 capsule (100 mg total) by mouth daily. 90 capsule 3  . esomeprazole (NEXIUM) 40 MG capsule Take 1 capsule (40 mg total) by mouth daily before breakfast. 90 capsule 3  . furosemide (LASIX) 40 MG tablet Take 1 tablet (40 mg total) by mouth daily. 90 tablet 3  . estrogens, conjugated, (PREMARIN) 0.3 MG tablet Take daily 90 tablet 0   No current facility-administered medications for this visit.    Family History  Problem Relation Age of Onset  . Cancer Neg Hx     Breast/Colon  . Diabetes Neg Hx   . Heart disease Neg Hx     CAD/MI    ROS:  Pertinent items are noted in HPI.  Otherwise, a comprehensive ROS was negative.  Exam:   BP 136/74 mmHg  Pulse 78  Ht  (1.676 m)  Wt 170 lb 6.4 oz (77.293 kg)  BMI 27.52 kg/m2  LMP 03/21/1983 Height:  (167.6 cm) Ht Readings from Last 3 Encounters:  05/08/14  (1.676 m)  01/12/14  (1.676 m)  05/07/13 5'  6.75" (1.695 m)    General appearance: alert, cooperative and appears stated age Head: Normocephalic, without obvious abnormality, atraumatic Neck: no adenopathy, supple, symmetrical, trachea midline and thyroid normal to inspection and palpation Lungs: clear to auscultation bilaterally Breasts: normal appearance, no masses or tenderness Heart: regular rate and rhythm Abdomen: soft, non-tender; no masses,  no organomegaly Extremities: extremities normal, atraumatic, no cyanosis or edema Skin: Skin color, texture, turgor normal. No rashes or lesions Lymph nodes: Cervical, supraclavicular, and axillary nodes normal. No abnormal inguinal nodes palpated Neurologic: Grossly normal   Pelvic: External genitalia:  no lesions              Urethra:  normal appearing urethra with no masses, tenderness or lesions              Bartholin's and Skene's: normal                  Vagina: normal appearing vagina with normal color and discharge, no lesions              Cervix: absent              Pap taken: No. Bimanual Exam:  Uterus:  uterus absent              Adnexa: no mass, fullness, tenderness               Rectovaginal: Confirms               Anus:  normal sphincter tone, no lesions  Chaperone present: No  A:  Well Woman with normal exam  S/P TAH/ BSO 1985 on ERT since  History of HTN  S/P left knee replacement   P:   Reviewed health and wellness pertinent to exam  Pap smear not taken today  Mammogram is past due and she will need to schedule within 90 days to stay on ERT  Counseled patient at length about being on ERT and at a higher dose.  She wanted to continue with same - I was able to have her try a reduced dose and the intentions are to have her to reduce even further to every other day later on.  She was told she was OK to continue ERT for prevention of bone loss, hair thinning, and vaginal dryness.  She was given our current concerns given her age about DVT, CVA, and cancer.  She was only given 3 months via [facility] of new dose of Premarin 0.3 mg daily for 3 months awaiting a current Mammo.  Counseled on breast self exam, mammography screening, use and side effects of HRT, adequate intake of calcium and vitamin D, diet and exercise return annually or prn  An After Visit Summary was printed and given to the patient.

## 2014-05-11 LAB — HM MAMMOGRAPHY

## 2014-05-12 NOTE — Progress Notes (Signed)
Encounter reviewed by Dr. Brook Silva.  

## 2014-05-18 ENCOUNTER — Encounter: Payer: Self-pay | Admitting: Internal Medicine

## 2014-05-18 ENCOUNTER — Ambulatory Visit (INDEPENDENT_AMBULATORY_CARE_PROVIDER_SITE_OTHER): Payer: Medicare Other | Admitting: Internal Medicine

## 2014-05-18 VITALS — BP 144/68 | HR 78 | Temp 98.0°F | Resp 16 | Wt 169.0 lb

## 2014-05-18 DIAGNOSIS — M170 Bilateral primary osteoarthritis of knee: Secondary | ICD-10-CM

## 2014-05-18 DIAGNOSIS — K219 Gastro-esophageal reflux disease without esophagitis: Secondary | ICD-10-CM

## 2014-05-18 MED ORDER — ESOMEPRAZOLE MAGNESIUM 40 MG PO CPDR: 40.0000 mg | DELAYED_RELEASE_CAPSULE | Freq: Every day | ORAL | Status: DC

## 2014-05-18 MED ORDER — MELOXICAM 15 MG PO TABS: 15.0000 mg | ORAL_TABLET | Freq: Every day | ORAL | Status: DC

## 2014-05-18 NOTE — Patient Instructions (Signed)
We have sent in a replacement for the celebrex called meloxicam which works about the same for the arthritis pain. You will still take only 1 pill per day.   If you are not doing well on it please call and let us know.   We will do the prior authorization to get the nexium for you.  The premarin is okay to take the rest of your pills then move to the lower dose pills. I would take the lower dose for at least a month, then you can gradually move to taking it every other day. If you do not notice any symptoms after 1 month you can stop taking it altogether and see if you have any problems. We will check a bone density scan in about 1-2 years to check on the bone strength.   Another option for hair health is biotin if you start noticing any problems (this is an over the counter supplement).

## 2014-05-18 NOTE — Assessment & Plan Note (Signed)
Will try mobic instead of the celebrex as it is covered and she has not tried in the past.

## 2014-05-18 NOTE — Progress Notes (Signed)
Pre visit review using our clinic review tool, if applicable. No additional management support is needed unless otherwise documented below in the visit note. 

## 2014-05-18 NOTE — Assessment & Plan Note (Signed)
Continue the nexium and see if the pharmacy will cover through her insurance. Will re-send the prescription and fill out prior authorization form.

## 2014-05-18 NOTE — Progress Notes (Signed)
   Subjective:    Patient ID: Kaylee Davis, female    DOB: [birth year], 74 y.o.   MRN: [id number]  HPI The patient is a 74 YO female coming in with several medication concerns. Her insurance does not cover her nexium and celebrex and this is concerning to her. She has history of bleeding ulcer and this is why she has needed the nexium. She was not able to tolerate OTC rantidine in the past. She has tried omeprazole over the counter which was not effective. She has been on celebrex for a long time and has not been on another agent. She also has a few questions about her hormone replacement (gynecology is trying to get her off it) which I am able to answer for her.   Review of Systems  Constitutional: Negative for fever, activity change, appetite change and unexpected weight change.  HENT: Negative.   Respiratory: Negative for cough, chest tightness and shortness of breath.   Cardiovascular: Negative for chest pain, palpitations and leg swelling.  Gastrointestinal: Negative for abdominal pain, diarrhea, constipation, blood in stool and abdominal distention.  Musculoskeletal: Negative.  Negative for gait problem.  Skin: Negative.   Neurological: Negative.   Psychiatric/Behavioral: Negative.       Objective:   Physical Exam  Constitutional: She is oriented to person, place, and time. She appears well-developed and well-nourished.  HENT:  Head: Normocephalic and atraumatic.  Eyes: EOM are normal.  Neck: Normal range of motion.  Cardiovascular: Normal rate and regular rhythm.   Pulmonary/Chest: Effort normal and breath sounds normal. No respiratory distress. She has no wheezes. She has no rales.  Abdominal: Soft. Bowel sounds are normal. She exhibits no distension. There is no tenderness. There is no rebound.  Neurological: She is alert and oriented to person, place, and time. Coordination normal.  Skin: Skin is warm and dry.   Filed Vitals:   05/18/14 0822  BP: 144/68  Pulse: 78    Temp: 98 F (36.7 C)  TempSrc: Oral  Resp: 16  Weight: 169 lb (76.658 kg)  SpO2: 96%      Assessment & Plan:

## 2014-05-19 ENCOUNTER — Telehealth: Payer: Self-pay | Admitting: Internal Medicine

## 2014-05-19 MED ORDER — PANTOPRAZOLE SODIUM 40 MG PO TBEC: 40.0000 mg | DELAYED_RELEASE_TABLET | Freq: Every day | ORAL | Status: DC

## 2014-05-19 NOTE — Telephone Encounter (Signed)
Sent to pharmacy. Patient is aware.

## 2014-05-19 NOTE — Telephone Encounter (Signed)
Dr. Dorise HissKollar please advise on an alternative.

## 2014-05-19 NOTE — Telephone Encounter (Signed)
Can try pantoprazole which should be as good for her problem. Sent in.

## 2014-05-19 NOTE — Telephone Encounter (Signed)
Pt called need to speak to the assistant concern about Nexium, pt stated that insurance is not going to cover the one that Dr. Dorise HissKollar change for her but they will cover for Pantoprazole and Omeprazole. Please advise,

## 2014-07-10 ENCOUNTER — Telehealth: Payer: Self-pay

## 2014-07-10 NOTE — Telephone Encounter (Signed)
Patient called to educate on Medicare Wellness apt. LVM for the patient to call back to educate and schedule for wellness visit.   

## 2014-09-01 ENCOUNTER — Encounter: Payer: Self-pay | Admitting: Family

## 2014-09-01 ENCOUNTER — Ambulatory Visit (INDEPENDENT_AMBULATORY_CARE_PROVIDER_SITE_OTHER): Payer: Medicare Other | Admitting: Family

## 2014-09-01 ENCOUNTER — Other Ambulatory Visit: Payer: Medicare Other

## 2014-09-01 VITALS — BP 170/86 | HR 82 | Temp 98.0°F | Resp 18 | Ht 66.0 in | Wt 162.0 lb

## 2014-09-01 DIAGNOSIS — R300 Dysuria: Secondary | ICD-10-CM

## 2014-09-01 LAB — POCT URINALYSIS DIPSTICK
Bilirubin, UA: NEGATIVE
Glucose, UA: NEGATIVE
KETONES UA: NEGATIVE
NITRITE UA: NEGATIVE
Spec Grav, UA: 1.025
UROBILINOGEN UA: NEGATIVE
pH, UA: 6

## 2014-09-01 MED ORDER — PHENAZOPYRIDINE HCL 100 MG PO TABS: 100.0000 mg | ORAL_TABLET | Freq: Three times a day (TID) | ORAL | Status: DC | PRN

## 2014-09-01 MED ORDER — FLUCONAZOLE 150 MG PO TABS: 150.0000 mg | ORAL_TABLET | Freq: Once | ORAL | Status: DC

## 2014-09-01 MED ORDER — CIPROFLOXACIN HCL 250 MG PO TABS: 250.0000 mg | ORAL_TABLET | Freq: Two times a day (BID) | ORAL | Status: DC

## 2014-09-01 NOTE — Progress Notes (Signed)
Pre visit review using our clinic review tool, if applicable. No additional management support is needed unless otherwise documented below in the visit note. 

## 2014-09-01 NOTE — Patient Instructions (Addendum)
Thank you for choosing [facility].  Summary/Instructions:  Your prescription(s) have been submitted to your pharmacy or been printed and provided for you. Please take as directed and contact our office if you believe you are having problem(s) with the medication(s) or have any questions.  If your symptoms worsen or fail to improve, please contact our office for further instruction, or in case of emergency go directly to the emergency room at the closest medical facility.   Urinary Tract Infection Urinary tract infections (UTIs) can develop anywhere along your urinary tract. Your urinary tract is your body's drainage system for removing wastes and extra water. Your urinary tract includes two kidneys, two ureters, a bladder, and a urethra. Your kidneys are a pair of bean-shaped organs. Each kidney is about the size of your fist. They are located below your ribs, one on each side of your spine. CAUSES Infections are caused by microbes, which are microscopic organisms, including fungi, viruses, and bacteria. These organisms are so small that they can only be seen through a microscope. Bacteria are the microbes that most commonly cause UTIs. SYMPTOMS  Symptoms of UTIs may vary by age and gender of the patient and by the location of the infection. Symptoms in young women typically include a frequent and intense urge to urinate and a painful, burning feeling in the bladder or urethra during urination. Older women and men are more likely to be tired, shaky, and weak and have muscle aches and abdominal pain. A fever may mean the infection is in your kidneys. Other symptoms of a kidney infection include pain in your back or sides below the ribs, nausea, and vomiting. DIAGNOSIS To diagnose a UTI, your caregiver will ask you about your symptoms. Your caregiver also will ask to provide a urine sample. The urine sample will be tested for bacteria and white blood cells. White blood cells are made by your  body to help fight infection. TREATMENT  Typically, UTIs can be treated with medication. Because most UTIs are caused by a bacterial infection, they usually can be treated with the use of antibiotics. The choice of antibiotic and length of treatment depend on your symptoms and the type of bacteria causing your infection. HOME CARE INSTRUCTIONS  If you were prescribed antibiotics, take them exactly as your caregiver instructs you. Finish the medication even if you feel better after you have only taken some of the medication.  Drink enough water and fluids to keep your urine clear or pale yellow.  Avoid caffeine, tea, and carbonated beverages. They tend to irritate your bladder.  Empty your bladder often. Avoid holding urine for long periods of time.  Empty your bladder before and after sexual intercourse.  After a bowel movement, women should cleanse from front to back. Use each tissue only once. SEEK MEDICAL CARE IF:   You have back pain.  You develop a fever.  Your symptoms do not begin to resolve within 3 days. SEEK IMMEDIATE MEDICAL CARE IF:   You have severe back pain or lower abdominal pain.  You develop chills.  You have nausea or vomiting.  You have continued burning or discomfort with urination. MAKE SURE YOU:   Understand these instructions.  Will watch your condition.  Will get help right away if you are not doing well or get worse. Document Released: 12/14/2004 Document Revised: 09/05/2011 Document Reviewed: 04/14/2011 Exit[employer] Patient Information 2015 [employer]. This information is not intended to replace advice given to you by your health care provider.   Make sure you discuss any questions you have with your health care provider.   

## 2014-09-01 NOTE — Assessment & Plan Note (Signed)
In office urinalysis positive for leukocytes and negative for nitrates and hematuria. Consistent with uncomplicated urinary tract infection. Urine will be sent for culture. Start ciprofloxacin. Start Pyridium as needed for burning. Start Diflucan as needed for candidiasis following antibiotics. Follow-up if symptoms worsen or fail to improve.

## 2014-09-01 NOTE — Progress Notes (Signed)
Subjective:    Patient ID: Kaylee Davis, female    DOB: 1940-05-11, 74 y.o.   MRN: [id number]  Chief Complaint  Patient presents with  . Dysuria    started yesterday, dysuria, urinary frequency, and pressure in lower abdomen    HPI:  Kaylee Davis is a 74 y.o. female with a PMH of hypertension, PSVT, osteoarthritis, and GERD who presents today for an acute office visit.   Associated symptoms of dysuria started yesterday with the remainder of urinary frequency, pressure and urgency going on for several days. Denies back pain, hematuria, fever or chills. Denies recent antibiotic use.  Allergies  Allergen Reactions  . Other     PT STATES THE SULFA AND PENICILLIN SHE TOOK MANY YEARS AGO BOTH HAD RED DYES--SO SHE WONDERED IF ALLERGIC TO RED DYES  . Penicillins Hives  . Sulfonamide Derivatives Hives    Current Outpatient Prescriptions on File Prior to Visit  Medication Sig Dispense Refill  . amLODipine (NORVASC) 10 MG tablet Take 1 tablet (10 mg total) by mouth every morning. 90 tablet 3  . Calcium Carbonate-Vitamin D (CALCIUM-D PO) Take 1,000 mg by mouth.    . celecoxib (CELEBREX) 100 MG capsule Take 1 capsule (100 mg total) by mouth daily. 90 capsule 3  . esomeprazole (NEXIUM) 40 MG capsule Take 1 capsule (40 mg total) by mouth daily before breakfast. 90 capsule 3  . estrogens, conjugated, (PREMARIN) 0.3 MG tablet Take daily 90 tablet 0  . furosemide (LASIX) 40 MG tablet Take 1 tablet (40 mg total) by mouth daily. 90 tablet 3  . meloxicam (MOBIC) 15 MG tablet Take 1 tablet (15 mg total) by mouth daily. 90 tablet 2  . pantoprazole (PROTONIX) 40 MG tablet Take 1 tablet (40 mg total) by mouth daily. 30 tablet 3   No current facility-administered medications on file prior to visit.    Review of Systems  Genitourinary: Positive for dysuria, urgency and frequency. Negative for hematuria and flank pain.      Objective:    BP 170/86 mmHg  Pulse 82  Temp(Src) 98 F (36.7  C) (Oral)  Resp 18  Ht  (1.676 m)  Wt 162 lb (73.483 kg)  BMI 26.16 kg/m2  SpO2 97%  LMP 03/21/1983 Nursing note and vital signs reviewed.  Physical Exam  Constitutional: She is oriented to person, place, and time. She appears well-developed and well-nourished. No distress.  Cardiovascular: Normal rate, regular rhythm, normal heart sounds and intact distal pulses.   Pulmonary/Chest: Effort normal and breath sounds normal.  Abdominal: There is no CVA tenderness.  Neurological: She is alert and oriented to person, place, and time.  Skin: Skin is warm and dry.  Psychiatric: She has a normal mood and affect. Her behavior is normal. Judgment and thought content normal.       Assessment & Plan:   Problem List Items Addressed This Visit      Other   Dysuria - Primary    In office urinalysis positive for leukocytes and negative for nitrates and hematuria. Consistent with uncomplicated urinary tract infection. Urine will be sent for culture. Start ciprofloxacin. Start Pyridium as needed for burning. Start Diflucan as needed for candidiasis following antibiotics. Follow-up if symptoms worsen or fail to improve.      Relevant Medications   ciprofloxacin (CIPRO) 250 MG tablet   fluconazole (DIFLUCAN) 150 MG tablet   phenazopyridine (PYRIDIUM) 100 MG tablet   Other Relevant Orders   POCT urinalysis dipstick (Completed)  Urine culture

## 2014-09-04 ENCOUNTER — Telehealth: Payer: Self-pay | Admitting: Family

## 2014-09-04 LAB — URINE CULTURE

## 2014-09-04 NOTE — Telephone Encounter (Signed)
Pt aware.

## 2014-09-04 NOTE — Telephone Encounter (Signed)
Please inform patient that her urine culture shows she did have a urinary tract infection and the antibiotic prescribed is appropriate for the bacteria that was present. If she continues to experience symptoms please let us know.

## 2014-10-12 ENCOUNTER — Other Ambulatory Visit: Payer: Self-pay | Admitting: Internal Medicine

## 2014-11-20 ENCOUNTER — Ambulatory Visit (INDEPENDENT_AMBULATORY_CARE_PROVIDER_SITE_OTHER): Payer: Medicare Other | Admitting: Internal Medicine

## 2014-11-20 ENCOUNTER — Other Ambulatory Visit (INDEPENDENT_AMBULATORY_CARE_PROVIDER_SITE_OTHER): Payer: Medicare Other

## 2014-11-20 ENCOUNTER — Encounter: Payer: Self-pay | Admitting: Internal Medicine

## 2014-11-20 VITALS — BP 140/80 | HR 73 | Temp 97.9°F | Resp 12 | Ht 66.0 in | Wt 171.0 lb

## 2014-11-20 DIAGNOSIS — M170 Bilateral primary osteoarthritis of knee: Secondary | ICD-10-CM

## 2014-11-20 DIAGNOSIS — K219 Gastro-esophageal reflux disease without esophagitis: Secondary | ICD-10-CM | POA: Diagnosis not present

## 2014-11-20 DIAGNOSIS — I10 Essential (primary) hypertension: Secondary | ICD-10-CM

## 2014-11-20 LAB — COMPREHENSIVE METABOLIC PANEL
ALT: 10 U/L (ref 0–35)
AST: 16 U/L (ref 0–37)
Albumin: 4.2 g/dL (ref 3.5–5.2)
Alkaline Phosphatase: 95 U/L (ref 39–117)
BILIRUBIN TOTAL: 0.3 mg/dL (ref 0.2–1.2)
BUN: 16 mg/dL (ref 6–23)
CO2: 31 meq/L (ref 19–32)
Calcium: 9.2 mg/dL (ref 8.4–10.5)
Chloride: 102 mEq/L (ref 96–112)
Creatinine, Ser: 0.89 mg/dL (ref 0.40–1.20)
GFR: 65.46 mL/min (ref >60.00)
GLUCOSE: 95 mg/dL (ref 70–99)
Potassium: 4.2 mEq/L (ref 3.5–5.1)
SODIUM: 141 meq/L (ref 135–145)
Total Protein: 7.1 g/dL (ref 6.0–8.3)

## 2014-11-20 LAB — LIPID PANEL
Cholesterol: 219 mg/dL — ABNORMAL HIGH (ref 0–200)
HDL: 57.7 mg/dL (ref >39.00)
NONHDL: 160.98
Total CHOL/HDL Ratio: 4
Triglycerides: 245 mg/dL — ABNORMAL HIGH (ref 0.0–149.0)
VLDL: 49 mg/dL — ABNORMAL HIGH (ref 0.0–40.0)

## 2014-11-20 LAB — LDL CHOLESTEROL, DIRECT: LDL DIRECT: 123 mg/dL

## 2014-11-20 MED ORDER — ESOMEPRAZOLE MAGNESIUM 40 MG PO CPDR: 40.0000 mg | DELAYED_RELEASE_CAPSULE | Freq: Every day | ORAL | Status: DC

## 2014-11-20 MED ORDER — CELECOXIB 100 MG PO CAPS: 100.0000 mg | ORAL_CAPSULE | Freq: Every day | ORAL | Status: DC

## 2014-11-20 NOTE — Progress Notes (Signed)
   Subjective:    Patient ID: Kaylee Davis, female    DOB: 03/11/1941, 74 y.o.   MRN: [id number]  HPI The patient is a 74 YO female coming in for GERD and arthritis. She used to be on nexium which worked well. She had to change to protonix due to her insurance formulary and has had recurrence of symptoms. Started buying nexium over the counter to help but is not able to do that long term. Feeling tightness in her throat and pain in her stomach with eating.  Her arthritis was well controlled with celebrex but again had to switch to meloxicam due to insurance formulary. She is not getting pain relief. She has also tried ibuprofen and naproxen over the counter without relief. She has even tried adding tylenol without relief. Pain 4-6/10 most of the time.   Review of Systems  Constitutional: Negative for fever, activity change, appetite change and unexpected weight change.  HENT: Negative.   Respiratory: Negative for cough, chest tightness and shortness of breath.   Cardiovascular: Negative for chest pain, palpitations and leg swelling.  Gastrointestinal: Positive for abdominal pain. Negative for diarrhea, constipation, blood in stool and abdominal distention.  Musculoskeletal: Positive for arthralgias. Negative for gait problem.  Skin: Negative.   Neurological: Negative.   Psychiatric/Behavioral: Negative.       Objective:   Physical Exam  Constitutional: She is oriented to person, place, and time. She appears well-developed and well-nourished.  HENT:  Head: Normocephalic and atraumatic.  Eyes: EOM are normal.  Neck: Normal range of motion.  Cardiovascular: Normal rate and regular rhythm.   Pulmonary/Chest: Effort normal and breath sounds normal. No respiratory distress. She has no wheezes. She has no rales.  Abdominal: Soft. Bowel sounds are normal. She exhibits no distension. There is no tenderness. There is no rebound.  Neurological: She is alert and oriented to person, place, and  time. Coordination normal.  Skin: Skin is warm and dry.   Filed Vitals:   11/20/14 1426  BP: 140/80  Pulse: 73  Temp: 97.9 F (36.6 C)  TempSrc: Oral  Resp: 12  Height:  (1.676 m)  Weight: 171 lb (77.565 kg)  SpO2: 97%      Assessment & Plan:

## 2014-11-20 NOTE — Assessment & Plan Note (Signed)
Rx for nexium sent in. Has tried and failed protonix and tried omeprazole over the counter in the past without relief.

## 2014-11-20 NOTE — Assessment & Plan Note (Signed)
Rx for celebrex sent in today. Has tried and failed meloxicam, ibuprofen, naproxen.

## 2014-11-20 NOTE — Progress Notes (Signed)
Pre visit review using our clinic review tool, if applicable. No additional management support is needed unless otherwise documented below in the visit note. 

## 2014-11-20 NOTE — Assessment & Plan Note (Signed)
Checking CMP as none in about 1 year and on lasix and amlodipine. BP at goal. Adjust as indicated.

## 2014-11-20 NOTE — Patient Instructions (Signed)
We will check some blood work today just to make sure nothing has changed before we clear you for surgery. If everything is fine I would be happy to fill out papers for you.   We have sent in the celebrex and the nexium.

## 2014-12-21 ENCOUNTER — Ambulatory Visit: Payer: Medicare Other | Admitting: Family Medicine

## 2015-01-15 ENCOUNTER — Ambulatory Visit (INDEPENDENT_AMBULATORY_CARE_PROVIDER_SITE_OTHER): Payer: Medicare Other

## 2015-01-15 DIAGNOSIS — Z23 Encounter for immunization: Secondary | ICD-10-CM | POA: Diagnosis not present

## 2015-03-24 ENCOUNTER — Telehealth: Payer: Self-pay | Admitting: Internal Medicine

## 2015-03-24 NOTE — Telephone Encounter (Signed)
No, does not need separate visit.

## 2015-03-24 NOTE — Telephone Encounter (Signed)
Pt state she has an appt for knee replacement coming up on 06/2015. Pt is unsure if she need to come in for the surgery clearance again since she had an appt with Dr. Okey Duprerawford on 11/2014? Please advise, does pt need to contact that office to get them to send the surgery clearance paper work for Dr. Okey Duprerawford to sign as well? Please advise  Phone # [phone]---ok to leave detail massage on vm

## 2015-03-29 NOTE — Telephone Encounter (Signed)
Pt aware, no need to make another appt per dr. Okey Duprerawford, just get them to fax over the medical clearance paper work and we will fill it out.

## 2015-03-30 ENCOUNTER — Other Ambulatory Visit: Payer: Self-pay | Admitting: Internal Medicine

## 2015-05-14 ENCOUNTER — Ambulatory Visit (INDEPENDENT_AMBULATORY_CARE_PROVIDER_SITE_OTHER): Payer: Medicare Other | Admitting: Obstetrics and Gynecology

## 2015-05-14 ENCOUNTER — Encounter: Payer: Self-pay | Admitting: Obstetrics and Gynecology

## 2015-05-14 VITALS — BP 124/76 | HR 70 | Resp 20 | Ht 66.0 in | Wt 173.2 lb

## 2015-05-14 DIAGNOSIS — Z01419 Encounter for gynecological examination (general) (routine) without abnormal findings: Secondary | ICD-10-CM

## 2015-05-14 NOTE — Progress Notes (Signed)
75 y.o. G51P2002 Married Caucasian female here for annual exam.    Stopped ERT one year ago.  Has hot flashes, especially at hs.   Having a right knee replacement in March.   PCP:  Hillard Danker, MD - [facility].  Patient's last menstrual period was 03/21/1983.          Sexually active: No. female The current method of family planning is status post hysterectomy/BSO.    Exercising: No.  Does weights for arms--to have Rt.knee replacement 05-28-15. Smoker:  no  Health Maintenance: Pap:  06/2001 Neg History of abnormal Pap:  no MMG:  05-11-14 Density Cat.A/Neg/BiRads1:[facility] Colonoscopy:  NEVER.   BMD:   2013 Result:Normal:[facility] TDaP:  12-11-08 Screening Labs:  Hb today: PCP, Urine today: PCP   reports that she has never smoked. She has never used smokeless tobacco. She reports that she drinks alcohol. She reports that she does not use illicit drugs.  Past Medical History  Diagnosis Date  . Hypertension   . TMJ (temporomandibular joint disorder)     PAST HX GRINDING TEETH --BUT NO LONGER A PROBLEM  . Dyspepsia   . Migraine headache     much reduced frequency  . History of PSVT (paroxysmal supraventricular tachycardia)     can control by doing a valsalva. Triggered by elevated heart rate  . 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 GERD (gastroesophageal reflux disease)   . Arthritis     OA BOTH KNEES; OCCAS PAIN LEFT SHOULDER - HX OF ROTATOR CUFF PROBLEM  . Tachycardia     Episodic    Past Surgical History  Procedure Laterality Date  . Arthroplasty      left Knee   . Tonsillectomy    . 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 Laparoscopic appendectomy  02/2000  . Knee arthroscopy Left 10/2005  . Wisdom tooth extraction  2008  . Abdominal hysterectomy  1985    TAH/BSO--for ?fibroids     Current Outpatient Prescriptions  Medication Sig Dispense Refill  . amLODipine (NORVASC) 10 MG tablet TAKE 1 BY MOUTH EVERY MORNING 90 tablet 0  . Esomeprazole Magnesium (NEXIUM PO) Take 22.3 mg by mouth every morning.    . furosemide (LASIX) 40 MG tablet TAKE 1 BY MOUTH DAILY 90 tablet 0  . meloxicam (MOBIC) 15 MG tablet Take 15 mg by mouth daily.     No current facility-administered medications for this visit.    Family History  Problem Relation Age of Onset  . Cancer Neg Hx     Breast/Colon  . Diabetes Neg Hx   . Heart disease Neg Hx     CAD/MI    ROS:  Pertinent items are noted in HPI.  Otherwise, a comprehensive ROS was negative.  Exam:   BP 124/76 mmHg  Pulse 70  Resp 20  Ht  (1.676 m)  Wt 173 lb 3.2 oz (78.563 kg)  BMI 27.97 kg/m2  LMP 03/21/1983    General appearance: alert, cooperative and appears stated age Head: Normocephalic, without obvious abnormality, atraumatic Neck: no adenopathy, supple, symmetrical, trachea midline and thyroid normal to inspection and palpation Lungs: clear to auscultation bilaterally Breasts: normal appearance, no masses or tenderness, Inspection negative, No nipple retraction or dimpling, No nipple discharge or bleeding, No axillary or supraclavicular adenopathy  Heart: regular rate and rhythm Abdomen: soft, non-tender; bowel sounds normal; no masses,  no organomegaly Extremities: extremities normal, atraumatic, no cyanosis or edema Skin: Skin color, texture, turgor normal. No rashes or lesions Lymph nodes: Cervical, supraclavicular, and axillary nodes normal. No abnormal inguinal nodes palpated Neurologic: Grossly normal  Pelvic: External genitalia:  no lesions              Urethra:  normal appearing urethra with no masses, tenderness or lesions              Bartholins and Skenes: normal                 Vagina: normal appearing vagina with normal color and discharge, no lesions              Cervix: absent              Pap  taken: No. Bimanual Exam:  Uterus:  uterus absent              Adnexa: no mass, fullness, tenderness              Rectovaginal: Yes.  .  Confirms.              Anus:  normal sphincter tone, no lesions  Chaperone was present for exam.  Assessment:   Well woman visit with normal exam. Status post TAH/BSO. Hot flashes off ERT.   Plan: Yearly mammogram recommended after age 29.  Recommended yearly.  Recommended self breast exam.  Pap and HR HPV as above. Discussed Calcium, Vitamin D, regular exercise program including cardiovascular and weight bearing exercise. Labs performed.  No..     Refills given on medications.  No..    Herbal remedies discussed for tx of hot flashes. Written information given.  Use natural fabrics for clothing and bed. Discussed colonoscopy and IFOB testing.  Patient declines these at this time.  Follow up annually and prn.     After visit summary provided.

## 2015-05-14 NOTE — Patient Instructions (Signed)
EXERCISE AND DIET:  We recommended that you start or continue a regular exercise program for good health. Regular exercise means any activity that makes your heart beat faster and makes you sweat.  We recommend exercising at least 30 minutes per day at least 3 days a week, preferably 4 or 5.  We also recommend a diet low in fat and sugar.  Inactivity, poor dietary choices and obesity can cause diabetes, heart attack, stroke, and kidney damage, among others.    ALCOHOL AND SMOKING:  Women should limit their alcohol intake to no more than 7 drinks/beers/glasses of wine (combined, not each!) per week. Moderation of alcohol intake to this level decreases your risk of breast cancer and liver damage. And of course, no recreational drugs are part of a healthy lifestyle.  And absolutely no smoking or even second hand smoke. Most people know smoking can cause heart and lung diseases, but did you know it also contributes to weakening of your bones? Aging of your skin?  Yellowing of your teeth and nails?  CALCIUM AND VITAMIN D:  Adequate intake of calcium and Vitamin D are recommended.  The recommendations for exact amounts of these supplements seem to change often, but generally speaking 600 mg of calcium (either carbonate or citrate) and 800 units of Vitamin D per day seems prudent. Certain women may benefit from higher intake of Vitamin D.  If you are among these women, your doctor will have told you during your visit.    PAP SMEARS:  Pap smears, to check for cervical cancer or precancers,  have traditionally been done yearly, although recent scientific advances have shown that most women can have pap smears less often.  However, every woman still should have a physical exam from her gynecologist every year. It will include a breast check, inspection of the vulva and vagina to check for abnormal growths or skin changes, a visual exam of the cervix, and then an exam to evaluate the size and shape of the uterus and  ovaries.  And after 77 years of age, a rectal exam is indicated to check for rectal cancers. We will also provide age appropriate advice regarding health maintenance, like when you should have certain vaccines, screening for sexually transmitted diseases, bone density testing, colonoscopy, mammograms, etc.   MAMMOGRAMS:  All women over 40 years old should have a yearly mammogram. Many facilities now offer a "3D" mammogram, which may cost around $50 extra out of pocket. If possible,  we recommend you accept the option to have the 3D mammogram performed.  It both reduces the number of women who will be called back for extra views which then turn out to be normal, and it is better than the routine mammogram at detecting truly abnormal areas.    COLONOSCOPY:  Colonoscopy to screen for colon cancer is recommended for all women at age 50.  We know, you hate the idea of the prep.  We agree, BUT, having colon cancer and not knowing it is worse!!  Colon cancer so often starts as a polyp that can be seen and removed at colonscopy, which can quite literally save your life!  And if your first colonoscopy is normal and you have no family history of colon cancer, most women don't have to have it again for 10 years.  Once every ten years, you can do something that may end up saving your life, right?  We will be happy to help you get it scheduled when you are ready.    Be sure to check your insurance coverage so you understand how much it will cost.  It may be covered as a preventative service at no cost, but you should check your particular policy.     Menopause and Herbal Products WHAT IS MENOPAUSE? Menopause is the normal time of life when menstrual periods decrease in frequency and eventually stop completely. This process can take several years for some women. Menopause is complete when you have had an absence of menstruation for a full year since your last menstrual period. It usually occurs between the ages of 48 and  55. It is not common for menopause to begin before the age of 40. During menopause, your body stops producing the female hormones estrogen and progesterone. Common symptoms associated with this loss of hormones (vasomotor symptoms) are:  Hot flashes.  Hot flushes.  Night sweats. Other common symptoms and complications of menopause include:  Decrease in sex drive.  Vaginal dryness and thinning of the walls of the vagina. This can make sex painful.  Dryness of the skin and development of wrinkles.  Headaches.  Tiredness.  Irritability.  Memory problems.  Weight gain.  Bladder infections.  Hair growth on the face and chest.  Inability to reproduce offspring (infertility).  Loss of density in the bones (osteoporosis) increasing your risk for breaks (fractures).  Depression.  Hardening and narrowing of the arteries (atherosclerosis). This increases your risk of heart attack and stroke. WHAT TREATMENT OPTIONS ARE AVAILABLE? There are many treatment choices for menopause symptoms. The most common treatment is hormone replacement therapy. Many alternative therapies for menopause are emerging, including the use of herbal products. These supplements can be found in the form of herbs, teas, oils, tinctures, and pills. Common herbal supplements for menopause are made from plants that contain phytoestrogens. Phytoestrogens are compounds that occur naturally in plants and plant products. They act like estrogen in the body. Foods and herbs that contain phytoestrogens include:  Soy.  Flax seeds.  Red clover.  Ginseng. WHAT MENOPAUSE SYMPTOMS MAY BE HELPED IF I USE HERBAL PRODUCTS?  Vasomotor symptoms. These may be helped by:  Soy. Some studies show that soy may have a moderate benefit for hot flashes.  Black cohosh. There is limited evidence indicating this may be beneficial for hot flashes.  Symptoms that are related to heart and blood vessel disease. These may be helped by  soy. Studies have shown that soy can help to lower cholesterol.  Depression. This may be helped by:  St. John's wort. There is limited evidence that shows this may help mild to moderate depression.  Black cohosh. There is evidence that this may help depression and mood swings.  Osteoporosis. Soy may help to decrease bone loss that is associated with menopause and may prevent osteoporosis. Limited evidence indicates that red clover may offer some bone loss protection as well. Other herbal products that are commonly used during menopause lack enough evidence to support their use as a replacement for conventional menopause therapies. These products include evening primrose, ginseng, and red clover. WHAT ARE THE CASES WHEN HERBAL PRODUCTS SHOULD NOT BE USED DURING MENOPAUSE? Do not use herbal products during menopause without your health care provider's approval if:  You are taking medicine.  You have a preexisting liver condition. ARE THERE ANY RISKS IN MY TAKING HERBAL PRODUCTS DURING MENOPAUSE? If you choose to use herbal products to help with symptoms of menopause, keep in mind that:  Different supplements have different and unmeasured amounts of herbal ingredients.    Herbal products are not regulated the same way that medicines are.  Concentrations of herbs may vary depending on the way they are prepared. For example, the concentration may be different in a pill, tea, oil, and tincture.  Little is known about the risks of using herbal products, particularly the risks of long-term use.  Some herbal supplements can be harmful when combined with certain medicines. Most commonly reported side effects of herbal products are mild. However, if used improperly, many herbal supplements can cause serious problems. Talk to your health care provider before starting any herbal product. If problems develop, stop taking the supplement and let your health care provider know.   This information is not  intended to replace advice given to you by your health care provider. Make sure you discuss any questions you have with your health care provider.   Document Released: 08/23/2007 Document Revised: 03/27/2014 Document Reviewed: 08/19/2013 Elsevier Interactive Patient Education 2016 [employer].  

## 2015-05-18 ENCOUNTER — Ambulatory Visit: Payer: Self-pay | Admitting: Orthopedic Surgery

## 2015-05-18 NOTE — Progress Notes (Signed)
Preoperative surgical orders have been place into the Epic hospital system for KARITA DRALLE on 05/18/2015, 10:29 AM  by Patrica Duel for surgery on 05-28-2015.  Preop Total Knee orders including Experal, IV Tylenol, and IV Decadron as long as there are no contraindications to the above medications. Avel Peace, PA-C

## 2015-05-19 ENCOUNTER — Encounter (HOSPITAL_COMMUNITY)
Admission: RE | Admit: 2015-05-19 | Discharge: 2015-05-19 | Disposition: A | Discharge status: Home / Self Care | Payer: Medicare Other | Source: Ambulatory Visit | Attending: Orthopedic Surgery | Admitting: Orthopedic Surgery

## 2015-05-19 ENCOUNTER — Encounter (HOSPITAL_COMMUNITY): Payer: Self-pay

## 2015-05-19 ENCOUNTER — Other Ambulatory Visit: Payer: Self-pay

## 2015-05-19 DIAGNOSIS — Z01812 Encounter for preprocedural laboratory examination: Secondary | ICD-10-CM | POA: Insufficient documentation

## 2015-05-19 DIAGNOSIS — Z0183 Encounter for blood typing: Secondary | ICD-10-CM | POA: Diagnosis not present

## 2015-05-19 DIAGNOSIS — Z01818 Encounter for other preprocedural examination: Secondary | ICD-10-CM | POA: Insufficient documentation

## 2015-05-19 DIAGNOSIS — I10 Essential (primary) hypertension: Secondary | ICD-10-CM | POA: Diagnosis not present

## 2015-05-19 DIAGNOSIS — M1711 Unilateral primary osteoarthritis, right knee: Secondary | ICD-10-CM | POA: Diagnosis not present

## 2015-05-19 HISTORY — DX: Personal history of urinary (tract) infections: Z87.440

## 2015-05-19 HISTORY — DX: Other abnormalities of gait and mobility: R26.89

## 2015-05-19 HISTORY — DX: Presence of spectacles and contact lenses: Z97.3

## 2015-05-19 HISTORY — DX: Personal history of other medical treatment: Z92.89

## 2015-05-19 HISTORY — DX: Paresthesia of skin: R20.2

## 2015-05-19 LAB — COMPREHENSIVE METABOLIC PANEL
ALK PHOS: 88 U/L (ref 38–126)
ALT: 13 U/L — AB (ref 14–54)
ANION GAP: 8 (ref 5–15)
AST: 23 U/L (ref 15–41)
Albumin: 4.3 g/dL (ref 3.5–5.0)
BUN: 15 mg/dL (ref 6–20)
CALCIUM: 9.6 mg/dL (ref 8.9–10.3)
CO2: 29 mmol/L (ref 22–32)
CREATININE: 0.69 mg/dL (ref 0.44–1.00)
Chloride: 104 mmol/L (ref 101–111)
Glucose, Bld: 85 mg/dL (ref 65–99)
Potassium: 4 mmol/L (ref 3.5–5.1)
Sodium: 141 mmol/L (ref 135–145)
TOTAL PROTEIN: 7.6 g/dL (ref 6.5–8.1)
Total Bilirubin: 0.8 mg/dL (ref 0.3–1.2)

## 2015-05-19 LAB — PROTIME-INR
INR: 0.94 (ref 0.00–1.49)
PROTHROMBIN TIME: 12.8 s (ref 11.6–15.2)

## 2015-05-19 LAB — CBC
HEMATOCRIT: 44.5 % (ref 36.0–46.0)
Hemoglobin: 14.3 g/dL (ref 12.0–15.0)
MCH: 29.9 pg (ref 26.0–34.0)
MCHC: 32.1 g/dL (ref 30.0–36.0)
MCV: 93.1 fL (ref 78.0–100.0)
PLATELETS: 301 10*3/uL (ref 150–400)
RBC: 4.78 MIL/uL (ref 3.87–5.11)
RDW: 12.9 % (ref 11.5–15.5)
WBC: 6.6 10*3/uL (ref 4.0–10.5)

## 2015-05-19 LAB — URINALYSIS, ROUTINE W REFLEX MICROSCOPIC
Bilirubin Urine: NEGATIVE
GLUCOSE, UA: NEGATIVE mg/dL
HGB URINE DIPSTICK: NEGATIVE
Ketones, ur: NEGATIVE mg/dL
LEUKOCYTES UA: NEGATIVE
Nitrite: NEGATIVE
PH: 5 (ref 5.0–8.0)
PROTEIN: NEGATIVE mg/dL
SPECIFIC GRAVITY, URINE: 1.007 (ref 1.005–1.030)

## 2015-05-19 LAB — SURGICAL PCR SCREEN
MRSA, PCR: NEGATIVE
Staphylococcus aureus: NEGATIVE

## 2015-05-19 LAB — APTT: APTT: 29 s (ref 24–37)

## 2015-05-19 NOTE — Patient Instructions (Signed)
Kaylee Davis  05/19/2015   Your procedure is scheduled on: Friday May 28, 2015  Report to [facility] Main  Entrance take [place]  elevators to 3rd floor to  Short Stay Center at 7:15 AM.  Call this number if you have problems the morning of surgery [phone]   Remember: ONLY 1 PERSON MAY GO WITH YOU TO SHORT STAY TO GET  READY MORNING OF YOUR SURGERY.  Do not eat food or drink liquids :After Midnight.     Take these medicines the morning of surgery with A SIP OF WATER: Amlodipine (Norvasc); Nexium                                You may not have any metal on your body including hair pins and              piercings  Do not wear jewelry, make-up, lotions, powders or perfumes, deodorant             Do not wear nail polish.  Do not shave  48 hours prior to surgery.              Do not bring valuables to the hospital. [facility] IS NOT             RESPONSIBLE   FOR VALUABLES.  Contacts, dentures or bridgework may not be worn into surgery.  Leave suitcase in the car. After surgery it may be brought to your room.                Please read over the following fact sheets you were given:MRSA INFORMATION SHEET; INCENTIVE SPIROMETER; BLOOD TRANSFUSION INFORMATION SHEET  _____________________________________________________________________             [facility] - Preparing for Surgery Before surgery, you can play an important role.  Because skin is not sterile, your skin needs to be as free of germs as possible.  You can reduce the number of germs on your skin by washing with CHG (chlorahexidine gluconate) soap before surgery.  CHG is an antiseptic cleaner which kills germs and bonds with the skin to continue killing germs even after washing. Please DO NOT use if you have an allergy to CHG or antibacterial soaps.  If your skin becomes reddened/irritated stop using the CHG and inform your nurse when you arrive at Short Stay. Do not shave (including legs and  underarms) for at least 48 hours prior to the first CHG shower.  You may shave your face/neck. Please follow these instructions carefully:  1.  Shower with CHG Soap the night before surgery and the  morning of Surgery.  2.  If you choose to wash your hair, wash your hair first as usual with your  normal  shampoo.  3.  After you shampoo, rinse your hair and body thoroughly to remove the  shampoo.                           4.  Use CHG as you would any other liquid soap.  You can apply chg directly  to the skin and wash                       Gently with a scrungie or clean washcloth.  5.  Apply the CHG Soap to your body ONLY FROM THE NECK DOWN.   Do not use on face/ open                           Wound or open sores. Avoid contact with eyes, ears mouth and genitals (private parts).                       Wash face,  Genitals (private parts) with your normal soap.             6.  Wash thoroughly, paying special attention to the area where your surgery  will be performed.  7.  Thoroughly rinse your body with warm water from the neck down.  8.  DO NOT shower/wash with your normal soap after using and rinsing off  the CHG Soap.                9.  Pat yourself dry with a clean towel.            10.  Wear clean pajamas.            11.  Place clean sheets on your bed the night of your first shower and do not  sleep with pets. Day of Surgery : Do not apply any lotions/deodorants the morning of surgery.  Please wear clean clothes to the hospital/surgery center.  FAILURE TO FOLLOW THESE INSTRUCTIONS MAY RESULT IN THE CANCELLATION OF YOUR SURGERY PATIENT SIGNATURE_________________________________  NURSE SIGNATURE__________________________________  ________________________________________________________________________   Kaylee Davis  An incentive spirometer is a tool that can help keep your lungs clear and active. This tool measures how well you are filling your lungs with each breath. Taking  long deep breaths may help reverse or decrease the chance of developing breathing (pulmonary) problems (especially infection) following:  A long period of time when you are unable to move or be active. BEFORE THE PROCEDURE   If the spirometer includes an indicator to show your best effort, your nurse or respiratory therapist will set it to a desired goal.  If possible, sit up straight or lean slightly forward. Try not to slouch.  Hold the incentive spirometer in an upright position. INSTRUCTIONS FOR USE   Sit on the edge of your bed if possible, or sit up as far as you can in bed or on a chair.  Hold the incentive spirometer in an upright position.  Breathe out normally.  Place the mouthpiece in your mouth and seal your lips tightly around it.  Breathe in slowly and as deeply as possible, raising the piston or the ball toward the top of the column.  Hold your breath for 3-5 seconds or for as long as possible. Allow the piston or ball to fall to the bottom of the column.  Remove the mouthpiece from your mouth and breathe out normally.  Rest for a few seconds and repeat Steps 1 through 7 at least 10 times every 1-2 hours when you are awake. Take your time and take a few normal breaths between deep breaths.  The spirometer may include an indicator to show your best effort. Use the indicator as a goal to work toward during each repetition.  After each set of 10 deep breaths, practice coughing to be sure your lungs are clear. If you have an incision (the cut made at the time of surgery), support your incision when coughing by placing a pillow or  rolled up towels firmly against it. Once you are able to get out of bed, walk around indoors and cough well. You may stop using the incentive spirometer when instructed by your caregiver.  RISKS AND COMPLICATIONS  Take your time so you do not get dizzy or light-headed.  If you are in pain, you may need to take or ask for pain medication before  doing incentive spirometry. It is harder to take a deep breath if you are having pain. AFTER USE  Rest and breathe slowly and easily.  It can be helpful to keep track of a log of your progress. Your caregiver can provide you with a simple table to help with this. If you are using the spirometer at home, follow these instructions: [place] IF:   You are having difficultly using the spirometer.  You have trouble using the spirometer as often as instructed.  Your pain medication is not giving enough relief while using the spirometer.  You develop fever of 100.5 F (38.1 C) or higher. SEEK IMMEDIATE MEDICAL CARE IF:   You cough up bloody sputum that had not been present before.  You develop fever of 102 F (38.9 C) or greater.  You develop worsening pain at or near the incision site. MAKE SURE YOU:   Understand these instructions.  Will watch your condition.  Will get help right away if you are not doing well or get worse. Document Released: 07/17/2006 Document Revised: 05/29/2011 Document Reviewed: 09/17/2006 ExitCare Patient Information 2014 ExitCare, [place].   ________________________________________________________________________  WHAT IS A BLOOD TRANSFUSION? Blood Transfusion Information  A transfusion is the replacement of blood or some of its parts. Blood is made up of multiple cells which provide different functions.  Red blood cells carry oxygen and are used for blood loss replacement.  White blood cells fight against infection.  Platelets control bleeding.  Plasma helps clot blood.  Other blood products are available for specialized needs, such as hemophilia or other clotting disorders. BEFORE THE TRANSFUSION  Who gives blood for transfusions?   Healthy volunteers who are fully evaluated to make sure their blood is safe. This is blood bank blood. Transfusion therapy is the safest it has ever been in the practice of medicine. Before blood is taken  from a donor, a complete history is taken to make sure that person has no history of diseases nor engages in risky social behavior (examples are intravenous drug use or sexual activity with multiple partners). The donor's travel history is screened to minimize risk of transmitting infections, such as malaria. The donated blood is tested for signs of infectious diseases, such as HIV and hepatitis. The blood is then tested to be sure it is compatible with you in order to minimize the chance of a transfusion reaction. If you or a relative donates blood, this is often done in anticipation of surgery and is not appropriate for emergency situations. It takes many days to process the donated blood. RISKS AND COMPLICATIONS Although transfusion therapy is very safe and saves many lives, the main dangers of transfusion include:   Getting an infectious disease.  Developing a transfusion reaction. This is an allergic reaction to something in the blood you were given. Every precaution is taken to prevent this. The decision to have a blood transfusion has been considered carefully by your caregiver before blood is given. Blood is not given unless the benefits outweigh the risks. AFTER THE TRANSFUSION  Right after receiving a blood transfusion, you will usually  feel much better and more energetic. This is especially true if your red blood cells have gotten low (anemic). The transfusion raises the level of the red blood cells which carry oxygen, and this usually causes an energy increase.  The nurse administering the transfusion will monitor you carefully for complications. HOME CARE INSTRUCTIONS  No special instructions are needed after a transfusion. You may find your energy is better. Speak with your caregiver about any limitations on activity for underlying diseases you may have. SEEK MEDICAL CARE IF:   Your condition is not improving after your transfusion.  You develop redness or irritation at the  intravenous (IV) site. SEEK IMMEDIATE MEDICAL CARE IF:  Any of the following symptoms occur over the next 12 hours:  Shaking chills.  You have a temperature by mouth above 102 F (38.9 C), not controlled by medicine.  Chest, back, or muscle pain.  People around you feel you are not acting correctly or are confused.  Shortness of breath or difficulty breathing.  Dizziness and fainting.  You get a rash or develop hives.  You have a decrease in urine output.  Your urine turns a dark color or changes to pink, red, or brown. Any of the following symptoms occur over the next 10 days:  You have a temperature by mouth above 102 F (38.9 C), not controlled by medicine.  Shortness of breath.  Weakness after normal activity.  The white part of the eye turns yellow (jaundice).  You have a decrease in the amount of urine or are urinating less often.  Your urine turns a dark color or changes to pink, red, or brown. Document Released: 03/03/2000 Document Revised: 05/29/2011 Document Reviewed: 10/21/2007 [facility] Patient Information 2014 [place], [place].  _______________________________________________________________________

## 2015-05-20 ENCOUNTER — Ambulatory Visit (INDEPENDENT_AMBULATORY_CARE_PROVIDER_SITE_OTHER): Payer: Medicare Other | Admitting: Internal Medicine

## 2015-05-20 ENCOUNTER — Encounter: Payer: Self-pay | Admitting: Internal Medicine

## 2015-05-20 ENCOUNTER — Telehealth: Payer: Self-pay

## 2015-05-20 VITALS — BP 128/80 | HR 97 | Temp 97.9°F | Resp 20 | Wt 171.0 lb

## 2015-05-20 DIAGNOSIS — I10 Essential (primary) hypertension: Secondary | ICD-10-CM

## 2015-05-20 DIAGNOSIS — M170 Bilateral primary osteoarthritis of knee: Secondary | ICD-10-CM

## 2015-05-20 MED ORDER — ESOMEPRAZOLE MAGNESIUM 20 MG PO CPDR: 20.0000 mg | DELAYED_RELEASE_CAPSULE | Freq: Every morning | ORAL | Status: DC

## 2015-05-20 MED ORDER — MELOXICAM 15 MG PO TABS: 15.0000 mg | ORAL_TABLET | Freq: Every day | ORAL | Status: DC

## 2015-05-20 MED ORDER — AMLODIPINE BESYLATE 10 MG PO TABS: ORAL_TABLET | ORAL | Status: DC

## 2015-05-20 MED ORDER — FUROSEMIDE 40 MG PO TABS: ORAL_TABLET | ORAL | Status: DC

## 2015-05-20 NOTE — Patient Instructions (Addendum)
We will get the medicines sent in to the new pharmacy.  Good luck with the knee replacement next week.   We would recommend to take a stool softener like colace (docusate) daily while on pain medication as this can constipate you.

## 2015-05-20 NOTE — Assessment & Plan Note (Signed)
BP at goal on amlodipine and lasix. Recent CMP without indication for change.

## 2015-05-20 NOTE — Progress Notes (Signed)
   Subjective:    Patient ID: Kaylee Davis, female    DOB: February 01, 1941, 75 y.o.   MRN: [id number]  HPI The patient is a 75 YO female coming in for follow up on her blood pressure. Having knee replacement next week and she is glad to be getting that done. No headaches, chest pains. No side effects from her medicines. No high readings at home. Is not exercising much due to her knee hurting and affecting her balance.   Review of Systems  Constitutional: Negative for fever, activity change, appetite change and unexpected weight change.  Respiratory: Negative for cough, chest tightness and shortness of breath.   Cardiovascular: Negative for chest pain, palpitations and leg swelling.  Gastrointestinal: Negative for abdominal pain, diarrhea, constipation, blood in stool and abdominal distention.  Musculoskeletal: Positive for arthralgias. Negative for gait problem.  Skin: Negative.   Neurological: Negative.   Psychiatric/Behavioral: Negative.       Objective:   Physical Exam  Constitutional: She is oriented to person, place, and time. She appears well-developed and well-nourished.  HENT:  Head: Normocephalic and atraumatic.  Eyes: EOM are normal.  Neck: Normal range of motion.  Cardiovascular: Normal rate and regular rhythm.   Pulmonary/Chest: Effort normal and breath sounds normal. No respiratory distress. She has no wheezes. She has no rales.  Abdominal: Soft. Bowel sounds are normal. She exhibits no distension. There is no tenderness. There is no rebound.  Neurological: She is alert and oriented to person, place, and time. Coordination normal.  Skin: Skin is warm and dry.   Filed Vitals:   05/20/15 0943  BP: 128/80  Pulse: 97  Temp: 97.9 F (36.6 C)  TempSrc: Oral  Resp: 20  Weight: 171 lb (77.565 kg)  SpO2: 97%      Assessment & Plan:

## 2015-05-20 NOTE — Telephone Encounter (Signed)
PA initiated via CoverMyMeds Key [id number]

## 2015-05-20 NOTE — Telephone Encounter (Signed)
PA Approved, pharmacy advised 

## 2015-05-20 NOTE — Assessment & Plan Note (Signed)
Reminded her that she may want to start using colace for prevention of constipation while on pain medications.

## 2015-05-20 NOTE — Progress Notes (Signed)
Pre visit review using our clinic review tool, if applicable. No additional management support is needed unless otherwise documented below in the visit note. 

## 2015-05-25 NOTE — H&P (Signed)
TOTAL KNEE ADMISSION H&P  Patient is being admitted for right total knee arthroplasty.  Subjective:  Chief Complaint:right knee pain.  HPI: Kaylee Davis, 75 y.o. female, has a history of pain and functional disability in the right knee due to arthritis and has failed non-surgical conservative treatments for greater than 12 weeks to includeNSAID's and/or analgesics, corticosteriod injections, flexibility and strengthening excercises and activity modification.  Onset of symptoms was gradual, starting 5 years ago with gradually worsening course since that time. The patient noted no past surgery on the right knee(s).  Patient currently rates pain in the right knee(s) at 7 out of 10 with activity. Patient has night pain, worsening of pain with activity and weight bearing, pain that interferes with activities of daily living, pain with passive range of motion, crepitus and joint swelling.  Patient has evidence of periarticular osteophytes and joint space narrowing by imaging studies.  There is no active infection.  Patient Active Problem List   Diagnosis Date Noted  . Dysuria 09/01/2014  . Routine health maintenance 02/03/2011  . Osteoarthritis 12/13/2009  . Essential hypertension 02/26/2007  . PSVT 02/26/2007  . GERD (gastroesophageal reflux disease) 02/26/2007   Past Medical History  Diagnosis Date  . Hypertension   . TMJ (temporomandibular joint disorder)     PAST HX GRINDING TEETH --BUT NO LONGER A PROBLEM  . Dyspepsia   . Migraine headache     much reduced frequency  . History of PSVT (paroxysmal supraventricular tachycardia)     can control by doing a valsalva. Triggered by elevated heart rate  . 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 GERD (gastroesophageal reflux disease)   . Arthritis     OA BOTH KNEES; OCCAS PAIN LEFT SHOULDER - HX OF ROTATOR CUFF PROBLEM  . Tachycardia     Episodic  . Wears glasses   . Tingling     left foot    . Imbalance   . History of frequent urinary tract infections   . History of blood transfusion     1990's    Past Surgical History  Procedure Laterality Date  . Arthroplasty      left Knee   . Tonsillectomy    . 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 Laparoscopic appendectomy  02/2000  . Knee arthroscopy Left 10/2005  . Wisdom tooth extraction  2008  . Abdominal hysterectomy  1985    TAH/BSO--for ?fibroids  . Appendectomy    . Cyst removed       from mouth      Current outpatient prescriptions:  .  amLODipine (NORVASC) 10 MG tablet, TAKE 1 BY MOUTH EVERY MORNING, Disp: 90 tablet, Rfl: 3 .  esomeprazole (NEXIUM) 20 MG capsule, Take 1 capsule (20 mg total) by mouth every morning., Disp: 90 capsule, Rfl: 3 .  furosemide (LASIX) 40 MG tablet, TAKE 1 BY MOUTH DAILY, Disp: 90 tablet, Rfl: 3 .  meloxicam (MOBIC) 15 MG tablet, Take 1 tablet (15 mg total) by mouth daily., Disp: 90 tablet, Rfl: 3  Allergies  Allergen Reactions  . Other     PT STATES THE SULFA AND PENICILLIN SHE TOOK MANY YEARS AGO BOTH HAD RED DYES--SO SHE WONDERED IF ALLERGIC TO RED DYES  . Penicillins Hives    Has had penicillin since  and has been fine.    . Sulfonamide Derivatives Hives    Social History  Substance Use Topics  . Smoking status: Never Smoker   . Smokeless tobacco: Never Used  . Alcohol Use: No    Family History  Problem Relation Age of Onset  . Cancer Neg Hx     Breast/Colon  . Diabetes Neg Hx   . Heart disease Neg Hx     CAD/MI     Review of Systems  Constitutional: Negative.   HENT: Negative.   Eyes: Negative.   Respiratory: Positive for shortness of breath. Negative for cough, hemoptysis, sputum production and wheezing.        SOB with exertion  Cardiovascular: Negative.   Gastrointestinal: Negative.   Genitourinary: Negative.    Musculoskeletal: Positive for myalgias and joint pain. Negative for back pain, falls and neck pain.  Skin: Negative.   Neurological: Negative.   Psychiatric/Behavioral: Negative.     Objective:  Physical Exam  Constitutional: She is oriented to person, place, and time. She appears well-developed and well-nourished. No distress.  HENT:  Head: Normocephalic and atraumatic.  Right Ear: External ear normal.  Left Ear: External ear normal.  Nose: Nose normal.  Mouth/Throat: Oropharynx is clear and moist.  Eyes: Conjunctivae and EOM are normal.  Neck: Normal range of motion. Neck supple.  Cardiovascular: Normal rate, regular rhythm, normal heart sounds and intact distal pulses.   No murmur heard. Respiratory: Effort normal and breath sounds normal. No respiratory distress. She has no wheezes.  GI: Soft. Bowel sounds are normal. She exhibits no distension. There is no tenderness.  Musculoskeletal:       Right hip: Normal.       Left hip: Normal.  Her left knee looks great. There is no swelling. It ranged 0 to 120. No tenderness or instability. Right knee, marked crepitus on range of motion. Ranged about 5 to 120. She is tender in lateral greater than medial with no instability.  Neurological: She is alert and oriented to person, place, and time. She has normal strength and normal reflexes. No sensory deficit.  Skin: No rash noted. She is not diaphoretic. No erythema.  Psychiatric: She has a normal mood and affect. Her behavior is normal.    Vitals Weight: 166 lb Height: 66.5in Body Surface Area: 1.86 m Body Mass Index: 26.39 kg/m  BP: 142/76 (Sitting, Left Arm, Standard) HR: 76 bpm  Imaging Review Plain radiographs demonstrate severe degenerative joint disease of the right knee(s). The overall alignment ismild valgus. The bone quality appears to be good for age and reported activity level.  Assessment/Plan:  End stage primary osteoarthritis, right knee   The patient  history, physical examination, clinical judgment of the provider and imaging studies are consistent with end stage degenerative joint disease of the right knee(s) and total knee arthroplasty is deemed medically necessary. The treatment options including medical management, injection therapy arthroscopy and arthroplasty were discussed at length. The risks and benefits of total knee arthroplasty were presented and reviewed. The risks due to aseptic loosening, infection, stiffness, patella tracking problems, thromboembolic complications and other imponderables were discussed. The patient acknowledged the explanation, agreed to proceed with the plan and consent was signed. Patient is being admitted for inpatient treatment for surgery, pain control, PT, OT, prophylactic antibiotics, VTE prophylaxis, progressive ambulation and ADL's and discharge planning. The patient is planning to be discharged home with home health services (would like same therapist as with left TKA)  PCP: Dr. Lanora ManisElizabeth  Crawford  [facility] [place], [place]

## 2015-05-28 ENCOUNTER — Inpatient Hospital Stay (HOSPITAL_COMMUNITY): Payer: Medicare Other | Admitting: Certified Registered"

## 2015-05-28 ENCOUNTER — Encounter (HOSPITAL_COMMUNITY)
Admission: RE | Disposition: A | Discharge status: Home Health | Payer: Self-pay | Source: Ambulatory Visit | Attending: Orthopedic Surgery

## 2015-05-28 ENCOUNTER — Encounter (HOSPITAL_COMMUNITY): Payer: Self-pay

## 2015-05-28 ENCOUNTER — Inpatient Hospital Stay (HOSPITAL_COMMUNITY)
Admission: RE | Admit: 2015-05-28 | Discharge: 2015-05-30 | DRG: 470 | Disposition: A | Discharge status: Home Health | Payer: Medicare Other | Source: Ambulatory Visit | Attending: Orthopedic Surgery | Admitting: Orthopedic Surgery

## 2015-05-28 DIAGNOSIS — K219 Gastro-esophageal reflux disease without esophagitis: Secondary | ICD-10-CM | POA: Diagnosis present

## 2015-05-28 DIAGNOSIS — Z01812 Encounter for preprocedural laboratory examination: Secondary | ICD-10-CM | POA: Diagnosis not present

## 2015-05-28 DIAGNOSIS — I10 Essential (primary) hypertension: Secondary | ICD-10-CM | POA: Diagnosis present

## 2015-05-28 DIAGNOSIS — Z79899 Other long term (current) drug therapy: Secondary | ICD-10-CM | POA: Diagnosis not present

## 2015-05-28 DIAGNOSIS — M1710 Unilateral primary osteoarthritis, unspecified knee: Secondary | ICD-10-CM | POA: Diagnosis present

## 2015-05-28 DIAGNOSIS — M179 Osteoarthritis of knee, unspecified: Secondary | ICD-10-CM | POA: Diagnosis not present

## 2015-05-28 DIAGNOSIS — Z87440 Personal history of urinary (tract) infections: Secondary | ICD-10-CM

## 2015-05-28 DIAGNOSIS — Z96652 Presence of left artificial knee joint: Secondary | ICD-10-CM | POA: Diagnosis not present

## 2015-05-28 DIAGNOSIS — M1711 Unilateral primary osteoarthritis, right knee: Secondary | ICD-10-CM | POA: Diagnosis not present

## 2015-05-28 DIAGNOSIS — M25561 Pain in right knee: Secondary | ICD-10-CM | POA: Diagnosis present

## 2015-05-28 HISTORY — PX: TOTAL KNEE ARTHROPLASTY: SHX125

## 2015-05-28 LAB — TYPE AND SCREEN
ABO/RH(D): O POS
Antibody Screen: NEGATIVE

## 2015-05-28 SURGERY — ARTHROPLASTY, KNEE, TOTAL
Anesthesia: Spinal | Site: Knee | Laterality: Right

## 2015-05-28 MED ORDER — TRAMADOL HCL 50 MG PO TABS
50.0000 mg | ORAL_TABLET | Freq: Four times a day (QID) | ORAL | Status: DC | PRN
  Administered 2015-05-28 – 2015-05-29 (×2): 100 mg via ORAL
  Filled 2015-05-28 (×2): qty 2

## 2015-05-28 MED ORDER — FENTANYL CITRATE (PF) 100 MCG/2ML IJ SOLN
INTRAMUSCULAR | Status: AC
  Filled 2015-05-28: qty 2

## 2015-05-28 MED ORDER — ACETAMINOPHEN 500 MG PO TABS
1000.0000 mg | ORAL_TABLET | Freq: Four times a day (QID) | ORAL | Status: AC
  Administered 2015-05-28 – 2015-05-29 (×3): 1000 mg via ORAL
  Filled 2015-05-28 (×4): qty 2

## 2015-05-28 MED ORDER — STERILE WATER FOR IRRIGATION IR SOLN
Status: DC | PRN
  Administered 2015-05-28: 2000 mL

## 2015-05-28 MED ORDER — DEXAMETHASONE SODIUM PHOSPHATE 10 MG/ML IJ SOLN
10.0000 mg | Freq: Once | INTRAMUSCULAR | Status: AC
  Administered 2015-05-29: 10 mg via INTRAVENOUS
  Filled 2015-05-28: qty 1

## 2015-05-28 MED ORDER — PHENOL 1.4 % MT LIQD
1.0000 | OROMUCOSAL | Status: DC | PRN
  Filled 2015-05-28: qty 177

## 2015-05-28 MED ORDER — BUPIVACAINE LIPOSOME 1.3 % IJ SUSP
20.0000 mL | Freq: Once | INTRAMUSCULAR | Status: DC
  Filled 2015-05-28: qty 20

## 2015-05-28 MED ORDER — ACETAMINOPHEN 10 MG/ML IV SOLN
INTRAVENOUS | Status: AC
  Filled 2015-05-28: qty 100

## 2015-05-28 MED ORDER — METHOCARBAMOL 500 MG PO TABS
500.0000 mg | ORAL_TABLET | Freq: Four times a day (QID) | ORAL | Status: DC | PRN
  Administered 2015-05-28 – 2015-05-30 (×5): 500 mg via ORAL
  Filled 2015-05-28 (×5): qty 1

## 2015-05-28 MED ORDER — METOCLOPRAMIDE HCL 5 MG/ML IJ SOLN: 5.0000 mg | Freq: Three times a day (TID) | INTRAMUSCULAR | Status: DC | PRN

## 2015-05-28 MED ORDER — PROPOFOL 500 MG/50ML IV EMUL
INTRAVENOUS | Status: DC | PRN
  Administered 2015-05-28: 100 ug/kg/min via INTRAVENOUS

## 2015-05-28 MED ORDER — ACETAMINOPHEN 325 MG PO TABS: 650.0000 mg | ORAL_TABLET | Freq: Four times a day (QID) | ORAL | Status: DC | PRN

## 2015-05-28 MED ORDER — SODIUM CHLORIDE 0.9 % IV SOLN
INTRAVENOUS | Status: DC
Start: 2015-05-28 — End: 2015-05-28

## 2015-05-28 MED ORDER — ONDANSETRON HCL 4 MG/2ML IJ SOLN: 4.0000 mg | Freq: Four times a day (QID) | INTRAMUSCULAR | Status: DC | PRN

## 2015-05-28 MED ORDER — MIDAZOLAM HCL 5 MG/5ML IJ SOLN
INTRAMUSCULAR | Status: DC | PRN
  Administered 2015-05-28: 2 mg via INTRAVENOUS

## 2015-05-28 MED ORDER — FLEET ENEMA 7-19 GM/118ML RE ENEM: 1.0000 | ENEMA | Freq: Once | RECTAL | Status: DC | PRN

## 2015-05-28 MED ORDER — CHLORHEXIDINE GLUCONATE 4 % EX LIQD: 60.0000 mL | Freq: Once | CUTANEOUS | Status: DC

## 2015-05-28 MED ORDER — DIPHENHYDRAMINE HCL 12.5 MG/5ML PO ELIX: 12.5000 mg | ORAL_SOLUTION | ORAL | Status: DC | PRN

## 2015-05-28 MED ORDER — HYDROMORPHONE HCL 1 MG/ML IJ SOLN: 0.2500 mg | INTRAMUSCULAR | Status: DC | PRN

## 2015-05-28 MED ORDER — PHENYLEPHRINE 40 MCG/ML (10ML) SYRINGE FOR IV PUSH (FOR BLOOD PRESSURE SUPPORT)
PREFILLED_SYRINGE | INTRAVENOUS | Status: AC
  Filled 2015-05-28: qty 10

## 2015-05-28 MED ORDER — RIVAROXABAN 10 MG PO TABS
10.0000 mg | ORAL_TABLET | Freq: Every day | ORAL | Status: DC
  Administered 2015-05-29 – 2015-05-30 (×2): 10 mg via ORAL
  Filled 2015-05-28 (×3): qty 1

## 2015-05-28 MED ORDER — PROMETHAZINE HCL 25 MG/ML IJ SOLN: 6.2500 mg | INTRAMUSCULAR | Status: DC | PRN

## 2015-05-28 MED ORDER — PROPOFOL 10 MG/ML IV BOLUS
INTRAVENOUS | Status: AC
  Filled 2015-05-28: qty 40

## 2015-05-28 MED ORDER — DEXAMETHASONE SODIUM PHOSPHATE 10 MG/ML IJ SOLN
10.0000 mg | Freq: Once | INTRAMUSCULAR | Status: AC
  Administered 2015-05-28: 10 mg via INTRAVENOUS

## 2015-05-28 MED ORDER — BISACODYL 10 MG RE SUPP: 10.0000 mg | Freq: Every day | RECTAL | Status: DC | PRN

## 2015-05-28 MED ORDER — METHOCARBAMOL 1000 MG/10ML IJ SOLN
500.0000 mg | Freq: Four times a day (QID) | INTRAVENOUS | Status: DC | PRN
  Administered 2015-05-28: 500 mg via INTRAVENOUS
  Filled 2015-05-28 (×2): qty 5

## 2015-05-28 MED ORDER — BUPIVACAINE HCL (PF) 0.25 % IJ SOLN
INTRAMUSCULAR | Status: AC
  Filled 2015-05-28: qty 30

## 2015-05-28 MED ORDER — MENTHOL 3 MG MT LOZG: 1.0000 | LOZENGE | OROMUCOSAL | Status: DC | PRN

## 2015-05-28 MED ORDER — AMLODIPINE BESYLATE 10 MG PO TABS
10.0000 mg | ORAL_TABLET | Freq: Every day | ORAL | Status: DC
  Administered 2015-05-29 – 2015-05-30 (×2): 10 mg via ORAL
  Filled 2015-05-28 (×2): qty 1

## 2015-05-28 MED ORDER — ONDANSETRON HCL 4 MG/2ML IJ SOLN
INTRAMUSCULAR | Status: DC | PRN
Start: 2015-05-28 — End: 2015-05-28
  Administered 2015-05-28 (×2): 2 mg via INTRAVENOUS

## 2015-05-28 MED ORDER — BUPIVACAINE IN DEXTROSE 0.75-8.25 % IT SOLN
INTRATHECAL | Status: DC | PRN
  Administered 2015-05-28: 1.8 mL via INTRATHECAL

## 2015-05-28 MED ORDER — ACETAMINOPHEN 650 MG RE SUPP: 650.0000 mg | Freq: Four times a day (QID) | RECTAL | Status: DC | PRN

## 2015-05-28 MED ORDER — ONDANSETRON HCL 4 MG PO TABS: 4.0000 mg | ORAL_TABLET | Freq: Four times a day (QID) | ORAL | Status: DC | PRN

## 2015-05-28 MED ORDER — LIDOCAINE HCL (CARDIAC) 20 MG/ML IV SOLN
INTRAVENOUS | Status: DC | PRN
  Administered 2015-05-28: 40 mg via INTRAVENOUS

## 2015-05-28 MED ORDER — DOCUSATE SODIUM 100 MG PO CAPS
100.0000 mg | ORAL_CAPSULE | Freq: Two times a day (BID) | ORAL | Status: DC
  Administered 2015-05-28 – 2015-05-30 (×4): 100 mg via ORAL

## 2015-05-28 MED ORDER — TRANEXAMIC ACID 1000 MG/10ML IV SOLN
1000.0000 mg | Freq: Once | INTRAVENOUS | Status: AC
  Administered 2015-05-28: 1000 mg via INTRAVENOUS
  Filled 2015-05-28: qty 10

## 2015-05-28 MED ORDER — TRANEXAMIC ACID 1000 MG/10ML IV SOLN
1000.0000 mg | INTRAVENOUS | Status: AC
  Administered 2015-05-28: 1000 mg via INTRAVENOUS
  Filled 2015-05-28: qty 10

## 2015-05-28 MED ORDER — SODIUM CHLORIDE 0.9 % IV SOLN
INTRAVENOUS | Status: DC
  Administered 2015-05-28 – 2015-05-29 (×2): via INTRAVENOUS

## 2015-05-28 MED ORDER — SODIUM CHLORIDE 0.9 % IR SOLN
Status: DC | PRN
  Administered 2015-05-28: 1000 mL

## 2015-05-28 MED ORDER — OXYCODONE HCL 5 MG PO TABS
5.0000 mg | ORAL_TABLET | ORAL | Status: DC | PRN
  Administered 2015-05-28: 5 mg via ORAL
  Administered 2015-05-28 – 2015-05-30 (×11): 10 mg via ORAL
  Filled 2015-05-28 (×11): qty 2
  Filled 2015-05-28: qty 1

## 2015-05-28 MED ORDER — PANTOPRAZOLE SODIUM 40 MG PO TBEC
40.0000 mg | DELAYED_RELEASE_TABLET | Freq: Every day | ORAL | Status: DC
  Administered 2015-05-29 – 2015-05-30 (×2): 40 mg via ORAL
  Filled 2015-05-28 (×3): qty 1

## 2015-05-28 MED ORDER — FENTANYL CITRATE (PF) 100 MCG/2ML IJ SOLN
INTRAMUSCULAR | Status: DC | PRN
  Administered 2015-05-28 (×2): 50 ug via INTRAVENOUS

## 2015-05-28 MED ORDER — BUPIVACAINE LIPOSOME 1.3 % IJ SUSP
INTRAMUSCULAR | Status: DC | PRN
  Administered 2015-05-28: 20 mL

## 2015-05-28 MED ORDER — METOCLOPRAMIDE HCL 10 MG PO TABS: 5.0000 mg | ORAL_TABLET | Freq: Three times a day (TID) | ORAL | Status: DC | PRN

## 2015-05-28 MED ORDER — DEXAMETHASONE SODIUM PHOSPHATE 10 MG/ML IJ SOLN
INTRAMUSCULAR | Status: AC
  Filled 2015-05-28: qty 1

## 2015-05-28 MED ORDER — SODIUM CHLORIDE 0.9 % IJ SOLN
INTRAMUSCULAR | Status: AC
  Filled 2015-05-28: qty 50

## 2015-05-28 MED ORDER — CEFAZOLIN SODIUM-DEXTROSE 2-3 GM-% IV SOLR
2.0000 g | Freq: Four times a day (QID) | INTRAVENOUS | Status: AC
  Administered 2015-05-28 – 2015-05-29 (×2): 2 g via INTRAVENOUS
  Filled 2015-05-28 (×2): qty 50

## 2015-05-28 MED ORDER — FUROSEMIDE 40 MG PO TABS
40.0000 mg | ORAL_TABLET | Freq: Every day | ORAL | Status: DC
  Administered 2015-05-29 – 2015-05-30 (×2): 40 mg via ORAL
  Filled 2015-05-28 (×2): qty 1

## 2015-05-28 MED ORDER — MIDAZOLAM HCL 2 MG/2ML IJ SOLN
INTRAMUSCULAR | Status: AC
  Filled 2015-05-28: qty 2

## 2015-05-28 MED ORDER — POLYETHYLENE GLYCOL 3350 17 G PO PACK: 17.0000 g | PACK | Freq: Every day | ORAL | Status: DC | PRN

## 2015-05-28 MED ORDER — LIDOCAINE HCL (CARDIAC) 20 MG/ML IV SOLN
INTRAVENOUS | Status: AC
  Filled 2015-05-28: qty 5

## 2015-05-28 MED ORDER — PROPOFOL 10 MG/ML IV BOLUS
INTRAVENOUS | Status: AC
  Filled 2015-05-28: qty 20

## 2015-05-28 MED ORDER — BUPIVACAINE HCL 0.25 % IJ SOLN
INTRAMUSCULAR | Status: DC | PRN
  Administered 2015-05-28: 20 mL

## 2015-05-28 MED ORDER — PHENYLEPHRINE HCL 10 MG/ML IJ SOLN
INTRAMUSCULAR | Status: DC | PRN
  Administered 2015-05-28: 80 ug via INTRAVENOUS
  Administered 2015-05-28: 40 ug via INTRAVENOUS

## 2015-05-28 MED ORDER — CEFAZOLIN SODIUM-DEXTROSE 2-3 GM-% IV SOLR
2.0000 g | INTRAVENOUS | Status: AC
  Administered 2015-05-28: 2 g via INTRAVENOUS

## 2015-05-28 MED ORDER — MORPHINE SULFATE (PF) 2 MG/ML IV SOLN
1.0000 mg | INTRAVENOUS | Status: DC | PRN
  Administered 2015-05-28: 1 mg via INTRAVENOUS
  Filled 2015-05-28: qty 1

## 2015-05-28 MED ORDER — 0.9 % SODIUM CHLORIDE (POUR BTL) OPTIME
TOPICAL | Status: DC | PRN
  Administered 2015-05-28: 1000 mL

## 2015-05-28 MED ORDER — ONDANSETRON HCL 4 MG/2ML IJ SOLN
INTRAMUSCULAR | Status: AC
Start: 2015-05-28 — End: 2015-05-28
  Filled 2015-05-28: qty 2

## 2015-05-28 MED ORDER — CEFAZOLIN SODIUM-DEXTROSE 2-3 GM-% IV SOLR
INTRAVENOUS | Status: AC
  Filled 2015-05-28: qty 50

## 2015-05-28 MED ORDER — ACETAMINOPHEN 10 MG/ML IV SOLN
1000.0000 mg | Freq: Once | INTRAVENOUS | Status: AC
  Administered 2015-05-28: 1000 mg via INTRAVENOUS
  Filled 2015-05-28: qty 100

## 2015-05-28 MED ORDER — SODIUM CHLORIDE 0.9 % IJ SOLN
INTRAMUSCULAR | Status: DC | PRN
  Administered 2015-05-28: 30 mL

## 2015-05-28 MED ORDER — MEPERIDINE HCL 50 MG/ML IJ SOLN: 6.2500 mg | INTRAMUSCULAR | Status: DC | PRN

## 2015-05-28 MED ORDER — LACTATED RINGERS IV SOLN
INTRAVENOUS | Status: DC
  Administered 2015-05-28: 10:00:00 via INTRAVENOUS
  Administered 2015-05-28: 1000 mL via INTRAVENOUS
  Administered 2015-05-28: 10:00:00 via INTRAVENOUS

## 2015-05-28 MED ORDER — PROPOFOL 10 MG/ML IV BOLUS
INTRAVENOUS | Status: DC | PRN
  Administered 2015-05-28: 30 mg via INTRAVENOUS

## 2015-05-28 SURGICAL SUPPLY — 48 items
BAG DECANTER FOR FLEXI CONT (MISCELLANEOUS) ×2 IMPLANT
BAG ZIPLOCK 12X15 (MISCELLANEOUS) ×2 IMPLANT
BANDAGE ACE 6X5 VEL STRL LF (GAUZE/BANDAGES/DRESSINGS) ×2 IMPLANT
BLADE SAG 18X100X1.27 (BLADE) ×2 IMPLANT
BLADE SAW SGTL 11.0X1.19X90.0M (BLADE) ×2 IMPLANT
BOWL SMART MIX CTS (DISPOSABLE) ×2 IMPLANT
CAP KNEE TOTAL 3 SIGMA ×2 IMPLANT
CEMENT HV SMART SET (Cement) ×4 IMPLANT
CLOTH BEACON ORANGE TIMEOUT ST (SAFETY) ×2 IMPLANT
CUFF TOURN SGL QUICK 34 (TOURNIQUET CUFF) ×2
CUFF TRNQT CYL 34X4X40X1 (TOURNIQUET CUFF) ×1 IMPLANT
DECANTER SPIKE VIAL GLASS SM (MISCELLANEOUS) ×2 IMPLANT
DRAPE U-SHAPE 47X51 STRL (DRAPES) ×2 IMPLANT
DRSG ADAPTIC 3X8 NADH LF (GAUZE/BANDAGES/DRESSINGS) ×2 IMPLANT
DRSG PAD ABDOMINAL 8X10 ST (GAUZE/BANDAGES/DRESSINGS) ×2 IMPLANT
DURAPREP 26ML APPLICATOR (WOUND CARE) ×2 IMPLANT
ELECT REM PT RETURN 9FT ADLT (ELECTROSURGICAL) ×2
ELECTRODE REM PT RTRN 9FT ADLT (ELECTROSURGICAL) ×1 IMPLANT
EVACUATOR 1/8 PVC DRAIN (DRAIN) ×2 IMPLANT
GAUZE SPONGE 4X4 12PLY STRL (GAUZE/BANDAGES/DRESSINGS) ×2 IMPLANT
GLOVE BIO SURGEON STRL SZ 6 (GLOVE) ×2 IMPLANT
GLOVE BIO SURGEON STRL SZ7.5 (GLOVE) ×2 IMPLANT
GLOVE BIO SURGEON STRL SZ8 (GLOVE) ×2 IMPLANT
GLOVE BIOGEL PI IND STRL 6.5 (GLOVE) ×2 IMPLANT
GLOVE BIOGEL PI IND STRL 8 (GLOVE) ×2 IMPLANT
GLOVE BIOGEL PI INDICATOR 6.5 (GLOVE) ×2
GLOVE BIOGEL PI INDICATOR 8 (GLOVE) ×2
GLOVE SURG SS PI 6.5 STRL IVOR (GLOVE) ×2 IMPLANT
GLOVE SURG SS PI 7.0 STRL IVOR (GLOVE) ×2 IMPLANT
GOWN STRL REUS W/TWL LRG LVL3 (GOWN DISPOSABLE) ×2 IMPLANT
GOWN STRL REUS W/TWL XL LVL3 (GOWN DISPOSABLE) ×6 IMPLANT
HANDPIECE INTERPULSE COAX TIP (DISPOSABLE) ×2
IMMOBILIZER KNEE 20 (SOFTGOODS) ×2
IMMOBILIZER KNEE 20 THIGH 36 (SOFTGOODS) ×1 IMPLANT
MANIFOLD NEPTUNE II (INSTRUMENTS) ×2 IMPLANT
PACK TOTAL KNEE CUSTOM (KITS) ×2 IMPLANT
PAD ABD 8X10 STRL (GAUZE/BANDAGES/DRESSINGS) ×2 IMPLANT
PADDING CAST COTTON 6X4 STRL (CAST SUPPLIES) ×4 IMPLANT
POSITIONER SURGICAL ARM (MISCELLANEOUS) ×2 IMPLANT
SET HNDPC FAN SPRY TIP SCT (DISPOSABLE) ×1 IMPLANT
STRIP CLOSURE SKIN 1/2X4 (GAUZE/BANDAGES/DRESSINGS) ×2 IMPLANT
SUT MNCRL AB 4-0 PS2 18 (SUTURE) ×2 IMPLANT
SUT VIC AB 2-0 CT1 27 (SUTURE) ×6
SUT VIC AB 2-0 CT1 TAPERPNT 27 (SUTURE) ×3 IMPLANT
SUT VLOC 180 0 24IN GS25 (SUTURE) ×2 IMPLANT
TRAY FOLEY W/METER SILVER 14FR (SET/KITS/TRAYS/PACK) ×2 IMPLANT
WRAP KNEE MAXI GEL POST OP (GAUZE/BANDAGES/DRESSINGS) ×2 IMPLANT
YANKAUER SUCT BULB TIP 10FT TU (MISCELLANEOUS) ×2 IMPLANT

## 2015-05-28 NOTE — Evaluation (Signed)
Physical Therapy Evaluation Patient Details Name: Kaylee Davis MRN: [id number] DOB: 09-17-1940 Today's Date: 05/28/2015   History of Present Illness  R TkR; s/p L TKR 2014  Clinical Impression  Pt s/p R TKR presents with decreased R LE strength/ROM and post op pain limiting functional mobility.  Pt should progress to dc home with family assist and HHPT follow up.    Follow Up Recommendations Home health PT    Equipment Recommendations  None recommended by PT    Recommendations for Other Services OT consult     Precautions / Restrictions Precautions Precautions: Knee;Fall Required Braces or Orthoses: Knee Immobilizer - Right Knee Immobilizer - Right: Discontinue once straight leg raise with < 10 degree lag Restrictions Weight Bearing Restrictions: No Other Position/Activity Restrictions: WBAT      Mobility  Bed Mobility Overal bed mobility: Needs Assistance Bed Mobility: Supine to Sit     Supine to sit: Min assist;Mod assist     General bed mobility comments: cues for sequence and use of L LE to self assist  Transfers Overall transfer level: Needs assistance Equipment used: Rolling walker (2 wheeled) Transfers: Sit to/from Stand Sit to Stand: Min assist;Mod assist;From elevated surface         General transfer comment: cues for LE management and use of UEs to self assist  Ambulation/Gait Ambulation/Gait assistance: Min assist;Mod assist;+2 physical assistance;+2 safety/equipment Ambulation Distance (Feet): 6 Feet Assistive device: Rolling walker (2 wheeled) Gait Pattern/deviations: Step-to pattern;Decreased step length - right;Decreased step length - left;Shuffle;Trunk flexed Gait velocity: decr   General Gait Details: cues for sequence, posture and position from RW.  Distance ltd by onset dizziness/nausea  Information systems manager Rankin (Stroke Patients Only)       Balance                                              Pertinent Vitals/Pain Pain Assessment: 0-10 Pain Score: 5  Pain Location: R knee Pain Descriptors / Indicators: Aching;Sore Pain Intervention(s): Limited activity within patient's tolerance;Monitored during session;Premedicated before session;Ice applied    Home Living Family/patient expects to be discharged to:: Private residence Living Arrangements: Spouse/significant other Available Help at Discharge: Family Type of Home: House Home Access: Level entry     Home Layout: Two level Home Equipment: Environmental consultant - 2 wheels Additional Comments: Pt has lift to second level    Prior Function Level of Independence: Independent               Hand Dominance        Extremity/Trunk Assessment   Upper Extremity Assessment: Overall WFL for tasks assessed           Lower Extremity Assessment: RLE deficits/detail      Cervical / Trunk Assessment: Normal  Communication   Communication: No difficulties  Cognition Arousal/Alertness: Awake/alert Behavior During Therapy: WFL for tasks assessed/performed Overall Cognitive Status: Within Functional Limits for tasks assessed                      General Comments      Exercises Total Joint Exercises Ankle Circles/Pumps: AROM;Both;15 reps;Supine      Assessment/Plan    PT Assessment Patient needs continued PT services  PT Diagnosis Difficulty walking   PT Problem List Decreased  strength;Decreased range of motion;Decreased activity tolerance;Decreased mobility;Decreased knowledge of use of DME;Pain;Decreased knowledge of precautions  PT Treatment Interventions DME instruction;Gait training;Stair training;Functional mobility training;Therapeutic activities;Therapeutic exercise;Patient/family education   PT Goals (Current goals can be found in the Care Plan section) Acute Rehab PT Goals Patient Stated Goal: Resume previous lifestyle with decreased pain PT Goal Formulation: With patient Time  For Goal Achievement: 05/31/15 Potential to Achieve Goals: Good    Frequency 7X/week   Barriers to discharge        Co-evaluation               End of Session Equipment Utilized During Treatment: Gait belt;Right knee immobilizer Activity Tolerance: Other (comment) (nausea) Patient left: in chair;with call bell/phone within reach Nurse Communication: Mobility status         Time: [id number] PT Time Calculation (min) (ACUTE ONLY): 29 min   Charges:   PT Evaluation $PT Eval Low Complexity: 1 Procedure PT Treatments $Gait Training: 8-22 mins   PT G Codes:        Kaylee Davis 05/28/2015, 6:29 PM

## 2015-05-28 NOTE — Transfer of Care (Signed)
Immediate Anesthesia Transfer of Care Note  Patient: Kaylee Davis  Procedure(s) Performed: Procedure(s): RIGHT TOTAL KNEE ARTHROPLASTY (Right)  Patient Location: PACU  Anesthesia Type:spinal  Level of Consciousness: sedated  Airway & Oxygen Therapy: Patient Spontanous Breathing and Patient connected to face mask oxygen  Post-op Assessment: Report given to RN and Post -op Vital signs reviewed and stable  Post vital signs: Reviewed and stable  Last Vitals:  Filed Vitals:   05/28/15 0721  BP: 161/75  Pulse: 90  Temp: 36.7 C  Resp: 18    Complications: No apparent anesthesia complications

## 2015-05-28 NOTE — Interval H&P Note (Signed)
History and Physical Interval Note:  05/28/2015 8:38 AM  Kaylee Davis  has presented today for surgery, with the diagnosis of right knee osteoarthritis  The various methods of treatment have been discussed with the patient and family. After consideration of risks, benefits and other options for treatment, the patient has consented to  Procedure(s): RIGHT TOTAL KNEE ARTHROPLASTY (Right) as a surgical intervention .  The patient's history has been reviewed, patient examined, no change in status, stable for surgery.  I have reviewed the patient's chart and labs.  Questions were answered to the patient's satisfaction.     Loanne DrillingALUISIO,Jovani Flury V

## 2015-05-28 NOTE — Progress Notes (Signed)
Patient concerned because husband became sick suddenly last night w diarrhea nad fever.  She is asymptomatic

## 2015-05-28 NOTE — Progress Notes (Signed)
Utilization review completed.  

## 2015-05-28 NOTE — Anesthesia Preprocedure Evaluation (Signed)
Anesthesia Evaluation  Patient identified by MRN, date of birth, ID band Patient awake    Reviewed: Allergy & Precautions, H&P , NPO status , Patient's Chart, lab work & pertinent test results  Airway Mallampati: II  TM Distance: >3 FB Neck ROM: Full    Dental no notable dental hx. (+) Teeth Intact   Pulmonary neg pulmonary ROS,    Pulmonary exam normal breath sounds clear to auscultation       Cardiovascular hypertension, Pt. on medications Normal cardiovascular exam+ dysrhythmias Supra Ventricular Tachycardia  Rhythm:Regular Rate:Normal     Neuro/Psych negative neurological ROS  negative psych ROS   GI/Hepatic Neg liver ROS, PUD, GERD  ,  Endo/Other  negative endocrine ROS  Renal/GU negative Renal ROS  negative genitourinary   Musculoskeletal negative musculoskeletal ROS (+)   Abdominal   Peds negative pediatric ROS (+)  Hematology negative hematology ROS (+)   Anesthesia Other Findings   Reproductive/Obstetrics negative OB ROS                             Anesthesia Physical  Anesthesia Plan  ASA: II  Anesthesia Plan: Spinal   Post-op Pain Management:    Induction:   Airway Management Planned: Simple Face Mask  Additional Equipment:   Intra-op Plan:   Post-operative Plan:   Informed Consent: I have reviewed the patients History and Physical, chart, labs and discussed the procedure including the risks, benefits and alternatives for the proposed anesthesia with the patient or authorized representative who has indicated his/her understanding and acceptance.   Dental advisory given  Plan Discussed with: CRNA  Anesthesia Plan Comments:         Anesthesia Quick Evaluation

## 2015-05-28 NOTE — Anesthesia Procedure Notes (Signed)
Spinal Patient location during procedure: OR Start time: 05/28/2015 9:25 AM End time: 05/28/2015 9:26 AM Reason for block: at surgeon's request Staffing Resident/CRNA: Freddie Breech Performed by: resident/CRNA  Preanesthetic Checklist Completed: patient identified, site marked, surgical consent, pre-op evaluation, timeout performed, IV checked, risks and benefits discussed, monitors and equipment checked and at surgeon's request Spinal Block Patient position: sitting Prep: Betadine Patient monitoring: heart rate, continuous pulse ox and blood pressure Approach: midline Location: L3-4 Injection technique: single-shot Needle Needle type: Sprotte  Needle gauge: 24 G Needle length: 10 cm Assessment Sensory level: T6 Additional Notes Expiration date of kit checked and confirmed. Patient tolerated procedure well, without complications. X 1 attempt with noted clear CSF return. Loss of motor and sensory on exam post injection.

## 2015-05-28 NOTE — Anesthesia Postprocedure Evaluation (Signed)
Anesthesia Post Note  Patient: Kaylee Davis  Procedure(s) Performed: Procedure(s) (LRB): RIGHT TOTAL KNEE ARTHROPLASTY (Right)  Patient location during evaluation: PACU Anesthesia Type: Spinal and MAC Level of consciousness: awake and alert Pain management: pain level controlled Vital Signs Assessment: post-procedure vital signs reviewed and stable Respiratory status: spontaneous breathing and respiratory function stable Cardiovascular status: blood pressure returned to baseline and stable Postop Assessment: spinal receding Anesthetic complications: no    Last Vitals:  Filed Vitals:   05/28/15 1154 05/28/15 1359  BP: 136/61 153/81  Pulse: 69 126  Temp: 36.5 C 36.5 C  Resp: 16 16    Last Pain:  Filed Vitals:   05/28/15 1413  PainSc: 1                  Adonia Porada [employer]

## 2015-05-28 NOTE — Op Note (Signed)
Pre-operative diagnosis- Osteoarthritis Right knee(s)  Post-operative diagnosis- Osteoarthritis  Right knee(s)  Procedure-   Right Total Knee Arthroplasty  Surgeon- Gus RankinFrank V. Jenesys Casseus, MD  Assistant- Avel Peacerew Perkins, PA-C   Anesthesia-  Spinal   EBL- * No blood loss amount entered *   Drains Hemovac   Tourniquet time  Total Tourniquet Time Documented: Thigh (Right) - 28 minutes Total: Thigh (Right) - 28 minutes    Complications- None  Condition-PACU - hemodynamically stable.   Brief Clinical Note  Kaylee Davis is a 75 y.o. year old female with end stage OA of her right knee with progressively worsening pain and dysfunction. She has constant pain, with activity and at rest and significant functional deficits with difficulties even with ADLs. She has had extensive non-op management including analgesics, injections of cortisone and viscosupplements, and home exercise program, but remains in significant pain with significant dysfunction.Radiographs show bone on bone arthritis lateral and patellofemoral with valgus deformity. She presents now for right Total Knee Arthroplasty.    Procedure in detail---       The patient is brought into the operating room and positioned supine on the operating table. After successful administration of Spinal anesthetic, a tourniquet is placed high on the Right thigh(s) and the lower extremity is prepped and draped in the usual sterile fashion. Time out is performed by the operating team and then the Right  lower extremity is wrapped in Esmarch, knee flexed and the tourniquet inflated to 300 mmHg.       A midline incision is made with a ten blade through the subcutaneous tissue to the level of the extensor mechanism. A fresh blade is used to make a lateral parapatellar arthrotomy due to the patients' valgus deformity. Soft tissue over the proximal lateral tibia is subperiosteally elevated to the joint line with a knife to the posterolateral corner but not  including the structures of the posterolateral corner. Soft tissue over the proximal medial tibia is elevated with attention being paid to avoiding the patellar tendon on the tibial tubercle. The patella is everted medially, knee flexed 90 degrees and the ACL and PCL are removed. Findings are bone on bone lateral and patellofemoral with large lateral osteophytes .       The drill is used to create a starting hole in the distal femur and the canal is thoroughly irrigated with sterile saline to remove the fatty contents. The 5 degree Right  valgus alignment guide is placed into the femoral canal and the distal femoral cutting block is pinned to remove 10  mm off the distal femur. Resection is made with an oscillating saw.      The tibia is subluxed forward and the menisci are removed. The extramedullary alignment guide is placed referencing proximally at the medial aspect of the tibial tubercle and distally along the second metatarsal axis and tibial crest. The block is pinned to remove 2mm off the more deficient lateral side. Resection is made with an oscillating saw. Size 3  is the most appropriate size for the tibia and the proximal tibia is prepared with the modular drill and keel punch for that size.      The femoral sizing guide is placed and size 2.5  is most appropriate. Rotation is marked off the epicondylar axis and confirmed by creating a rectangular flexion gap at 90 degrees. The size 2.5  cutting block is pinned in this rotation and the anterior, posterior and chamfer cuts are made with the oscillating saw. The  intercondylar block is then placed and that cut is made.      Trial size 3  tibial component, trial size 2.5  posterior stabilized femur and a 12.5 mm posterior stabilized rotating platform insert trial is placed. Full extension is achieved with excellent varus/valgus and   anterior/posterior balance throughout full range of motion. The patella is everted and thickness measured to be 22  mm.  Free hand resection is taken to 12 mm, a 35 template is placed, lug holes are drilled, trial patella is placed, and it tracks normally. Osteophytes are removed off the posterior femur with the trial in place. All trials are removed and the cut bone surfaces prepared with pulsatile lavage. Cement is mixed and once ready for implantation, the size 3  tibial implant, size 2.5 posterior stabilized femoral component, and the size 35  patella are cemented in place and the patella is held with the clamp. The trial insert is placed and the knee held in full extension. The Exparel (20 ml mixed with 30 ml saline) and then 20 ml of .25% Bupivicaine is injected into the extensor mechanism, posterior capsule, medial and lateral gutters and subcutaneous tissues. All extruded cement is removed and once the cement is hard the permanent 12.5  mm posterior stabilized rotating platform insert is placed into the tibial tray.      The wound is copiously irrigated with saline solution and the tourniquet is released for a total   tourniquet time of 27  minutes. Bleeding is identified and controlled with electrocautery. The extensor mechanism is closed with interrupted #1 V-loc leaving open a small area from the superior to inferior pole of the patella to serve as a mini lateral release. Flexion against gravity is 140  degrees and the patella tracks normally. Subcutaneous tissue is closed with 2.0 vicryl and subcuticular with running 4.0 Monocryl.The incision is cleaned and dried and steri-strips and a bulky sterile dressing are applied. The limb is placed into a knee immobilizer and the patient is awakened and transported to recovery in stable condition.      Please note that a surgical assistant was a medical necessity for this procedure in order to perform it in a safe and expeditious manner. Surgical assistant was necessary to retract the ligaments and vital neurovascular structures to prevent injury to them and also necessary for  proper positioning of the limb to allow for anatomic placement of the prosthesis.    Gus Rankin [name], MD    05/28/2015, 10:32 AM

## 2015-05-29 LAB — BASIC METABOLIC PANEL
ANION GAP: 10 (ref 5–15)
BUN: 9 mg/dL (ref 6–20)
CALCIUM: 8.9 mg/dL (ref 8.9–10.3)
CO2: 25 mmol/L (ref 22–32)
CREATININE: 0.52 mg/dL (ref 0.44–1.00)
Chloride: 106 mmol/L (ref 101–111)
GLUCOSE: 118 mg/dL — AB (ref 65–99)
Potassium: 3.5 mmol/L (ref 3.5–5.1)
Sodium: 141 mmol/L (ref 135–145)

## 2015-05-29 LAB — CBC
HCT: 36.9 % (ref 36.0–46.0)
Hemoglobin: 12.1 g/dL (ref 12.0–15.0)
MCH: 30.5 pg (ref 26.0–34.0)
MCHC: 32.8 g/dL (ref 30.0–36.0)
MCV: 92.9 fL (ref 78.0–100.0)
PLATELETS: 307 10*3/uL (ref 150–400)
RBC: 3.97 MIL/uL (ref 3.87–5.11)
RDW: 12.7 % (ref 11.5–15.5)
WBC: 13 10*3/uL — AB (ref 4.0–10.5)

## 2015-05-29 MED ORDER — RIVAROXABAN 10 MG PO TABS: 10.0000 mg | ORAL_TABLET | Freq: Every day | ORAL | Status: DC

## 2015-05-29 MED ORDER — OXYCODONE HCL 5 MG PO TABS: 5.0000 mg | ORAL_TABLET | ORAL | Status: DC | PRN

## 2015-05-29 MED ORDER — TRAMADOL HCL 50 MG PO TABS: 50.0000 mg | ORAL_TABLET | Freq: Four times a day (QID) | ORAL | Status: DC | PRN

## 2015-05-29 MED ORDER — METHOCARBAMOL 500 MG PO TABS: 500.0000 mg | ORAL_TABLET | Freq: Four times a day (QID) | ORAL | Status: DC | PRN

## 2015-05-29 NOTE — Progress Notes (Signed)
Physical Therapy Treatment Patient Details Name: Kaylee HastenBrenda B Davis MRN: [id number] DOB: [birth year] Today's Date: 05/29/2015    History of Present Illness R TkR; s/p L TKR 2014    PT Comments    Steady progress with mobility.  Pt clarifying that she has 2 steps to platform to use lift to second floor.  Follow Up Recommendations  Home health PT     Equipment Recommendations  None recommended by PT    Recommendations for Other Services OT consult     Precautions / Restrictions Precautions Precautions: Knee;Fall Required Braces or Orthoses: Knee Immobilizer - Right Knee Immobilizer - Right: Discontinue once straight leg raise with < 10 degree lag Restrictions Weight Bearing Restrictions: No Other Position/Activity Restrictions: WBAT    Mobility  Bed Mobility Overal bed mobility: Needs Assistance Bed Mobility: Sit to Supine       Sit to supine: Min assist   General bed mobility comments: cues for sequence with pt self assisting R LE with L LE  Transfers Overall transfer level: Needs assistance Equipment used: Rolling walker (2 wheeled) Transfers: Sit to/from Stand Sit to Stand: Min guard         General transfer comment: cues for LE management and use of UEs to self assist  Ambulation/Gait Ambulation/Gait assistance: Min assist;Min guard Ambulation Distance (Feet): 68 Feet Assistive device: Rolling walker (2 wheeled) Gait Pattern/deviations: Step-to pattern;Decreased step length - right;Decreased step length - left;Shuffle;Trunk flexed Gait velocity: decr   General Gait Details: cues for sequence, posture and position from RW.  Distance ltd by onset dizziness/nausea   Information systems managertairs            Wheelchair Mobility    Modified Rankin (Stroke Patients Only)       Balance                                    Cognition Arousal/Alertness: Awake/alert Behavior During Therapy: WFL for tasks assessed/performed Overall Cognitive Status: Within  Functional Limits for tasks assessed                      Exercises      General Comments        Pertinent Vitals/Pain Pain Assessment: 0-10 Pain Score: 5  Pain Location: R knee Pain Descriptors / Indicators: Aching;Sore Pain Intervention(s): Limited activity within patient's tolerance;Monitored during session;Premedicated before session;Ice applied    Home Living                      Prior Function            PT Goals (current goals can now be found in the care plan section) Acute Rehab PT Goals Patient Stated Goal: Resume previous lifestyle with decreased pain PT Goal Formulation: With patient Time For Goal Achievement: 05/31/15 Potential to Achieve Goals: Good Progress towards PT goals: Progressing toward goals    Frequency  7X/week    PT Plan Current plan remains appropriate    Co-evaluation             End of Session Equipment Utilized During Treatment: Gait belt;Right knee immobilizer Activity Tolerance: Patient tolerated treatment well Patient left: in bed;with call bell/phone within reach     Time: 1434-1451 PT Time Calculation (min) (ACUTE ONLY): 17 min  Charges:  $Gait Training: 8-22 mins  G Codes:      Kaylee Davis 06-21-2015, 4:39 PM

## 2015-05-29 NOTE — Discharge Instructions (Signed)
° °Dr. Frank Aluisio °Total Joint Specialist °[facility] °[address] °[place], [place] [address] °[phone] ° °TOTAL KNEE REPLACEMENT POSTOPERATIVE DIRECTIONS ° °Knee Rehabilitation, Guidelines Following Surgery  °Results after knee surgery are often greatly improved when you follow the exercise, range of motion and muscle strengthening exercises prescribed by your doctor. Safety measures are also important to protect the knee from further injury. Any time any of these exercises cause you to have increased pain or swelling in your knee joint, decrease the amount until you are comfortable again and slowly increase them. If you have problems or questions, call your caregiver or physical therapist for advice.  ° °HOME CARE INSTRUCTIONS  °Remove items at home which could result in a fall. This includes throw rugs or furniture in walking pathways.  °· ICE to the affected knee every three hours for 30 minutes at a time and then as needed for pain and swelling.  Continue to use ice on the knee for pain and swelling from surgery. You may notice swelling that will progress down to the foot and ankle.  This is normal after surgery.  Elevate the leg when you are not up walking on it.   °· Continue to use the breathing machine which will help keep your temperature down.  It is common for your temperature to cycle up and down following surgery, especially at night when you are not up moving around and exerting yourself.  The breathing machine keeps your lungs expanded and your temperature down. °· Do not place pillow under knee, focus on keeping the knee straight while resting ° °DIET °You may resume your previous home diet once your are discharged from the hospital. ° °DRESSING / WOUND CARE / SHOWERING °You may start showering once you are discharged home but do not submerge the incision under water. Just pat the incision dry and apply a dry gauze dressing on daily. °Change the surgical dressing  daily and reapply a dry dressing each time. ° °ACTIVITY °Walk with your walker as instructed. °Use walker as long as suggested by your caregivers. °Avoid periods of inactivity such as sitting longer than an hour when not asleep. This helps prevent blood clots.  °You may resume a sexual relationship in one month or when given the OK by your doctor.  °You may return to work once you are cleared by your doctor.  °Do not drive a car for 6 weeks or until released by you surgeon.  °Do not drive while taking narcotics. ° °WEIGHT BEARING °Weight bearing as tolerated with assist device (walker, cane, etc) as directed, use it as long as suggested by your surgeon or therapist, typically at least 4-6 weeks. ° °POSTOPERATIVE CONSTIPATION PROTOCOL °Constipation - defined medically as fewer than three stools per week and severe constipation as less than one stool per week. ° °One of the most common issues patients have following surgery is constipation.  Even if you have a regular bowel pattern at home, your normal regimen is likely to be disrupted due to multiple reasons following surgery.  Combination of anesthesia, postoperative narcotics, change in appetite and fluid intake all can affect your bowels.  In order to avoid complications following surgery, here are some recommendations in order to help you during your recovery period. ° °Colace (docusate) - Pick up an over-the-counter form of Colace or another stool softener and take twice a day as long as you are requiring postoperative pain medications.  Take with a full glass of water   daily.  If you experience loose stools or diarrhea, hold the colace until you stool forms back up.  If your symptoms do not get better within 1 week or if they get worse, check with your doctor. ° °Dulcolax (bisacodyl) - Pick up over-the-counter and take as directed by the product packaging as needed to assist with the movement of your bowels.  Take with a full glass of water.  Use this product as  needed if not relieved by Colace only.  ° °MiraLax (polyethylene glycol) - Pick up over-the-counter to have on hand.  MiraLax is a solution that will increase the amount of water in your bowels to assist with bowel movements.  Take as directed and can mix with a glass of water, juice, soda, coffee, or tea.  Take if you go more than two days without a movement. °Do not use MiraLax more than once per day. Call your doctor if you are still constipated or irregular after using this medication for 7 days in a row. ° °If you continue to have problems with postoperative constipation, please contact the office for further assistance and recommendations.  If you experience "the worst abdominal pain ever" or develop nausea or vomiting, please contact the office immediatly for further recommendations for treatment. ° °ITCHING ° If you experience itching with your medications, try taking only a single pain pill, or even half a pain pill at a time.  You can also use Benadryl over the counter for itching or also to help with sleep.  ° °TED HOSE STOCKINGS °Wear the elastic stockings on both legs for three weeks following surgery during the day but you may remove then at night for sleeping. ° °MEDICATIONS °See your medication summary on the “After Visit Summary” that the nursing staff will review with you prior to discharge.  You may have some home medications which will be placed on hold until you complete the course of blood thinner medication.  It is important for you to complete the blood thinner medication as prescribed by your surgeon.  Continue your approved medications as instructed at time of discharge. ° °PRECAUTIONS °If you experience chest pain or shortness of breath - call 911 immediately for transfer to the hospital emergency department.  °If you develop a fever greater that 101 F, purulent drainage from wound, increased redness or drainage from wound, foul odor from the wound/dressing, or calf pain - CONTACT YOUR  SURGEON.   °                                                °FOLLOW-UP APPOINTMENTS °Make sure you keep all of your appointments after your operation with your surgeon and caregivers. You should call the office at the above phone number and make an appointment for approximately two weeks after the date of your surgery or on the date instructed by your surgeon outlined in the "After Visit Summary". ° ° °RANGE OF MOTION AND STRENGTHENING EXERCISES  °Rehabilitation of the knee is important following a knee injury or an operation. After just a few days of immobilization, the muscles of the thigh which control the knee become weakened and shrink (atrophy). Knee exercises are designed to build up the tone and strength of the thigh muscles and to improve knee motion. Often times heat used for twenty to thirty minutes before working out will loosen   up your tissues and help with improving the range of motion but do not use heat for the first two weeks following surgery. These exercises can be done on a training (exercise) mat, on the floor, on a table or on a bed. Use what ever works the best and is most comfortable for you Knee exercises include:  °Leg Lifts - While your knee is still immobilized in a splint or cast, you can do straight leg raises. Lift the leg to 60 degrees, hold for 3 sec, and slowly lower the leg. Repeat 10-20 times 2-3 times daily. Perform this exercise against resistance later as your knee gets better.  °Quad and Hamstring Sets - Tighten up the muscle on the front of the thigh (Quad) and hold for 5-10 sec. Repeat this 10-20 times hourly. Hamstring sets are done by pushing the foot backward against an object and holding for 5-10 sec. Repeat as with quad sets.  °· Leg Slides: Lying on your back, slowly slide your foot toward your buttocks, bending your knee up off the floor (only go as far as is comfortable). Then slowly slide your foot back down until your leg is flat on the floor again. °· Angel Wings:  Lying on your back spread your legs to the side as far apart as you can without causing discomfort.  °A rehabilitation program following serious knee injuries can speed recovery and prevent re-injury in the future due to weakened muscles. Contact your doctor or a physical therapist for more information on knee rehabilitation.  ° °IF YOU ARE TRANSFERRED TO A SKILLED REHAB FACILITY °If the patient is transferred to a skilled rehab facility following release from the hospital, a list of the current medications will be sent to the facility for the patient to continue.  When discharged from the skilled rehab facility, please have the facility set up the patient's Home Health Physical Therapy prior to being released. Also, the skilled facility will be responsible for providing the patient with their medications at time of release from the facility to include their pain medication, the muscle relaxants, and their blood thinner medication. If the patient is still at the rehab facility at time of the two week follow up appointment, the skilled rehab facility will also need to assist the patient in arranging follow up appointment in our office and any transportation needs. ° °MAKE SURE YOU:  °Understand these instructions.  °Get help right away if you are not doing well or get worse.  ° ° °Pick up stool softner and laxative for home use following surgery while on pain medications. °Do not submerge incision under water. °Please use good hand washing techniques while changing dressing each day. °May shower starting three days after surgery. °Please use a clean towel to pat the incision dry following showers. °Continue to use ice for pain and swelling after surgery. °Do not use any lotions or creams on the incision until instructed by your surgeon. °Information on my medicine - XARELTO® (Rivaroxaban) ° °This medication education was reviewed with me or my healthcare representative as part of my discharge preparation.  The  pharmacist that spoke with me during my hospital stay was:  Karin Pinedo E, RPH ° °Why was Xarelto® prescribed for you? °Xarelto® was prescribed for you to reduce the risk of blood clots forming after orthopedic surgery. The medical term for these abnormal blood clots is venous thromboembolism (VTE). ° °What do you need to know about xarelto® ? °Take your Xarelto® ONCE DAILY at the   same time every day. °You may take it either with or without food. ° °If you have difficulty swallowing the tablet whole, you may crush it and mix in applesauce just prior to taking your dose. ° °Take Xarelto® exactly as prescribed by your doctor and DO NOT stop taking Xarelto® without talking to the doctor who prescribed the medication.  Stopping without other VTE prevention medication to take the place of Xarelto® may increase your risk of developing a clot. ° °After discharge, you should have regular check-up appointments with your healthcare provider that is prescribing your Xarelto®.   ° °What do you do if you miss a dose? °If you miss a dose, take it as soon as you remember on the same day then continue your regularly scheduled once daily regimen the next day. Do not take two doses of Xarelto® on the same day.  ° °Important Safety Information °A possible side effect of Xarelto® is bleeding. You should call your healthcare provider right away if you experience any of the following: °? Bleeding from an injury or your nose that does not stop. °? Unusual colored urine (red or dark brown) or unusual colored stools (red or black). °? Unusual bruising for unknown reasons. °? A serious fall or if you hit your head (even if there is no bleeding). ° °Some medicines may interact with Xarelto® and might increase your risk of bleeding while on Xarelto®. To help avoid this, consult your healthcare provider or pharmacist prior to using any new prescription or non-prescription medications, including herbals, vitamins, non-steroidal  anti-inflammatory drugs (NSAIDs) and supplements. ° °This website has more information on Xarelto®: www.xarelto.com. ° ° °

## 2015-05-29 NOTE — Progress Notes (Signed)
   Subjective: 1 Day Post-Op Procedure(s) (LRB): RIGHT TOTAL KNEE ARTHROPLASTY (Right) Patient reports pain as mild.   Patient seen in rounds with Dr. Lequita HaltAluisio. Patient is well, but has had some minor complaints of pain in the knee, requiring pain medications We will start therapy today.  Plan is to go Home after hospital stay.  Objective: Vital signs in last 24 hours: Temp:  [97.4 F (36.3 C)-98.5 F (36.9 C)] 97.4 F (36.3 C) (03/11 0545) Pulse Rate:  [64-126] 66 (03/11 0545) Resp:  [13-18] 15 (03/11 0545) BP: (120-153)/(55-81) 126/58 mmHg (03/11 0545) SpO2:  [95 %-100 %] 98 % (03/11 0545) Weight:  [77.565 kg (171 lb)] 77.565 kg (171 lb) (03/10 0728)  Intake/Output from previous day:  Intake/Output Summary (Last 24 hours) at 05/29/15 0722 Last data filed at 05/29/15 0546  Gross per 24 hour  Intake 4431.25 ml  Output   3350 ml  Net 1081.25 ml    Labs:  Recent Labs  05/29/15 0444  HGB 12.1    Recent Labs  05/29/15 0444  WBC 13.0*  RBC 3.97  HCT 36.9  PLT 307    Recent Labs  05/29/15 0444  NA 141  K 3.5  CL 106  CO2 25  BUN 9  CREATININE 0.52  GLUCOSE 118*  CALCIUM 8.9   No results for input(s): LABPT, INR in the last 72 hours.  EXAM General - Patient is Alert, Appropriate and Oriented Extremity - Neurovascular intact Sensation intact distally Dorsiflexion/Plantar flexion intact Dressing - dressing C/D/I Motor Function - intact, moving foot and toes well on exam.  Hemovac pulled without difficulty.  Past Medical History  Diagnosis Date  . Hypertension   . TMJ (temporomandibular joint disorder)     PAST HX GRINDING TEETH --BUT NO LONGER A PROBLEM  . Dyspepsia   . Migraine headache     much reduced frequency  . History of PSVT (paroxysmal supraventricular tachycardia)     can control by doing a valsalva. Triggered by elevated heart rate  . 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 GERD  (gastroesophageal reflux disease)   . Arthritis     OA BOTH KNEES; OCCAS PAIN LEFT SHOULDER - HX OF ROTATOR CUFF PROBLEM  . Tachycardia     Episodic  . Wears glasses   . Tingling     left foot   . Imbalance   . History of frequent urinary tract infections   . History of blood transfusion     1990's    Assessment/Plan: 1 Day Post-Op Procedure(s) (LRB): RIGHT TOTAL KNEE ARTHROPLASTY (Right) Principal Problem:   OA (osteoarthritis) of knee  Estimated body mass index is 27.19 kg/(m^2) as calculated from the following:   Height as of this encounter: 5' 6.5" (1.689 m).   Weight as of this encounter: 77.565 kg (171 lb). Up with therapy Plan for discharge tomorrow Discharge home with home health  DVT Prophylaxis - Xarelto Weight-Bearing as tolerated to right leg D/C O2 and Pulse OX and try on Room Air  Avel Peacerew Krikor Willet, PA-C Orthopaedic Surgery 05/29/2015, 7:22 AM

## 2015-05-29 NOTE — Progress Notes (Signed)
OT Cancellation Note  Patient Details Name: Kaylee Davis MRN: [id number] DOB: 03/17/1941   Cancelled Treatment:    Reason Eval/Treat Not Completed: OT screened, no needs identified, will sign off  Angelene GiovanniConarpe, Marjo Grosvenor M  Deloss Amico Jeffersononarpe, OTR/L [phone]  05/29/2015, 2:26 PM

## 2015-05-29 NOTE — Care Management Note (Signed)
Case Management Note  Patient Details  Name: Clearnce HastenBrenda B Fosberg MRN: [id number] Date of Birth: 1941-02-27  Subjective/Objective:    RIGHT TOTAL KNEE ARTHROPLASTY                 Action/Plan: NCM spoke to pt at bedside. States she has RW and 3n1 at home. [facility] Family at home to assist with her care.   Expected Discharge Date:  05/30/15               Expected Discharge Plan:  Home w Home Health Services  In-House Referral:  NA  Discharge planning Services  CM Consult  Post Acute Care Choice:  NA Choice offered to:  NA  DME Arranged:  N/A DME Agency:  NA  HH Arranged:  PT [facility]  Status of Service:  Completed, signed off  Medicare Important Message Given:    Date Medicare IM Given:    Medicare IM give by:    Date Additional Medicare IM Given:    Additional Medicare Important Message give by:     If discussed at Long Length of Stay Meetings, dates discussed:    Additional Comments:  Elliot CousinShavis, Daemon Dowty Ellen, RN 05/29/2015, 6:50 PM

## 2015-05-29 NOTE — Progress Notes (Signed)
Physical Therapy Treatment Patient Details Name: Kaylee Davis MRN: [name] DOB: 06/09/1940 Today's Date: 05/29/2015    History of Present Illness R TkR; s/p L TKR 2014    PT Comments    Pt motivated and progressing steadily with mobility.  Follow Up Recommendations  Home health PT     Equipment Recommendations  None recommended by PT    Recommendations for Other Services OT consult     Precautions / Restrictions Precautions Precautions: Knee;Fall Required Braces or Orthoses: Knee Immobilizer - Right Knee Immobilizer - Right: Discontinue once straight leg raise with < 10 degree lag Restrictions Weight Bearing Restrictions: No Other Position/Activity Restrictions: WBAT    Mobility  Bed Mobility               General bed mobility comments: OOB with nursing  Transfers Overall transfer level: Needs assistance Equipment used: Rolling walker (2 wheeled) Transfers: Sit to/from Stand Sit to Stand: Min assist;Mod assist         General transfer comment: cues for LE management and use of UEs to self assist  Ambulation/Gait Ambulation/Gait assistance: Min assist Ambulation Distance (Feet): 36 Feet Assistive device: Rolling walker (2 wheeled) Gait Pattern/deviations: Step-to pattern;Decreased step length - right;Decreased step length - left;Shuffle;Trunk flexed Gait velocity: decr   General Gait Details: cues for sequence, posture and position from RW.  Distance ltd by onset dizziness/nausea   Information systems managertairs            Wheelchair Mobility    Modified Rankin (Stroke Patients Only)       Balance                                    Cognition Arousal/Alertness: Awake/alert Behavior During Therapy: WFL for tasks assessed/performed Overall Cognitive Status: Within Functional Limits for tasks assessed                      Exercises Total Joint Exercises Ankle Circles/Pumps: AROM;Both;15 reps;Supine Quad Sets: AROM;Both;10  reps;Supine Heel Slides: AAROM;Right;15 reps;Supine Straight Leg Raises: AAROM;Right;10 reps;Supine Goniometric ROM: AAROM at R knee -10 - 45    General Comments        Pertinent Vitals/Pain Pain Assessment: 0-10 Pain Score: 5  Pain Location: R knee Pain Descriptors / Indicators: Aching;Sore;Tightness Pain Intervention(s): Limited activity within patient's tolerance;Monitored during session;Premedicated before session;Ice applied    Home Living                      Prior Function            PT Goals (current goals can now be found in the care plan section) Acute Rehab PT Goals Patient Stated Goal: Resume previous lifestyle with decreased pain PT Goal Formulation: With patient Time For Goal Achievement: 05/31/15 Potential to Achieve Goals: Good Progress towards PT goals: Progressing toward goals    Frequency  7X/week    PT Plan Current plan remains appropriate    Co-evaluation             End of Session Equipment Utilized During Treatment: Gait belt;Right knee immobilizer Activity Tolerance: Patient tolerated treatment well Patient left: in chair;with call bell/phone within reach     Time: 0955-1021 PT Time Calculation (min) (ACUTE ONLY): 26 min  Charges:  $Gait Training: 8-22 mins $Therapeutic Exercise: 8-22 mins  G Codes:      Kaylee Davis 2015/06/10, 12:50 PM

## 2015-05-30 LAB — CBC
HEMATOCRIT: 33.7 % — AB (ref 36.0–46.0)
HEMOGLOBIN: 11.1 g/dL — AB (ref 12.0–15.0)
MCH: 30.3 pg (ref 26.0–34.0)
MCHC: 32.9 g/dL (ref 30.0–36.0)
MCV: 92.1 fL (ref 78.0–100.0)
Platelets: 282 10*3/uL (ref 150–400)
RBC: 3.66 MIL/uL — ABNORMAL LOW (ref 3.87–5.11)
RDW: 12.6 % (ref 11.5–15.5)
WBC: 13.5 10*3/uL — ABNORMAL HIGH (ref 4.0–10.5)

## 2015-05-30 LAB — BASIC METABOLIC PANEL
Anion gap: 11 (ref 5–15)
BUN: 14 mg/dL (ref 6–20)
CALCIUM: 8.8 mg/dL — AB (ref 8.9–10.3)
CHLORIDE: 106 mmol/L (ref 101–111)
CO2: 26 mmol/L (ref 22–32)
CREATININE: 0.51 mg/dL (ref 0.44–1.00)
GFR calc non Af Amer: >60 mL/min (ref >60)
GLUCOSE: 100 mg/dL — AB (ref 65–99)
Potassium: 3.3 mmol/L — ABNORMAL LOW (ref 3.5–5.1)
Sodium: 143 mmol/L (ref 135–145)

## 2015-05-30 NOTE — Progress Notes (Signed)
Physical Therapy Treatment Patient Details Name: Kaylee Davis MRN: [id number] DOB: 08/05/1940 Today's Date: 05/30/2015    History of Present Illness R TkR; s/p L TKR 2014    PT Comments    Pt progressing steadily with mobility.  Reviewed therex, don/doff KI and stairs.  Follow Up Recommendations  Home health PT     Equipment Recommendations  None recommended by PT    Recommendations for Other Services OT consult     Precautions / Restrictions Precautions Precautions: Knee;Fall Required Braces or Orthoses: Knee Immobilizer - Right Knee Immobilizer - Right: Discontinue once straight leg raise with < 10 degree lag Restrictions Weight Bearing Restrictions: No Other Position/Activity Restrictions: WBAT    Mobility  Bed Mobility               General bed mobility comments: Pt OOB and declines back to bed  Transfers Overall transfer level: Needs assistance Equipment used: Rolling walker (2 wheeled) Transfers: Sit to/from Stand Sit to Stand: Supervision         General transfer comment: cues for LE management and use of UEs to self assist  Ambulation/Gait Ambulation/Gait assistance: Min guard;Supervision Ambulation Distance (Feet): 68 Feet Assistive device: Rolling walker (2 wheeled) Gait Pattern/deviations: Step-to pattern;Decreased step length - right;Decreased step length - left;Shuffle;Trunk flexed Gait velocity: decr   General Gait Details: cues for sequence, posture and position from RW.  Distance ltd by onset dizziness/nausea   Stairs Stairs: Yes Stairs assistance: Min assist Stair Management: Two rails;Step to pattern;Forwards Number of Stairs: 2 General stair comments: cues for sequence and foot placement  Wheelchair Mobility    Modified Rankin (Stroke Patients Only)       Balance                                    Cognition Arousal/Alertness: Awake/alert Behavior During Therapy: WFL for tasks  assessed/performed Overall Cognitive Status: Within Functional Limits for tasks assessed                      Exercises Total Joint Exercises Ankle Circles/Pumps: AROM;Both;15 reps;Supine Quad Sets: AROM;Both;Supine;15 reps Heel Slides: AAROM;Right;Supine;20 reps Straight Leg Raises: AAROM;Right;Supine;20 reps Goniometric ROM: AAROM at R knee -10-  50    General Comments        Pertinent Vitals/Pain Pain Assessment: 0-10 Pain Score: 5  Pain Location: R knee Pain Descriptors / Indicators: Aching;Sore Pain Intervention(s): Limited activity within patient's tolerance;Monitored during session;Premedicated before session;Ice applied    Home Living                      Prior Function            PT Goals (current goals can now be found in the care plan section) Acute Rehab PT Goals Patient Stated Goal: Resume previous lifestyle with decreased pain PT Goal Formulation: With patient Time For Goal Achievement: 05/31/15 Potential to Achieve Goals: Good Progress towards PT goals: Progressing toward goals    Frequency  7X/week    PT Plan Current plan remains appropriate    Co-evaluation             End of Session Equipment Utilized During Treatment: Gait belt;Right knee immobilizer Activity Tolerance: Patient tolerated treatment well Patient left: in chair;with call bell/phone within reach     Time: 1045-1120 PT Time Calculation (min) (ACUTE ONLY): 35 min  Charges:  $Gait  Training: 8-22 mins $Therapeutic Exercise: 8-22 mins                    G Codes:      Khalon Cansler 05/30/2015, 12:37 PM

## 2015-05-30 NOTE — Progress Notes (Signed)
Discharged from floor via w/c, belongings & family with pt. No changes in assessment. Kaylee Davis  

## 2015-05-30 NOTE — Progress Notes (Signed)
   Subjective: 2 Days Post-Op Procedure(s) (LRB): RIGHT TOTAL KNEE ARTHROPLASTY (Right)  Pt doing well Walked yesterday with therapy Ready to d/c home Patient reports pain as none.  Objective:   VITALS:   Filed Vitals:   05/29/15 2207 05/30/15 0649  BP: 141/57 137/59  Pulse: 73 78  Temp: 98.8 F (37.1 C) 99.4 F (37.4 C)  Resp: 15 16    Right knee incision healing well Dressing changed nv intact distally No rashes or edema  LABS  Recent Labs  05/29/15 0444 05/30/15 0517  HGB 12.1 11.1*  HCT 36.9 33.7*  WBC 13.0* 13.5*  PLT 307 282     Recent Labs  05/29/15 0444 05/30/15 0517  NA 141 143  K 3.5 3.3*  BUN 9 14  CREATININE 0.52 0.51  GLUCOSE 118* 100*     Assessment/Plan: 2 Days Post-Op Procedure(s) (LRB): RIGHT TOTAL KNEE ARTHROPLASTY (Right) D/c home today F/u in 2 weeks     Alphonsa OverallBrad Banita Lehn, MPAS, PA-C  05/30/2015, 7:49 AM

## 2015-05-30 NOTE — Discharge Summary (Signed)
Physician Discharge Summary   Patient ID: Kaylee Davis MRN: [id number] DOB/AGE: [birth year] 75 y.o.  Admit date: 05/28/2015 Discharge date: 05/30/2015  Admission Diagnoses:  Principal Problem:   OA (osteoarthritis) of knee   Discharge Diagnoses:  Same   Surgeries: Procedure(s): RIGHT TOTAL KNEE ARTHROPLASTY on 05/28/2015   Consultants: PT/OT  Discharged Condition: Stable  Hospital Course: Kaylee Davis is an 75 y.o. female who was admitted 05/28/2015 with a chief complaint of No chief complaint on file. , and found to have a diagnosis of OA (osteoarthritis) of knee.  They were brought to the operating room on 05/28/2015 and underwent the above named procedures.    The patient had an uncomplicated hospital course and was stable for discharge.  Recent vital signs:  Filed Vitals:   05/29/15 2207 05/30/15 0649  BP: 141/57 137/59  Pulse: 73 78  Temp: 98.8 F (37.1 C) 99.4 F (37.4 C)  Resp: 15 16    Recent laboratory studies:  Results for orders placed or performed during the hospital encounter of 05/28/15  CBC  Result Value Ref Range   WBC 13.0 (H) 4.0 - 10.5 K/uL   RBC 3.97 3.87 - 5.11 MIL/uL   Hemoglobin 12.1 12.0 - 15.0 g/dL   HCT 14.7 82.9 - 56.2 %   MCV 92.9 78.0 - 100.0 fL   MCH 30.5 26.0 - 34.0 pg   MCHC 32.8 30.0 - 36.0 g/dL   RDW 13.0 86.5 - 78.4 %   Platelets 307 150 - 400 K/uL  Basic metabolic panel  Result Value Ref Range   Sodium 141 135 - 145 mmol/L   Potassium 3.5 3.5 - 5.1 mmol/L   Chloride 106 101 - 111 mmol/L   CO2 25 22 - 32 mmol/L   Glucose, Bld 118 (H) 65 - 99 mg/dL   BUN 9 6 - 20 mg/dL   Creatinine, Ser 6.96 0.44 - 1.00 mg/dL   Calcium 8.9 8.9 - 29.5 mg/dL   GFR calc non Af Amer >60 >60 mL/min   GFR calc Af Amer >60 >60 mL/min   Anion gap 10 5 - 15  CBC  Result Value Ref Range   WBC 13.5 (H) 4.0 - 10.5 K/uL   RBC 3.66 (L) 3.87 - 5.11 MIL/uL   Hemoglobin 11.1 (L) 12.0 - 15.0 g/dL   HCT 28.4 (L) 13.2 - 44.0 %   MCV 92.1 78.0  - 100.0 fL   MCH 30.3 26.0 - 34.0 pg   MCHC 32.9 30.0 - 36.0 g/dL   RDW 10.2 72.5 - 36.6 %   Platelets 282 150 - 400 K/uL  Basic metabolic panel  Result Value Ref Range   Sodium 143 135 - 145 mmol/L   Potassium 3.3 (L) 3.5 - 5.1 mmol/L   Chloride 106 101 - 111 mmol/L   CO2 26 22 - 32 mmol/L   Glucose, Bld 100 (H) 65 - 99 mg/dL   BUN 14 6 - 20 mg/dL   Creatinine, Ser 4.40 0.44 - 1.00 mg/dL   Calcium 8.8 (L) 8.9 - 10.3 mg/dL   GFR calc non Af Amer >60 >60 mL/min   GFR calc Af Amer >60 >60 mL/min   Anion gap 11 5 - 15    Discharge Medications:     Medication List    STOP taking these medications        meloxicam 15 MG tablet  Commonly known as:  MOBIC      TAKE these medications  amLODipine 10 MG tablet  Commonly known as:  NORVASC  TAKE 1 BY MOUTH EVERY MORNING     esomeprazole 20 MG capsule  Commonly known as:  NEXIUM  Take 1 capsule (20 mg total) by mouth every morning.     furosemide 40 MG tablet  Commonly known as:  LASIX  TAKE 1 BY MOUTH DAILY     methocarbamol 500 MG tablet  Commonly known as:  ROBAXIN  Take 1 tablet (500 mg total) by mouth every 6 (six) hours as needed for muscle spasms.     oxyCODONE 5 MG immediate release tablet  Commonly known as:  Oxy IR/ROXICODONE  Take 1-2 tablets (5-10 mg total) by mouth every 3 (three) hours as needed for moderate pain or severe pain.     rivaroxaban 10 MG Tabs tablet  Commonly known as:  XARELTO  Take 1 tablet (10 mg total) by mouth daily with breakfast. Take Xarelto for two and a half more weeks, then discontinue Xarelto. Once the patient has completed the blood thinner regimen, then take a Baby 81 mg Aspirin daily for three more weeks.     traMADol 50 MG tablet  Commonly known as:  ULTRAM  Take 1-2 tablets (50-100 mg total) by mouth every 6 (six) hours as needed (mild pain).        Diagnostic Studies: No results found.  Disposition: 06-Home-Health Care Svc      Discharge Instructions    Call  MD / Call 911    Complete by:  As directed   If you experience chest pain or shortness of breath, CALL 911 and be transported to the hospital emergency room.  If you develope a fever above 101 F, pus (white drainage) or increased drainage or redness at the wound, or calf pain, call your surgeon's office.     Constipation Prevention    Complete by:  As directed   Drink plenty of fluids.  Prune juice may be helpful.  You may use a stool softener, such as Colace (over the counter) 100 mg twice a day.  Use MiraLax (over the counter) for constipation as needed.     Diet - low sodium heart healthy    Complete by:  As directed      Increase activity slowly as tolerated    Complete by:  As directed            Follow-up Information    Follow up with Loanne DrillingALUISIO,FRANK V, MD. Schedule an appointment as soon as possible for a visit on 06/10/2015.   Specialty:  Orthopedic Surgery   Why:  Call [phone] Monday to make the appointment   Contact information:   [address] [place] [address] [phone]       [facility]   Why:  Home Health Physical Therapy   Contact information:   [address] [place] [address] [phone]        Signed: Thea GistDIXON,Karl Knarr B 05/30/2015, 8:18 AM

## 2015-05-31 ENCOUNTER — Telehealth: Payer: Self-pay | Admitting: *Deleted

## 2015-05-31 DIAGNOSIS — Z471 Aftercare following joint replacement surgery: Secondary | ICD-10-CM | POA: Diagnosis not present

## 2015-05-31 DIAGNOSIS — Z96653 Presence of artificial knee joint, bilateral: Secondary | ICD-10-CM | POA: Diagnosis not present

## 2015-05-31 DIAGNOSIS — I10 Essential (primary) hypertension: Secondary | ICD-10-CM | POA: Diagnosis not present

## 2015-05-31 NOTE — Telephone Encounter (Signed)
Pt was on TCM list admitted for OA. Had a (R) total knee replacement was d/c 05/30/15 will f/u with surgeon in 2 wks...Raechel Chute/lmb

## 2015-06-15 DIAGNOSIS — M1711 Unilateral primary osteoarthritis, right knee: Secondary | ICD-10-CM | POA: Diagnosis not present

## 2015-06-17 DIAGNOSIS — M1711 Unilateral primary osteoarthritis, right knee: Secondary | ICD-10-CM | POA: Diagnosis not present

## 2015-06-22 DIAGNOSIS — M1711 Unilateral primary osteoarthritis, right knee: Secondary | ICD-10-CM | POA: Diagnosis not present

## 2015-06-24 DIAGNOSIS — M1711 Unilateral primary osteoarthritis, right knee: Secondary | ICD-10-CM | POA: Diagnosis not present

## 2015-06-29 DIAGNOSIS — M1711 Unilateral primary osteoarthritis, right knee: Secondary | ICD-10-CM | POA: Diagnosis not present

## 2015-07-01 DIAGNOSIS — M1711 Unilateral primary osteoarthritis, right knee: Secondary | ICD-10-CM | POA: Diagnosis not present

## 2015-07-06 DIAGNOSIS — M1711 Unilateral primary osteoarthritis, right knee: Secondary | ICD-10-CM | POA: Diagnosis not present

## 2015-07-06 DIAGNOSIS — Z471 Aftercare following joint replacement surgery: Secondary | ICD-10-CM | POA: Diagnosis not present

## 2015-07-06 DIAGNOSIS — Z96651 Presence of right artificial knee joint: Secondary | ICD-10-CM | POA: Diagnosis not present

## 2015-07-08 DIAGNOSIS — M1711 Unilateral primary osteoarthritis, right knee: Secondary | ICD-10-CM | POA: Diagnosis not present

## 2015-07-12 DIAGNOSIS — M1711 Unilateral primary osteoarthritis, right knee: Secondary | ICD-10-CM | POA: Diagnosis not present

## 2015-07-16 DIAGNOSIS — M1711 Unilateral primary osteoarthritis, right knee: Secondary | ICD-10-CM | POA: Diagnosis not present

## 2015-07-19 DIAGNOSIS — M1711 Unilateral primary osteoarthritis, right knee: Secondary | ICD-10-CM | POA: Diagnosis not present

## 2015-07-23 DIAGNOSIS — M1711 Unilateral primary osteoarthritis, right knee: Secondary | ICD-10-CM | POA: Diagnosis not present

## 2015-09-13 ENCOUNTER — Encounter: Payer: Self-pay | Admitting: Family Medicine

## 2015-09-13 ENCOUNTER — Ambulatory Visit (INDEPENDENT_AMBULATORY_CARE_PROVIDER_SITE_OTHER): Payer: Medicare Other | Admitting: Family Medicine

## 2015-09-13 VITALS — BP 140/70 | HR 85 | Temp 98.1°F | Ht 66.5 in | Wt 163.0 lb

## 2015-09-13 DIAGNOSIS — N3090 Cystitis, unspecified without hematuria: Secondary | ICD-10-CM | POA: Diagnosis not present

## 2015-09-13 DIAGNOSIS — R300 Dysuria: Secondary | ICD-10-CM

## 2015-09-13 LAB — POCT URINALYSIS DIPSTICK
Glucose, UA: NEGATIVE
Ketones, UA: NEGATIVE
NITRITE UA: NEGATIVE
PH UA: 5
Protein, UA: POSITIVE
SPEC GRAV UA: 1.01
UROBILINOGEN UA: NEGATIVE

## 2015-09-13 MED ORDER — TRIMETHOPRIM 100 MG PO TABS: 100.0000 mg | ORAL_TABLET | Freq: Two times a day (BID) | ORAL | Status: DC

## 2015-09-13 NOTE — Progress Notes (Signed)
Subjective:    Patient ID: Kaylee Davis, female    DOB: Sep 21, 1940, 75 y.o.   MRN: [id number]  HPI  Kaylee Davis is a 75 year old female who presents today with urinary frequency. PMH of HTN, PSVT, osteoarthritis, GERD, and knee replacement surgery 3 months ago. Ambulating with a cane today. Symptoms started one day ago. Associated symptoms of urgency and frequency are present. She denies chills, fever, N/V/D, or flank pain are present today.  Denies recent antibiotic use. No treatments have been tried at home. No alleviating or aggravating factors noted.   Review of Systems  Constitutional: Negative for fever, chills and fatigue.  Respiratory: Negative for cough and shortness of breath.   Cardiovascular: Negative for chest pain, palpitations and leg swelling.  Gastrointestinal: Negative for nausea, vomiting, abdominal pain and diarrhea.  Genitourinary: Positive for dysuria, urgency and frequency. Negative for hematuria and vaginal discharge.  Skin: Negative for rash.  Neurological: Negative for light-headedness and headaches.   Past Medical History  Diagnosis Date  . Hypertension   . TMJ (temporomandibular joint disorder)     PAST HX GRINDING TEETH --BUT NO LONGER A PROBLEM  . Dyspepsia   . Migraine headache     much reduced frequency  . History of PSVT (paroxysmal supraventricular tachycardia)     can control by doing a valsalva. Triggered by elevated heart rate  . 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 GERD (gastroesophageal reflux disease)   . Arthritis     OA BOTH KNEES; OCCAS PAIN LEFT SHOULDER - HX OF ROTATOR CUFF PROBLEM  . Tachycardia     Episodic  . Wears glasses   . Tingling     left foot   . Imbalance   . History of frequent urinary tract infections   . History of blood transfusion     1990's     Social History   Social History  . Marital Status: Married    Spouse Name: N/A  . Number of Children: 2  .  Years of Education: 12   Occupational History  .      retired   Social History Main Topics  . Smoking status: Never Smoker   . Smokeless tobacco: Never Used  . Alcohol Use: No  . Drug Use: No  . Sexual Activity:    Partners: Male    Birth Control/ Protection: Surgical     Comment: TAH/BSO   Other Topics Concern  . Not on file   Social History Narrative   [employer], married 1958 2 sons 1961 and 1963, 1 grandson [id number]. End of life issues - materials provided for contemplation. Still in the contemplative.    Past Surgical History  Procedure Laterality Date  . Arthroplasty      left Knee   . Tonsillectomy    . Total knee arthroplasty Left 07/29/2012    Procedure: LEFT TOTAL KNEE ARTHROPLASTY;  Surgeon: Loanne DrillingFrank V Aluisio, MD;  Location: [facility];  Service: Orthopedics;  Laterality: Left;  . Laparoscopic appendectomy  02/2000  . Knee arthroscopy Left 10/2005  . Wisdom tooth extraction  2008  . Abdominal hysterectomy  1985    TAH/BSO--for ?fibroids  . Appendectomy    . Cyst removed       from mouth  . Total knee arthroplasty Right 05/28/2015    Procedure: RIGHT TOTAL KNEE ARTHROPLASTY;  Surgeon: Ollen GrossFrank Aluisio, MD;  Location: [facility];  Service:  Orthopedics;  Laterality: Right;    Family History  Problem Relation Age of Onset  . Cancer Neg Hx     Breast/Colon  . Diabetes Neg Hx   . Heart disease Neg Hx     CAD/MI    Allergies  Allergen Reactions  . Other     PT STATES THE SULFA AND PENICILLIN SHE TOOK MANY YEARS AGO BOTH HAD RED DYES--SO SHE WONDERED IF ALLERGIC TO RED DYES  . Penicillins Hives    Has had penicillin since and has been fine.    . Sulfonamide Derivatives Hives    Current Outpatient Prescriptions on File Prior to Visit  Medication Sig Dispense Refill  . amLODipine (NORVASC) 10 MG tablet TAKE 1 BY MOUTH EVERY MORNING 90 tablet 3  . esomeprazole (NEXIUM) 20 MG capsule Take 1 capsule (20 mg total) by mouth every morning. 90 capsule 3  . furosemide  (LASIX) 40 MG tablet TAKE 1 BY MOUTH DAILY 90 tablet 3   No current facility-administered medications on file prior to visit.    BP 140/70 mmHg  Pulse 85  Temp(Src) 98.1 F (36.7 C) (Oral)  Ht 5' 6.5" (1.689 m)  Wt 163 lb (73.936 kg)  BMI 25.92 kg/m2  SpO2 97%  LMP 03/21/1983        Objective:   Physical Exam  Constitutional: She is oriented to person, place, and time. She appears well-developed and well-nourished.  Cardiovascular: Normal rate and regular rhythm.   Pulmonary/Chest: Effort normal and breath sounds normal. She has no wheezes.  Abdominal: Soft. Bowel sounds are normal. There is no tenderness. There is no CVA tenderness.  Suprapubic tenderness noted  Neurological: She is alert and oriented to person, place, and time.  Skin: Skin is warm and dry. No rash noted.        Assessment & Plan:  1. Cystitis In office urinalysis positive for leukocytes and blood.  Negative for nitrites today.  Symptoms and exam are consistent with uncomplicated URI.  Urine will be sent for a culture.  Start trimethoprim today.    - trimethoprim (TRIMPEX) 100 MG tablet; Take 1 tablet (100 mg total) by mouth 2 (two) times daily.  Dispense: 20 tablet; Refill: 0  2. Dysuria  - POCT Urinalysis Dipstick - CULTURE, URINE COMPREHENSIVE  Advised patient to drink plenty of water so that her urine is pale yellow or clear and follow up with PCP if symptoms do not improve, worsen, or she develops a fever >101, or flank pain. Also advised patient that she will be contacted with urine culture results and if she has any adverse reactions to this medication, she should stop and contact clinic for further evaluation and treatment. Patient voiced understanding and agreed with plan.  Roddie McJulia Acheron Sugg, FNP-C

## 2015-09-13 NOTE — Patient Instructions (Signed)
Please take medication as directed and follow up if symptoms do not improve in 2 to 3 days, worsen, or you develop a fever >101 or back pain.  Urinary Tract Infection Urinary tract infections (UTIs) can develop anywhere along your urinary tract. Your urinary tract is your body's drainage system for removing wastes and extra water. Your urinary tract includes two kidneys, two ureters, a bladder, and a urethra. Your kidneys are a pair of bean-shaped organs. Each kidney is about the size of your fist. They are located below your ribs, one on each side of your spine. CAUSES Infections are caused by microbes, which are microscopic organisms, including fungi, viruses, and bacteria. These organisms are so small that they can only be seen through a microscope. Bacteria are the microbes that most commonly cause UTIs. SYMPTOMS  Symptoms of UTIs may vary by age and gender of the patient and by the location of the infection. Symptoms in young women typically include a frequent and intense urge to urinate and a painful, burning feeling in the bladder or urethra during urination. Older women and men are more likely to be tired, shaky, and weak and have muscle aches and abdominal pain. A fever may mean the infection is in your kidneys. Other symptoms of a kidney infection include pain in your back or sides below the ribs, nausea, and vomiting. DIAGNOSIS To diagnose a UTI, your caregiver will ask you about your symptoms. Your caregiver will also ask you to provide a urine sample. The urine sample will be tested for bacteria and white blood cells. White blood cells are made by your body to help fight infection. TREATMENT  Typically, UTIs can be treated with medication. Because most UTIs are caused by a bacterial infection, they usually can be treated with the use of antibiotics. The choice of antibiotic and length of treatment depend on your symptoms and the type of bacteria causing your infection. HOME CARE  INSTRUCTIONS  If you were prescribed antibiotics, take them exactly as your caregiver instructs you. Finish the medication even if you feel better after you have only taken some of the medication.  Drink enough water and fluids to keep your urine clear or pale yellow.  Avoid caffeine, tea, and carbonated beverages. They tend to irritate your bladder.  Empty your bladder often. Avoid holding urine for long periods of time.  Empty your bladder before and after sexual intercourse.  After a bowel movement, women should cleanse from front to back. Use each tissue only once. SEEK MEDICAL CARE IF:   You have back pain.  You develop a fever.  Your symptoms do not begin to resolve within 3 days. SEEK IMMEDIATE MEDICAL CARE IF:   You have severe back pain or lower abdominal pain.  You develop chills.  You have nausea or vomiting.  You have continued burning or discomfort with urination. MAKE SURE YOU:   Understand these instructions.  Will watch your condition.  Will get help right away if you are not doing well or get worse.   This information is not intended to replace advice given to you by your health care provider. Make sure you discuss any questions you have with your health care provider.   Document Released: 12/14/2004 Document Revised: 11/25/2014 Document Reviewed: 04/14/2011 Elsevier Interactive Patient Education [employer] Inc.

## 2015-09-13 NOTE — Progress Notes (Signed)
Pre visit review using our clinic review tool, if applicable. No additional management support is needed unless otherwise documented below in the visit note. 

## 2015-09-16 LAB — URINE CULTURE: Colony Count: >=100000

## 2015-09-20 ENCOUNTER — Telehealth: Payer: Self-pay | Admitting: Internal Medicine

## 2015-09-20 NOTE — Telephone Encounter (Signed)
Pt would like urine results. Pt saw julie on 09-13-15

## 2015-09-22 NOTE — Telephone Encounter (Signed)
Patient is aware 

## 2015-10-11 ENCOUNTER — Ambulatory Visit (INDEPENDENT_AMBULATORY_CARE_PROVIDER_SITE_OTHER): Payer: Medicare Other

## 2015-10-11 VITALS — BP 136/80 | Ht 67.0 in | Wt 163.0 lb

## 2015-10-11 DIAGNOSIS — Z0000 Encounter for general adult medical examination without abnormal findings: Secondary | ICD-10-CM

## 2015-10-11 NOTE — Progress Notes (Signed)
Medical screening examination/treatment/procedure(s) were performed by non-physician practitioner and as supervising physician I was immediately available for consultation/collaboration. I agree with above. Valdez Brannan A Namir Neto, MD 

## 2015-10-11 NOTE — Progress Notes (Signed)
Subjective:   Kaylee Davis is a 75 y.o. female who presents for Medicare Annual (Subsequent) preventive examination.  Review of Systems:  HRA assessment completed during this visit with Ms Bent  The Patient was informed that the wellness visit is to identify future health risk and educate and initiate measures that can reduce risk for increased disease through the lifespan.    NO ROS; Medicare Wellness Visit Last OV:  recent visit for cystitis; Seen Dr. Okey Dupre in march  Labs completed: 07/2014 chol 219; trig 245; HDL 57; LDL 123 (A1c 5.4) May discuss fish oil with Dr. 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 Still enjoying their life.   Tobacco: no or smoking cessation discussed: Never smoked   How many drinks do you have per week? 0  Medications: taking nexium over the counter / states insurance would not pay for nexium  BMI: 25   Diet;   Eats good diet;  Main food at Breakfast and lunch Breakfast; has bacon and eggs; pastry; boiled eggs Lunch; eat out a lot; chicken; salad and vegetables; Eats A lot of fruit Dinner light; sandwich or other   Nutritional counseling given: eats well; reflux issues so eats more during the day and less at night which helps the reflux Teeth or Denture issues? q 6 months   Exercise;   Walks at the park with spouse; walk every day;  Getting exercise every day;    HOME SAFETY; 2 level home; has lift to second floor during surgeries Knees hurting x 75 yo  Fall hx; no  Fall risk: Fear of falling? Some fear of falling remains post surgery  Free standing shower   Given education on "Fall Prevention in the Home" for more  safety tips the patient can apply as appropriate.  Long term goal is to  Age in place  Safety features reviewed for safe community;alarm system;  firearms if in the home; smoke alarms; sun protection when outside (Yes) ; driving difficulties or accidents; no  Mental Health:  Any emotional problems? Anxious, depressed, irritable, sad or blue?  no Denies feeling depressed or hopeless; voices pleasure in daily life How many social activities have you been engaged in within the last 2 weeks? No    Pain: no pain  Cognitive; no issues / Manages checkbook, medications; no failures of task Ad8 score reviewed for issues;  Issues making decisions; no  Less interest in hobbies / activities" no  Repeats questions, stories; family complaining: NO  Trouble using ordinary gadgets; microwave; computer: no  Forgets the month or year: no  Mismanaging finances: no  Missing apt: no but does write them down  Daily problems with thinking of memory NO Ad8 score is 0   Any dizziness when standing up?   Mobilization and Functional losses from last year to this year? No; but still walking cautiously due to knee surgery on the right; States she is good; ROM excellent; just very cautious.   Sleep pattern changes; no  Urinary or fecal incontinence reviewed: no  Advanced Directive addressed; Completed or educated   Counseling Health Maintenance Gaps:  Colonoscopy; has not had one/ dicsussed cologuard  EKG: 05/2015 ? SVT  Mammogram: 04/2014/ had had every year; but now every other year /  Will pay for every year; patient choice  Dexa/ per gyn note; completed at [facility] in 2013 and was normal / will call and get copy  PAP: educated regarding the need for GYN exam; 02/2009 Status post TAH/BSO.  Hearing: better in left;  Audiology test several years ago Does not feel she has an issue now   Ophthalmology exam see Dr. Joseph Art; apt scheduled  Glaucoma screening no   Immunizations Due: (Vaccines  reviewed and educated regarding any overdue)    Barriers to Success None noted;   Current Care Team reviewed and updated Dr. Lynder Parents Dr. Lequita Halt; recent Knee surgery  Education provided and lifestyle risk discussed   All Health Maintenance Gaps Reviewed for closure   Cardiac Risk Factors include: advanced age (>23men, >56 women);family history of premature cardiovascular disease;hypertension    Objective:     Vitals: BP 136/80   Ht  (1.702 m)   Wt 163 lb (73.9 kg)   LMP 03/21/1983   BMI 25.53 kg/m   Body mass index is 25.53 kg/m.   Tobacco History  Smoking Status  . Never Smoker  Smokeless Tobacco  . Never Used     Counseling given: Yes   Past Medical History:  Diagnosis Date  . Arthritis    OA BOTH KNEES; OCCAS PAIN LEFT SHOULDER - HX OF ROTATOR CUFF PROBLEM  . Dyspepsia   . GERD (gastroesophageal reflux disease)   . GI bleed    GI BLEED FROM DUODENAL ULCER--ABOUT 17 YRS AGO - GIVEN TRANSFUSIONS  . History of blood transfusion    1990's  . History of frequent urinary tract infections   . History of PSVT (paroxysmal supraventricular tachycardia)    can control by doing a valsalva. Triggered by elevated heart rate  . Hypertension   . Imbalance   . Migraine headache    much reduced frequency  . Tachycardia    Episodic  . Tingling    left foot   . TMJ (temporomandibular joint disorder)    PAST HX GRINDING TEETH --BUT NO LONGER A PROBLEM  . Wears glasses    Past Surgical History:  Procedure Laterality Date  . ABDOMINAL HYSTERECTOMY  1985   TAH/BSO--for ?fibroids  . APPENDECTOMY    . ARTHROPLASTY     left Knee   . cyst removed      from mouth  . KNEE ARTHROSCOPY Left 10/2005  . LAPAROSCOPIC APPENDECTOMY  02/2000  . TONSILLECTOMY    . 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 TOTAL KNEE ARTHROPLASTY Right  05/28/2015   Procedure: RIGHT TOTAL KNEE ARTHROPLASTY;  Surgeon: Ollen Gross, MD;  Location: [facility];  Service: Orthopedics;  Laterality: Right;  . WISDOM TOOTH EXTRACTION  2008   Family History  Problem Relation Age of Onset  . Cancer Neg Hx     Breast/Colon  . Diabetes Neg Hx   . Heart disease Neg Hx     CAD/MI   History  Sexual Activity  . Sexual activity: Not Currently  . Partners: Male  . Birth control/ protection: Surgical    Comment: TAH/BSO    Outpatient Encounter Prescriptions as of 10/11/2015  Medication Sig  . amLODipine (NORVASC) 10 MG tablet TAKE 1 BY MOUTH EVERY MORNING  . furosemide (LASIX)  40 MG tablet TAKE 1 BY MOUTH DAILY  . meloxicam (MOBIC) 7.5 MG tablet Take 15 mg by mouth daily.  Marland Kitchen esomeprazole (NEXIUM) 20 MG capsule Take 1 capsule (20 mg total) by mouth every morning. (Patient not taking: Reported on 10/11/2015)  . [DISCONTINUED] trimethoprim (TRIMPEX) 100 MG tablet Take 1 tablet (100 mg total) by mouth 2 (two) times daily.   No facility-administered encounter medications on file as of 10/11/2015.     Activities of Daily Living In your present state of health, do you have any difficulty performing the following activities: 10/11/2015 05/28/2015  Hearing? N N  Vision? N N  Difficulty concentrating or making decisions? N N  Walking or climbing stairs? N Y  Dressing or bathing? N N  Doing errands, shopping? N Y  Quarry manager and eating ? N -  Using the Toilet? N -  In the past six months, have you accidently leaked urine? N -  Do you have problems with loss of bowel control? N -  Managing your Medications? N -  Managing your Finances? N -  Housekeeping or managing your Housekeeping? N -  Some recent data might be hidden    Patient Care Team: Myrlene Broker, MD as PCP - General (Internal Medicine) Gladys Damme (Gynecology) Ollen Gross, MD (Orthopedic Surgery)    Assessment:      Exercise Activities and Dietary  recommendations Current Exercise Habits: Home exercise routine, Type of exercise: walking, Time (Minutes): 30, Frequency (Times/Week): 6, Weekly Exercise (Minutes/Week): 180, Intensity: Moderate  Goals    . Exercise 150 minutes per week (moderate activity)          Continue to exercise and walk; every day if possible       Fall Risk Fall Risk  10/11/2015 05/20/2015  Falls in the past year? No No   Depression Screen PHQ 2/9 Scores 10/11/2015 05/20/2015  PHQ - 2 Score 0 0     Cognitive Testing No flowsheet data found. Ad8 score 0; has no issues   Immunization History  Administered Date(s) Administered  . Influenza Split 01/02/2011, 01/04/2012  . Influenza Whole 12/11/2008, 12/13/2009  . Influenza, High Dose Seasonal PF 01/15/2015  . Influenza,inj,Quad PF,36+ Mos 12/17/2012, 01/12/2014  . Pneumococcal Conjugate-13 02/05/2013  . Pneumococcal Polysaccharide-23 12/06/2006  . Td 12/11/2008  . Zoster 03/21/2005   Screening Tests Health Maintenance  Topic Date Due  . DEXA SCAN  06/02/2003  . INFLUENZA VACCINE  10/19/2015  . TETANUS/TDAP  12/12/2018  . ZOSTAVAX  Completed  . PNA vac Low Risk Adult  Completed      Plan:   Reviewed Triglycerides and given information on her numbers. Stated she would eat less ice cream;   BP back down 136/ 80 checks at home. Doing well; Still cautious post TKR in March; great ROM  DEXA due but GYN note states she had one 2013; per [facility]; 2011; to be faxed    Given education  During the course of the visit the patient was educated and counseled about the following appropriate screening and preventive services:   Vaccines to include Pneumoccal, Influenza, Hepatitis B, Td, Zostavax, HCV  Electrocardiogram/ Occasionally tachy rate corrected by maneuver shew as taught in cardiology   Cardiovascular Disease ; check BP at home  Colorectal cancer screening/ declines; mentioned colo guard to consider   Bone density screening/ GYN note reported  dexa normal 2013; Call to [facility] and stated her last bone density was 2011; They are faxing the result  Diabetes screening/neg  Glaucoma screening/ eyes to be checked soon  Mammography/ completed 04/2014  Nutrition counseling / nutrition good;educated regarding Triglycerdies  Patient Instructions (the written plan) was given to the patient.   Montine Circle, RN  10/11/2015

## 2015-10-11 NOTE — Patient Instructions (Addendum)
Kaylee Davis , Thank you for taking time to come for your Medicare Wellness Visit. I appreciate your ongoing commitment to your health goals. Please review the following plan we discussed and let me know if I can assist you in the future.   I will get your last dexa scan from [facility] 2013    These are the goals we discussed: Goals    . Exercise 150 minutes per week (moderate activity)          Continue to exercise and walk; every day if possible        This is a list of the screening recommended for you and due dates:  Health Maintenance  Topic Date Due  . DEXA scan (bone density measurement)  06/02/2003  . Flu Shot  10/19/2015  . Tetanus Vaccine  12/12/2018  . Shingles Vaccine  Completed  . Pneumonia vaccines  Completed   Food Choices to Lower Your Triglycerides Triglycerides are a type of fat in your blood. High levels of triglycerides can increase the risk of heart disease and stroke. If your triglyceride levels are high, the foods you eat and your eating habits are very important. Choosing the right foods can help lower your triglycerides.  WHAT GENERAL GUIDELINES DO I NEED TO FOLLOW?  Lose weight if you are overweight.   Limit or avoid alcohol.   Fill one half of your plate with vegetables and green salads.   Limit fruit to two servings a day. Choose fruit instead of juice.   Make one fourth of your plate whole grains. Look for the word "whole" as the first word in the ingredient list.  Fill one fourth of your plate with lean protein foods.  Enjoy fatty fish (such as salmon, mackerel, sardines, and tuna) three times a week.   Choose healthy fats.   Limit foods high in starch and sugar.  Eat more home-cooked food and less restaurant, buffet, and fast food.  Limit fried foods.  Cook foods using methods other than frying.  Limit saturated fats.  Check ingredient lists to avoid foods with partially hydrogenated oils (trans fats) in them. WHAT FOODS CAN I  EAT?  Grains Whole grains, such as whole wheat or whole grain breads, crackers, cereals, and pasta. Unsweetened oatmeal, bulgur, barley, quinoa, or brown rice. Corn or whole wheat flour tortillas.  Vegetables Fresh or frozen vegetables (raw, steamed, roasted, or grilled). Green salads. Fruits All fresh, canned (in natural juice), or frozen fruits. Meat and Other Protein Products Ground beef (85% or leaner), grass-fed beef, or beef trimmed of fat. Skinless chicken or [place]. Ground chicken or [place]. Pork trimmed of fat. All fish and seafood. Eggs. Dried beans, peas, or lentils. Unsalted nuts or seeds. Unsalted canned or dry beans. Dairy Low-fat dairy products, such as skim or 1% milk, 2% or reduced-fat cheeses, low-fat ricotta or cottage cheese, or plain low-fat yogurt. Fats and Oils Tub margarines without trans fats. Light or reduced-fat mayonnaise and salad dressings. Avocado. Safflower, olive, or canola oils. Natural peanut or almond butter. The items listed above may not be a complete list of recommended foods or beverages. Contact your dietitian for more options. WHAT FOODS ARE NOT RECOMMENDED?  Grains White bread. White pasta. White rice. Cornbread. Bagels, pastries, and croissants. Crackers that contain trans fat. Vegetables White potatoes. Corn. Creamed or fried vegetables. Vegetables in a cheese sauce. Fruits Dried fruits. Canned fruit in light or heavy syrup. Fruit juice. Meat and Other Protein Products Fatty cuts of meat. Ribs,  chicken wings, bacon, sausage, bologna, salami, chitterlings, fatback, hot dogs, bratwurst, and packaged luncheon meats. Dairy Whole or 2% milk, cream, half-and-half, and cream cheese. Whole-fat or sweetened yogurt. Full-fat cheeses. Nondairy creamers and whipped toppings. Processed cheese, cheese spreads, or cheese curds. Sweets and Desserts Corn syrup, sugars, honey, and molasses. Candy. Jam and jelly. Syrup. Sweetened cereals. Cookies, pies, cakes,  donuts, muffins, and ice cream. Fats and Oils Butter, stick margarine, lard, shortening, ghee, or bacon fat. Coconut, palm kernel, or palm oils. Beverages Alcohol. Sweetened drinks (such as sodas, lemonade, and fruit drinks or punches). The items listed above may not be a complete list of foods and beverages to avoid. Contact your dietitian for more information.   This information is not intended to replace advice given to you by your health care provider. Make sure you discuss any questions you have with your health care provider.   Document Released: 12/23/2003 Document Revised: 03/27/2014 Document Reviewed: 01/08/2013 Elsevier Interactive Patient Education 2016 [place] in the Home  Falls can cause injuries. They can happen to people of all ages. There are many things you can do to make your home safe and to help prevent falls.  WHAT CAN I DO ON THE OUTSIDE OF MY HOME?  Regularly fix the edges of walkways and driveways and fix any cracks.  Remove anything that might make you trip as you walk through a door, such as a raised step or threshold.  Trim any bushes or trees on the path to your home.  Use bright outdoor lighting.  Clear any walking paths of anything that might make someone trip, such as rocks or tools.  Regularly check to see if handrails are loose or broken. Make sure that both sides of any steps have handrails.  Any raised decks and porches should have guardrails on the edges.  Have any leaves, snow, or ice cleared regularly.  Use sand or salt on walking paths during winter.  Clean up any spills in your garage right away. This includes oil or grease spills. WHAT CAN I DO IN THE BATHROOM?   Use night lights.  Install grab bars by the toilet and in the tub and shower. Do not use towel bars as grab bars.  Use non-skid mats or decals in the tub or shower.  If you need to sit down in the shower, use a plastic, non-slip stool.  Keep  the floor dry. Clean up any water that spills on the floor as soon as it happens.  Remove soap buildup in the tub or shower regularly.  Attach bath mats securely with double-sided non-slip rug tape.  Do not have throw rugs and other things on the floor that can make you trip. WHAT CAN I DO IN THE BEDROOM?  Use night lights.  Make sure that you have a light by your bed that is easy to reach.  Do not use any sheets or blankets that are too big for your bed. They should not hang down onto the floor.  Have a firm chair that has side arms. You can use this for support while you get dressed.  Do not have throw rugs and other things on the floor that can make you trip. WHAT CAN I DO IN THE KITCHEN?  Clean up any spills right away.  Avoid walking on wet floors.  Keep items that you use a lot in easy-to-reach places.  If you need to reach something above you, use a  strong step stool that has a grab bar.  Keep electrical cords out of the way.  Do not use floor polish or wax that makes floors slippery. If you must use wax, use non-skid floor wax.  Do not have throw rugs and other things on the floor that can make you trip. WHAT CAN I DO WITH MY STAIRS?  Do not leave any items on the stairs.  Make sure that there are handrails on both sides of the stairs and use them. Fix handrails that are broken or loose. Make sure that handrails are as long as the stairways.  Check any carpeting to make sure that it is firmly attached to the stairs. Fix any carpet that is loose or worn.  Avoid having throw rugs at the top or bottom of the stairs. If you do have throw rugs, attach them to the floor with carpet tape.  Make sure that you have a light switch at the top of the stairs and the bottom of the stairs. If you do not have them, ask someone to add them for you. WHAT ELSE CAN I DO TO HELP PREVENT FALLS?  Wear shoes that:  Do not have high heels.  Have rubber bottoms.  Are comfortable and  fit you well.  Are closed at the toe. Do not wear sandals.  If you use a stepladder:  Make sure that it is fully opened. Do not climb a closed stepladder.  Make sure that both sides of the stepladder are locked into place.  Ask someone to hold it for you, if possible.  Clearly mark and make sure that you can see:  Any grab bars or handrails.  First and last steps.  Where the edge of each step is.  Use tools that help you move around (mobility aids) if they are needed. These include:  Canes.  Walkers.  Scooters.  Crutches.  Turn on the lights when you go into a dark area. Replace any light bulbs as soon as they burn out.  Set up your furniture so you have a clear path. Avoid moving your furniture around.  If any of your floors are uneven, fix them.  If there are any pets around you, be aware of where they are.  Review your medicines with your doctor. Some medicines can make you feel dizzy. This can increase your chance of falling. Ask your doctor what other things that you can do to help prevent falls.   This information is not intended to replace advice given to you by your health care provider. Make sure you discuss any questions you have with your health care provider.   Document Released: 12/31/2008 Document Revised: 07/21/2014 Document Reviewed: 04/10/2014 Elsevier Interactive Patient Education 2016 [place] Maintenance, Female Adopting a healthy lifestyle and getting preventive care can go a long way to promote health and wellness. Talk with your health care provider about what schedule of regular examinations is right for you. This is a good chance for you to check in with your provider about disease prevention and staying healthy. In between checkups, there are plenty of things you can do on your own. Experts have done a lot of research about which lifestyle changes and preventive measures are most likely to keep you healthy. Ask your health care  provider for more information. WEIGHT AND DIET  Eat a healthy diet  Be sure to include plenty of vegetables, fruits, low-fat dairy products, and lean protein.  Do not eat a lot  of foods high in solid fats, added sugars, or salt.  Get regular exercise. This is one of the most important things you can do for your health.  Most adults should exercise for at least 150 minutes each week. The exercise should increase your heart rate and make you sweat (moderate-intensity exercise).  Most adults should also do strengthening exercises at least twice a week. This is in addition to the moderate-intensity exercise.  Maintain a healthy weight  Body mass index (BMI) is a measurement that can be used to identify possible weight problems. It estimates body fat based on height and weight. Your health care provider can help determine your BMI and help you achieve or maintain a healthy weight.  For females 66 years of age and older:   A BMI below 18.5 is considered underweight.  A BMI of 18.5 to 24.9 is normal.  A BMI of 25 to 29.9 is considered overweight.  A BMI of 30 and above is considered obese.  Watch levels of cholesterol and blood lipids  You should start having your blood tested for lipids and cholesterol at 77 years of age, then have this test every 5 years.  You may need to have your cholesterol levels checked more often if:  Your lipid or cholesterol levels are high.  You are older than 77 years of age.  You are at high risk for heart disease.  CANCER SCREENING   Lung Cancer  Lung cancer screening is recommended for adults 62-7 years old who are at high risk for lung cancer because of a history of smoking.  A yearly low-dose CT scan of the lungs is recommended for people who:  Currently smoke.  Have quit within the past 15 years.  Have at least a 30-pack-year history of smoking. A pack year is smoking an average of one pack of cigarettes a day for 1 year.  Yearly  screening should continue until it has been 15 years since you quit.  Yearly screening should stop if you develop a health problem that would prevent you from having lung cancer treatment.  Breast Cancer  Practice breast self-awareness. This means understanding how your breasts normally appear and feel.  It also means doing regular breast self-exams. Let your health care provider know about any changes, no matter how small.  If you are in your 20s or 30s, you should have a clinical breast exam (CBE) by a health care provider every 1-3 years as part of a regular health exam.  If you are 75 or older, have a CBE every year. Also consider having a breast X-ray (mammogram) every year.  If you have a family history of breast cancer, talk to your health care provider about genetic screening.  If you are at high risk for breast cancer, talk to your health care provider about having an MRI and a mammogram every year.  Breast cancer gene (BRCA) assessment is recommended for women who have family members with BRCA-related cancers. BRCA-related cancers include:  Breast.  Ovarian.  Tubal.  Peritoneal cancers.  Results of the assessment will determine the need for genetic counseling and BRCA1 and BRCA2 testing. Cervical Cancer Your health care provider may recommend that you be screened regularly for cancer of the pelvic organs (ovaries, uterus, and vagina). This screening involves a pelvic examination, including checking for microscopic changes to the surface of your cervix (Pap test). You may be encouraged to have this screening done every 3 years, beginning at age 47.  For  women ages 8-65, health care providers may recommend pelvic exams and Pap testing every 3 years, or they may recommend the Pap and pelvic exam, combined with testing for human papilloma virus (HPV), every 5 years. Some types of HPV increase your risk of cervical cancer. Testing for HPV may also be done on women of any age  with unclear Pap test results.  Other health care providers may not recommend any screening for nonpregnant women who are considered low risk for pelvic cancer and who do not have symptoms. Ask your health care provider if a screening pelvic exam is right for you.  If you have had past treatment for cervical cancer or a condition that could lead to cancer, you need Pap tests and screening for cancer for at least 20 years after your treatment. If Pap tests have been discontinued, your risk factors (such as having a new sexual partner) need to be reassessed to determine if screening should resume. Some women have medical problems that increase the chance of getting cervical cancer. In these cases, your health care provider may recommend more frequent screening and Pap tests. Colorectal Cancer  This type of cancer can be detected and often prevented.  Routine colorectal cancer screening usually begins at 77 years of age and continues through 77 years of age.  Your health care provider may recommend screening at an earlier age if you have risk factors for colon cancer.  Your health care provider may also recommend using home test kits to check for hidden blood in the stool.  A small camera at the end of a tube can be used to examine your colon directly (sigmoidoscopy or colonoscopy). This is done to check for the earliest forms of colorectal cancer.  Routine screening usually begins at age 74.  Direct examination of the colon should be repeated every 5-10 years through 77 years of age. However, you may need to be screened more often if early forms of precancerous polyps or small growths are found. Skin Cancer  Check your skin from head to toe regularly.  Tell your health care provider about any new moles or changes in moles, especially if there is a change in a mole's shape or color.  Also tell your health care provider if you have a mole that is larger than the size of a pencil  eraser.  Always use sunscreen. Apply sunscreen liberally and repeatedly throughout the day.  Protect yourself by wearing long sleeves, pants, a wide-brimmed hat, and sunglasses whenever you are outside. HEART DISEASE, DIABETES, AND HIGH BLOOD PRESSURE   High blood pressure causes heart disease and increases the risk of stroke. High blood pressure is more likely to develop in:  People who have blood pressure in the high end of the normal range (130-139/85-89 mm Hg).  People who are overweight or obese.  People who are African American.  If you are 39-16 years of age, have your blood pressure checked every 3-5 years. If you are 34 years of age or older, have your blood pressure checked every year. You should have your blood pressure measured twice--once when you are at a hospital or clinic, and once when you are not at a hospital or clinic. Record the average of the two measurements. To check your blood pressure when you are not at a hospital or clinic, you can use:  An automated blood pressure machine at a pharmacy.  A home blood pressure monitor.  If you are between 55 years and 79 years  old, ask your health care provider if you should take aspirin to prevent strokes.  Have regular diabetes screenings. This involves taking a blood sample to check your fasting blood sugar level.  If you are at a normal weight and have a low risk for diabetes, have this test once every three years after 77 years of age.  If you are overweight and have a high risk for diabetes, consider being tested at a younger age or more often. PREVENTING INFECTION  Hepatitis B  If you have a higher risk for hepatitis B, you should be screened for this virus. You are considered at high risk for hepatitis B if:  You were born in a country where hepatitis B is common. Ask your health care provider which countries are considered high risk.  Your parents were born in a high-risk country, and you have not been immunized  against hepatitis B (hepatitis B vaccine).  You have HIV or AIDS.  You use needles to inject street drugs.  You live with someone who has hepatitis B.  You have had sex with someone who has hepatitis B.  You get hemodialysis treatment.  You take certain medicines for conditions, including cancer, organ transplantation, and autoimmune conditions. Hepatitis C  Blood testing is recommended for:  Everyone born from [birth year] through 1967.  Anyone with known risk factors for hepatitis C. Sexually transmitted infections (STIs)  You should be screened for sexually transmitted infections (STIs) including gonorrhea and chlamydia if:  You are sexually active and are younger than 77 years of age.  You are older than 77 years of age and your health care provider tells you that you are at risk for this type of infection.  Your sexual activity has changed since you were last screened and you are at an increased risk for chlamydia or gonorrhea. Ask your health care provider if you are at risk.  If you do not have HIV, but are at risk, it may be recommended that you take a prescription medicine daily to prevent HIV infection. This is called pre-exposure prophylaxis (PrEP). You are considered at risk if:  You are sexually active and do not regularly use condoms or know the HIV status of your partner(s).  You take drugs by injection.  You are sexually active with a partner who has HIV. Talk with your health care provider about whether you are at high risk of being infected with HIV. If you choose to begin PrEP, you should first be tested for HIV. You should then be tested every 3 months for as long as you are taking PrEP.  PREGNANCY   If you are premenopausal and you may become pregnant, ask your health care provider about preconception counseling.  If you may become pregnant, take 400 to 800 micrograms (mcg) of folic acid every day.  If you want to prevent pregnancy, talk to your health care  provider about birth control (contraception). OSTEOPOROSIS AND MENOPAUSE   Osteoporosis is a disease in which the bones lose minerals and strength with aging. This can result in serious bone fractures. Your risk for osteoporosis can be identified using a bone density scan.  If you are 56 years of age or older, or if you are at risk for osteoporosis and fractures, ask your health care provider if you should be screened.  Ask your health care provider whether you should take a calcium or vitamin D supplement to lower your risk for osteoporosis.  Menopause may have certain physical symptoms and  risks.  Hormone replacement therapy may reduce some of these symptoms and risks. Talk to your health care provider about whether hormone replacement therapy is right for you.  HOME CARE INSTRUCTIONS   Schedule regular health, dental, and eye exams.  Stay current with your immunizations.   Do not use any tobacco products including cigarettes, chewing tobacco, or electronic cigarettes.  If you are pregnant, do not drink alcohol.  If you are breastfeeding, limit how much and how often you drink alcohol.  Limit alcohol intake to no more than 1 drink per day for nonpregnant women. One drink equals 12 ounces of beer, 5 ounces of wine, or 1 ounces of hard liquor.  Do not use street drugs.  Do not share needles.  Ask your health care provider for help if you need support or information about quitting drugs.  Tell your health care provider if you often feel depressed.  Tell your health care provider if you have ever been abused or do not feel safe at home.   This information is not intended to replace advice given to you by your health care provider. Make sure you discuss any questions you have with your health care provider.   Document Released: 09/19/2010 Document Revised: 03/27/2014 Document Reviewed: 02/05/2013 Elsevier Interactive Patient Education 2016 [employer].   Hearing  Loss Hearing loss is a partial or total loss of the ability to hear. This can be temporary or permanent, and it can happen in one or both ears. Hearing loss may be referred to as deafness. Medical care is necessary to treat hearing loss properly and to prevent the condition from getting worse. Your hearing may partially or completely come back, depending on what caused your hearing loss and how severe it is. In some cases, hearing loss is permanent. CAUSES Common causes of hearing loss include:   Too much wax in the ear canal.   Infection of the ear canal or middle ear.   Fluid in the middle ear.   Injury to the ear or surrounding area.   An object stuck in the ear.   Prolonged exposure to loud sounds, such as music.  Less common causes of hearing loss include:   Tumors in the ear.   Viral or bacterial infections, such as meningitis.   A hole in the eardrum (perforated eardrum).  Problems with the hearing nerve that sends signals between the brain and the ear.  Certain medicines.  SYMPTOMS  Symptoms of this condition may include:  Difficulty telling the difference between sounds.  Difficulty following a conversation when there is background noise.  Lack of response to sounds in your environment. This may be most noticeable when you do not respond to startling sounds.  Needing to turn up the volume on the television, radio, etc.  Ringing in the ears.  Dizziness.  Pain in the ears. DIAGNOSIS This condition is diagnosed based on a physical exam and a hearing test (audiometry). The audiometry test will be performed by a hearing specialist (audiologist). You may also be referred to an ear, nose, and throat (ENT) specialist (otolaryngologist).  TREATMENT Treatment for recent onset of hearing loss may include:   Ear wax removal.   Being prescribed medicines to prevent infection (antibiotics).   Being prescribed medicines to reduce inflammation  (corticosteroids).  HOME CARE INSTRUCTIONS  If you were prescribed an antibiotic medicine, take it as told by your health care provider. Do not stop taking the antibiotic even if you start to feel better.  Take over-the-counter and prescription medicines only as told by your health care provider.  Avoid loud noises.   Return to your normal activities as told by your health care provider. Ask your health care provider what activities are safe for you.  Keep all follow-up visits as told by your health care provider. This is important. SEEK MEDICAL CARE IF:   You feel dizzy.   You develop new symptoms.   You vomit or feel nauseous.   You have a fever.  SEEK IMMEDIATE MEDICAL CARE IF:  You develop sudden changes in your vision.   You have severe ear pain.   You have new or increased weakness.  You have a severe headache.   This information is not intended to replace advice given to you by your health care provider. Make sure you discuss any questions you have with your health care provider.   Document Released: 03/06/2005 Document Revised: 11/25/2014 Document Reviewed: 07/22/2014 Elsevier Interactive Patient Education [employer].

## 2015-10-13 DIAGNOSIS — H524 Presbyopia: Secondary | ICD-10-CM | POA: Diagnosis not present

## 2015-11-09 DIAGNOSIS — Z471 Aftercare following joint replacement surgery: Secondary | ICD-10-CM | POA: Diagnosis not present

## 2015-11-09 DIAGNOSIS — Z96651 Presence of right artificial knee joint: Secondary | ICD-10-CM | POA: Diagnosis not present

## 2015-11-15 DIAGNOSIS — H2513 Age-related nuclear cataract, bilateral: Secondary | ICD-10-CM | POA: Diagnosis not present

## 2015-11-15 DIAGNOSIS — H40053 Ocular hypertension, bilateral: Secondary | ICD-10-CM | POA: Diagnosis not present

## 2015-12-03 ENCOUNTER — Encounter: Payer: Self-pay | Admitting: Family Medicine

## 2015-12-03 ENCOUNTER — Ambulatory Visit (INDEPENDENT_AMBULATORY_CARE_PROVIDER_SITE_OTHER): Payer: Medicare Other | Admitting: Family Medicine

## 2015-12-03 VITALS — BP 122/68 | HR 86 | Temp 98.1°F | Ht 67.0 in | Wt 164.8 lb

## 2015-12-03 DIAGNOSIS — R300 Dysuria: Secondary | ICD-10-CM | POA: Diagnosis not present

## 2015-12-03 LAB — POCT URINALYSIS DIPSTICK
Bilirubin, UA: NEGATIVE
GLUCOSE UA: NEGATIVE
Ketones, UA: NEGATIVE
NITRITE UA: NEGATIVE
PH UA: 5
Protein, UA: NEGATIVE
Spec Grav, UA: 1.01
UROBILINOGEN UA: 0.2

## 2015-12-03 MED ORDER — NITROFURANTOIN MACROCRYSTAL 100 MG PO CAPS: 100.0000 mg | ORAL_CAPSULE | Freq: Four times a day (QID) | ORAL | 0 refills | Status: DC

## 2015-12-03 NOTE — Progress Notes (Signed)
HPI:   Acute visit for:  Dysuria: -x3 days -burning, frequency, pressure -no fevers, malaise, back pain, flank pain, abd pain, hematuria, NVD, vaginal symptoms -reports hx of UTI and this feels the same -denies known intol to macrobid  ROS: See pertinent positives and negatives per HPI.  Past Medical History:  Diagnosis Date  . Arthritis    OA BOTH KNEES; OCCAS PAIN LEFT SHOULDER - HX OF ROTATOR CUFF PROBLEM  . Dyspepsia   . GERD (gastroesophageal reflux disease)   . GI bleed    GI BLEED FROM DUODENAL ULCER--ABOUT 17 YRS AGO - GIVEN TRANSFUSIONS  . History of blood transfusion    1990's  . History of frequent urinary tract infections   . History of PSVT (paroxysmal supraventricular tachycardia)    can control by doing a valsalva. Triggered by elevated heart rate  . Hypertension   . Imbalance   . Migraine headache    much reduced frequency  . Tachycardia    Episodic  . Tingling    left foot   . TMJ (temporomandibular joint disorder)    PAST HX GRINDING TEETH --BUT NO LONGER A PROBLEM  . Wears glasses     Past Surgical History:  Procedure Laterality Date  . ABDOMINAL HYSTERECTOMY  1985   TAH/BSO--for ?fibroids  . APPENDECTOMY    . ARTHROPLASTY     left Knee   . cyst removed      from mouth  . KNEE ARTHROSCOPY Left 10/2005  . LAPAROSCOPIC APPENDECTOMY  02/2000  . TONSILLECTOMY    . 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 TOTAL KNEE ARTHROPLASTY Right 05/28/2015   Procedure: RIGHT TOTAL KNEE ARTHROPLASTY;  Surgeon: Ollen GrossFrank Aluisio, MD;  Location: [facility];  Service: Orthopedics;  Laterality: Right;  . WISDOM TOOTH EXTRACTION  2008    Family History  Problem Relation Age of Onset  . Cancer Neg Hx     Breast/Colon  . Diabetes Neg Hx   . Heart disease Neg Hx     CAD/MI    Social History   Social History  . Marital status:  Married    Spouse name: N/A  . Number of children: 2  . Years of education: 12   Occupational History  .      retired   Social History Main Topics  . Smoking status: Never Smoker  . Smokeless tobacco: Never Used  . Alcohol use No     Comment: no   . Drug use: No  . Sexual activity: Not Currently    Partners: Male    Birth control/ protection: Surgical     Comment: TAH/BSO   Other Topics Concern  . None   Social History Adult nursearrative   High School Graduate, married 1958 2 sons 1961 and 1963, 1 grandson [id number]. End of life issues - materials provided for contemplation. Still in the contemplative.   One grand son; [id number]      Current Outpatient Prescriptions:  .  amLODipine (NORVASC) 10 MG tablet, TAKE 1 BY MOUTH EVERY MORNING, Disp: 90 tablet, Rfl: 3 .  esomeprazole (NEXIUM) 20 MG capsule, Take 1 capsule (20 mg total) by mouth every morning., Disp: 90 capsule, Rfl: 3 .  furosemide (LASIX) 40 MG tablet, TAKE 1 BY MOUTH DAILY, Disp: 90 tablet, Rfl: 3 .  meloxicam (MOBIC) 7.5 MG tablet, Take 15 mg by mouth daily., Disp: ,  Rfl:  .  nitrofurantoin (MACRODANTIN) 100 MG capsule, Take 1 capsule (100 mg total) by mouth 4 (four) times daily., Disp: 14 capsule, Rfl: 0  EXAM:  Vitals:   12/03/15 1413  BP: 122/68  Pulse: 86  Temp: 98.1 F (36.7 C)    Body mass index is 25.81 kg/m.  GENERAL: vitals reviewed and listed above, alert, oriented, appears well hydrated and in no acute distress  HEENT: atraumatic, conjunttiva clear, no obvious abnormalities on inspection of external nose and ears  NECK: no obvious masses on inspection  LUNGS: clear to auscultation bilaterally, no wheezes, rales or rhonchi, good air movement  CV: HRRR, no peripheral edema  ABD: soft, NTTP, no CVA TTP  MS: moves all extremities without noticeable abnormality  PSYCH: pleasant and cooperative, no obvious depression or anxiety  ASSESSMENT AND PLAN:  Discussed the following assessment and plan:  Dysuria  - Plan: POC Urinalysis Dipstick  -symptom + udip c/w likely uti -opted for empiric tx with macrobid -culture pending -Patient advised to return or notify a doctor immediately if symptoms worsen or persist or new concerns arise.  Declined avs. There are no Patient Instructions on file for this visit.  Kriste Basque R., DO

## 2015-12-03 NOTE — Progress Notes (Signed)
Pre visit review using our clinic review tool, if applicable. No additional management support is needed unless otherwise documented below in the visit note. 

## 2015-12-03 NOTE — Addendum Note (Signed)
Addended by: Johnella MoloneyFUNDERBURK, JO A on: 12/03/2015 03:23 PM   Modules accepted: Orders

## 2015-12-06 LAB — URINE CULTURE

## 2015-12-13 ENCOUNTER — Encounter: Payer: Self-pay | Admitting: Internal Medicine

## 2015-12-14 ENCOUNTER — Ambulatory Visit (INDEPENDENT_AMBULATORY_CARE_PROVIDER_SITE_OTHER): Payer: Medicare Other

## 2015-12-14 DIAGNOSIS — Z23 Encounter for immunization: Secondary | ICD-10-CM

## 2015-12-16 ENCOUNTER — Ambulatory Visit: Payer: Medicare Other

## 2016-02-15 ENCOUNTER — Ambulatory Visit (INDEPENDENT_AMBULATORY_CARE_PROVIDER_SITE_OTHER): Payer: Medicare Other | Admitting: Internal Medicine

## 2016-02-15 ENCOUNTER — Encounter: Payer: Self-pay | Admitting: Internal Medicine

## 2016-02-15 ENCOUNTER — Other Ambulatory Visit: Payer: Medicare Other

## 2016-02-15 VITALS — BP 118/72 | HR 68 | Temp 97.7°F | Resp 12 | Ht 67.0 in | Wt 160.8 lb

## 2016-02-15 DIAGNOSIS — R7989 Other specified abnormal findings of blood chemistry: Secondary | ICD-10-CM | POA: Diagnosis not present

## 2016-02-15 DIAGNOSIS — R7301 Impaired fasting glucose: Secondary | ICD-10-CM | POA: Insufficient documentation

## 2016-02-15 DIAGNOSIS — I471 Supraventricular tachycardia: Secondary | ICD-10-CM | POA: Diagnosis not present

## 2016-02-15 DIAGNOSIS — I700 Atherosclerosis of aorta: Secondary | ICD-10-CM | POA: Insufficient documentation

## 2016-02-15 DIAGNOSIS — R300 Dysuria: Secondary | ICD-10-CM | POA: Diagnosis not present

## 2016-02-15 DIAGNOSIS — K838 Other specified diseases of biliary tract: Secondary | ICD-10-CM | POA: Insufficient documentation

## 2016-02-15 DIAGNOSIS — R739 Hyperglycemia, unspecified: Secondary | ICD-10-CM | POA: Diagnosis not present

## 2016-02-15 DIAGNOSIS — R197 Diarrhea, unspecified: Secondary | ICD-10-CM | POA: Diagnosis not present

## 2016-02-15 DIAGNOSIS — E042 Nontoxic multinodular goiter: Secondary | ICD-10-CM | POA: Diagnosis not present

## 2016-02-15 DIAGNOSIS — R1011 Right upper quadrant pain: Principal | ICD-10-CM | POA: Insufficient documentation

## 2016-02-15 DIAGNOSIS — I081 Rheumatic disorders of both mitral and tricuspid valves: Secondary | ICD-10-CM | POA: Insufficient documentation

## 2016-02-15 DIAGNOSIS — Z7982 Long term (current) use of aspirin: Secondary | ICD-10-CM | POA: Insufficient documentation

## 2016-02-15 DIAGNOSIS — M47816 Spondylosis without myelopathy or radiculopathy, lumbar region: Secondary | ICD-10-CM | POA: Insufficient documentation

## 2016-02-15 DIAGNOSIS — K8050 Calculus of bile duct without cholangitis or cholecystitis without obstruction: Secondary | ICD-10-CM | POA: Diagnosis not present

## 2016-02-15 DIAGNOSIS — Z8711 Personal history of peptic ulcer disease: Secondary | ICD-10-CM | POA: Insufficient documentation

## 2016-02-15 DIAGNOSIS — M170 Bilateral primary osteoarthritis of knee: Secondary | ICD-10-CM | POA: Insufficient documentation

## 2016-02-15 DIAGNOSIS — R079 Chest pain, unspecified: Secondary | ICD-10-CM | POA: Diagnosis not present

## 2016-02-15 DIAGNOSIS — I10 Essential (primary) hypertension: Secondary | ICD-10-CM | POA: Diagnosis not present

## 2016-02-15 DIAGNOSIS — K219 Gastro-esophageal reflux disease without esophagitis: Secondary | ICD-10-CM | POA: Insufficient documentation

## 2016-02-15 DIAGNOSIS — Z882 Allergy status to sulfonamides status: Secondary | ICD-10-CM | POA: Insufficient documentation

## 2016-02-15 DIAGNOSIS — N390 Urinary tract infection, site not specified: Secondary | ICD-10-CM | POA: Insufficient documentation

## 2016-02-15 DIAGNOSIS — E872 Acidosis: Secondary | ICD-10-CM | POA: Diagnosis not present

## 2016-02-15 DIAGNOSIS — Z87440 Personal history of urinary (tract) infections: Secondary | ICD-10-CM | POA: Diagnosis not present

## 2016-02-15 DIAGNOSIS — Z880 Allergy status to penicillin: Secondary | ICD-10-CM | POA: Diagnosis not present

## 2016-02-15 DIAGNOSIS — Z96653 Presence of artificial knee joint, bilateral: Secondary | ICD-10-CM | POA: Diagnosis not present

## 2016-02-15 DIAGNOSIS — Z9049 Acquired absence of other specified parts of digestive tract: Secondary | ICD-10-CM | POA: Insufficient documentation

## 2016-02-15 DIAGNOSIS — Z90710 Acquired absence of both cervix and uterus: Secondary | ICD-10-CM | POA: Diagnosis not present

## 2016-02-15 LAB — POCT URINALYSIS DIPSTICK
Bilirubin, UA: NEGATIVE
Blood, UA: NEGATIVE
GLUCOSE UA: NEGATIVE
KETONES UA: NEGATIVE
Nitrite, UA: NEGATIVE
SPEC GRAV UA: 1.025
Urobilinogen, UA: NEGATIVE
pH, UA: 5

## 2016-02-15 MED ORDER — NITROFURANTOIN MACROCRYSTAL 100 MG PO CAPS: 100.0000 mg | ORAL_CAPSULE | Freq: Two times a day (BID) | ORAL | 0 refills | Status: DC

## 2016-02-15 NOTE — Progress Notes (Signed)
   Subjective:    Patient ID: Kaylee Davis, female    DOB: 11/21/1940, 75 y.o.   MRN: [id number]  HPI The patient is a 75 YO female coming in for recurrent UTIs. She feels that she has a UTI right now as well. Denies fevers or chills. Some pressure for the last 2 weeks. More urination and urgency with urination. No back pain or severe stomach pain. Has not tried anything for it. Had a bladder infection back in September.   Review of Systems  Constitutional: Negative.   Respiratory: Negative.   Cardiovascular: Negative.   Gastrointestinal: Negative.   Genitourinary: Positive for dysuria, frequency and urgency. Negative for difficulty urinating, flank pain, menstrual problem and pelvic pain.  Musculoskeletal: Negative.       Objective:   Physical Exam  Constitutional: She is oriented to person, place, and time. She appears well-developed and well-nourished.  HENT:  Head: Normocephalic and atraumatic.  Eyes: EOM are normal.  Cardiovascular: Normal rate and regular rhythm.   Pulmonary/Chest: Effort normal. No respiratory distress. She has no wheezes. She has no rales.  Abdominal: Soft. Bowel sounds are normal. She exhibits no distension. There is no tenderness. There is no rebound and no guarding.  Mild suprapubic pressure.   Neurological: She is alert and oriented to person, place, and time.  Skin: Skin is warm and dry.   Vitals:   02/15/16 1124  BP: 118/72  Pulse: 68  Resp: 12  Temp: 97.7 F (36.5 C)  TempSrc: Oral  SpO2: 98%  Weight: 160 lb 12.8 oz (72.9 kg)  Height: 5\' 7"  (1.702 m)      Assessment & Plan:

## 2016-02-15 NOTE — Patient Instructions (Signed)
We have sent in the antibiotic called macrobid or nitrofurantoin. Take 1 pill twice a day for 1 week to clear the infection.   We are sending it for culture to make sure the antibiotic is right.   Keep drinking plenty of fluids to help clear the infection.

## 2016-02-15 NOTE — Progress Notes (Signed)
Pre visit review using our clinic review tool, if applicable. No additional management support is needed unless otherwise documented below in the visit note. 

## 2016-02-15 NOTE — Assessment & Plan Note (Signed)
U/A consistent with UTI and will treat with macrobid for 7 days given sulfa allergy. No signs of pyelonephritis.

## 2016-02-16 ENCOUNTER — Observation Stay (HOSPITAL_COMMUNITY): Payer: Medicare Other

## 2016-02-16 ENCOUNTER — Encounter (HOSPITAL_COMMUNITY): Payer: Self-pay | Admitting: Emergency Medicine

## 2016-02-16 ENCOUNTER — Observation Stay (HOSPITAL_COMMUNITY)
Admission: EM | Admit: 2016-02-16 | Discharge: 2016-02-17 | Disposition: A | Discharge status: Home / Self Care | Payer: Medicare Other | Attending: Internal Medicine | Admitting: Internal Medicine

## 2016-02-16 ENCOUNTER — Emergency Department (HOSPITAL_COMMUNITY): Payer: Medicare Other

## 2016-02-16 ENCOUNTER — Other Ambulatory Visit (HOSPITAL_COMMUNITY): Payer: Medicare Other

## 2016-02-16 DIAGNOSIS — R079 Chest pain, unspecified: Secondary | ICD-10-CM | POA: Diagnosis present

## 2016-02-16 DIAGNOSIS — I10 Essential (primary) hypertension: Secondary | ICD-10-CM | POA: Diagnosis not present

## 2016-02-16 DIAGNOSIS — R1011 Right upper quadrant pain: Secondary | ICD-10-CM

## 2016-02-16 DIAGNOSIS — K8050 Calculus of bile duct without cholangitis or cholecystitis without obstruction: Secondary | ICD-10-CM

## 2016-02-16 DIAGNOSIS — K838 Other specified diseases of biliary tract: Secondary | ICD-10-CM

## 2016-02-16 DIAGNOSIS — R7989 Other specified abnormal findings of blood chemistry: Secondary | ICD-10-CM | POA: Diagnosis not present

## 2016-02-16 LAB — COMPREHENSIVE METABOLIC PANEL
ALBUMIN: 4.3 g/dL (ref 3.5–5.0)
ALT: 39 U/L (ref 14–54)
ANION GAP: 10 (ref 5–15)
AST: 100 U/L — ABNORMAL HIGH (ref 15–41)
Alkaline Phosphatase: 137 U/L — ABNORMAL HIGH (ref 38–126)
BILIRUBIN TOTAL: 0.6 mg/dL (ref 0.3–1.2)
BUN: 21 mg/dL — AB (ref 6–20)
CALCIUM: 9.4 mg/dL (ref 8.9–10.3)
CO2: 24 mmol/L (ref 22–32)
CREATININE: 0.86 mg/dL (ref 0.44–1.00)
Chloride: 105 mmol/L (ref 101–111)
GFR calc Af Amer: >60 mL/min (ref >60)
GFR calc non Af Amer: >60 mL/min (ref >60)
Glucose, Bld: 177 mg/dL — ABNORMAL HIGH (ref 65–99)
Potassium: 3.1 mmol/L — ABNORMAL LOW (ref 3.5–5.1)
SODIUM: 139 mmol/L (ref 135–145)
Total Protein: 7 g/dL (ref 6.5–8.1)

## 2016-02-16 LAB — I-STAT CHEM 8, ED
BUN: 22 mg/dL — ABNORMAL HIGH (ref 6–20)
CALCIUM ION: 1.14 mmol/L — AB (ref 1.15–1.40)
Chloride: 102 mmol/L (ref 101–111)
Creatinine, Ser: 0.9 mg/dL (ref 0.44–1.00)
GLUCOSE: 175 mg/dL — AB (ref 65–99)
HCT: 41 % (ref 36.0–46.0)
HEMOGLOBIN: 13.9 g/dL (ref 12.0–15.0)
Potassium: 3.1 mmol/L — ABNORMAL LOW (ref 3.5–5.1)
SODIUM: 141 mmol/L (ref 135–145)
TCO2: 26 mmol/L (ref 0–100)

## 2016-02-16 LAB — CBC WITH DIFFERENTIAL/PLATELET
BASOS ABS: 0 10*3/uL (ref 0.0–0.1)
BASOS PCT: 0 %
Basophils Absolute: 0 10*3/uL (ref 0.0–0.1)
Basophils Relative: 0 %
EOS ABS: 0 10*3/uL (ref 0.0–0.7)
EOS ABS: 0.2 10*3/uL (ref 0.0–0.7)
EOS PCT: 0 %
EOS PCT: 1 %
HCT: 39.5 % (ref 36.0–46.0)
HEMATOCRIT: 40.1 % (ref 36.0–46.0)
Hemoglobin: 13.1 g/dL (ref 12.0–15.0)
Hemoglobin: 13.3 g/dL (ref 12.0–15.0)
LYMPHS ABS: 0.5 10*3/uL — AB (ref 0.7–4.0)
LYMPHS PCT: 4 %
Lymphocytes Relative: 16 %
Lymphs Abs: 2.1 10*3/uL (ref 0.7–4.0)
MCH: 28.7 pg (ref 26.0–34.0)
MCH: 28.9 pg (ref 26.0–34.0)
MCHC: 33.2 g/dL (ref 30.0–36.0)
MCHC: 33.2 g/dL (ref 30.0–36.0)
MCV: 86.6 fL (ref 78.0–100.0)
MCV: 87.2 fL (ref 78.0–100.0)
MONO ABS: 0.3 10*3/uL (ref 0.1–1.0)
MONO ABS: 0.5 10*3/uL (ref 0.1–1.0)
MONOS PCT: 3 %
MONOS PCT: 4 %
Neutro Abs: 10.1 10*3/uL — ABNORMAL HIGH (ref 1.7–7.7)
Neutro Abs: 10.8 10*3/uL — ABNORMAL HIGH (ref 1.7–7.7)
Neutrophils Relative %: 79 %
Neutrophils Relative %: 93 %
PLATELETS: 280 10*3/uL (ref 150–400)
PLATELETS: 294 10*3/uL (ref 150–400)
RBC: 4.53 MIL/uL (ref 3.87–5.11)
RBC: 4.63 MIL/uL (ref 3.87–5.11)
RDW: 13 % (ref 11.5–15.5)
RDW: 13.1 % (ref 11.5–15.5)
WBC: 11 10*3/uL — ABNORMAL HIGH (ref 4.0–10.5)
WBC: 13.7 10*3/uL — ABNORMAL HIGH (ref 4.0–10.5)

## 2016-02-16 LAB — HEPATIC FUNCTION PANEL
ALBUMIN: 4.6 g/dL (ref 3.5–5.0)
ALK PHOS: 145 U/L — AB (ref 38–126)
ALT: 427 U/L — ABNORMAL HIGH (ref 14–54)
AST: 767 U/L — AB (ref 15–41)
BILIRUBIN TOTAL: 1.5 mg/dL — AB (ref 0.3–1.2)
Bilirubin, Direct: 0.5 mg/dL (ref 0.1–0.5)
Indirect Bilirubin: 1 mg/dL — ABNORMAL HIGH (ref 0.3–0.9)
Total Protein: 6.9 g/dL (ref 6.5–8.1)

## 2016-02-16 LAB — URINALYSIS, ROUTINE W REFLEX MICROSCOPIC
Bilirubin Urine: NEGATIVE
GLUCOSE, UA: NEGATIVE mg/dL
Hgb urine dipstick: NEGATIVE
KETONES UR: NEGATIVE mg/dL
LEUKOCYTES UA: NEGATIVE
Nitrite: NEGATIVE
PH: 7.5 (ref 5.0–8.0)
Protein, ur: NEGATIVE mg/dL
SPECIFIC GRAVITY, URINE: 1.018 (ref 1.005–1.030)

## 2016-02-16 LAB — CK: Total CK: 25 U/L — ABNORMAL LOW (ref 38–234)

## 2016-02-16 LAB — TROPONIN I: Troponin I: <0.03 ng/mL (ref <0.03)

## 2016-02-16 LAB — BASIC METABOLIC PANEL
Anion gap: 9 (ref 5–15)
BUN: 17 mg/dL (ref 6–20)
CHLORIDE: 105 mmol/L (ref 101–111)
CO2: 25 mmol/L (ref 22–32)
CREATININE: 0.67 mg/dL (ref 0.44–1.00)
Calcium: 9.2 mg/dL (ref 8.9–10.3)
GFR calc Af Amer: >60 mL/min (ref >60)
GFR calc non Af Amer: >60 mL/min (ref >60)
GLUCOSE: 111 mg/dL — AB (ref 65–99)
POTASSIUM: 4.3 mmol/L (ref 3.5–5.1)
Sodium: 139 mmol/L (ref 135–145)

## 2016-02-16 LAB — I-STAT TROPONIN, ED: Troponin i, poc: 0 ng/mL (ref 0.00–0.08)

## 2016-02-16 LAB — PROTIME-INR
INR: 0.87
Prothrombin Time: 11.8 seconds (ref 11.4–15.2)

## 2016-02-16 LAB — I-STAT CG4 LACTIC ACID, ED: Lactic Acid, Venous: 2.57 mmol/L (ref 0.5–1.9)

## 2016-02-16 LAB — LIPASE, BLOOD
LIPASE: 48 U/L (ref 11–51)
Lipase: 44 U/L (ref 11–51)

## 2016-02-16 LAB — TSH: TSH: 1.486 u[IU]/mL (ref 0.350–4.500)

## 2016-02-16 MED ORDER — SODIUM CHLORIDE 0.9 % IV BOLUS (SEPSIS)
1000.0000 mL | Freq: Once | INTRAVENOUS | Status: AC
Start: 2016-02-16 — End: 2016-02-16
  Administered 2016-02-16: 1000 mL via INTRAVENOUS

## 2016-02-16 MED ORDER — MORPHINE SULFATE (PF) 4 MG/ML IV SOLN
6.0000 mg | Freq: Once | INTRAVENOUS | Status: AC
  Administered 2016-02-16: 6 mg via INTRAVENOUS
  Filled 2016-02-16: qty 2

## 2016-02-16 MED ORDER — MORPHINE SULFATE (PF) 4 MG/ML IV SOLN
INTRAVENOUS | Status: AC
  Administered 2016-02-16: 3 mg via INTRAVENOUS
  Filled 2016-02-16: qty 1

## 2016-02-16 MED ORDER — IOPAMIDOL (ISOVUE-370) INJECTION 76%
100.0000 mL | Freq: Once | INTRAVENOUS | Status: AC | PRN
  Administered 2016-02-16: 80 mL via INTRAVENOUS

## 2016-02-16 MED ORDER — METRONIDAZOLE 500 MG PO TABS
500.0000 mg | ORAL_TABLET | Freq: Three times a day (TID) | ORAL | Status: DC
  Administered 2016-02-16 – 2016-02-17 (×4): 500 mg via ORAL
  Filled 2016-02-16 (×4): qty 1

## 2016-02-16 MED ORDER — ASPIRIN EC 325 MG PO TBEC
325.0000 mg | DELAYED_RELEASE_TABLET | Freq: Every day | ORAL | Status: DC
  Administered 2016-02-16: 325 mg via ORAL
  Filled 2016-02-16 (×2): qty 1

## 2016-02-16 MED ORDER — ONDANSETRON HCL 4 MG/2ML IJ SOLN
4.0000 mg | Freq: Once | INTRAMUSCULAR | Status: AC
  Administered 2016-02-16: 4 mg via INTRAVENOUS
  Filled 2016-02-16: qty 2

## 2016-02-16 MED ORDER — NITROGLYCERIN 0.4 MG SL SUBL: 0.4000 mg | SUBLINGUAL_TABLET | SUBLINGUAL | Status: DC | PRN

## 2016-02-16 MED ORDER — POTASSIUM CHLORIDE IN NACL 20-0.9 MEQ/L-% IV SOLN
INTRAVENOUS | Status: DC
  Administered 2016-02-16: 13:00:00 via INTRAVENOUS
  Filled 2016-02-16 (×3): qty 1000

## 2016-02-16 MED ORDER — CIPROFLOXACIN IN D5W 400 MG/200ML IV SOLN
400.0000 mg | Freq: Two times a day (BID) | INTRAVENOUS | Status: DC
  Administered 2016-02-16 – 2016-02-17 (×3): 400 mg via INTRAVENOUS
  Filled 2016-02-16 (×2): qty 200

## 2016-02-16 MED ORDER — ACETAMINOPHEN 325 MG PO TABS: 650.0000 mg | ORAL_TABLET | ORAL | Status: DC | PRN

## 2016-02-16 MED ORDER — AMLODIPINE BESYLATE 10 MG PO TABS
10.0000 mg | ORAL_TABLET | Freq: Every day | ORAL | Status: DC
Start: 2016-02-16 — End: 2016-02-17
  Administered 2016-02-16 – 2016-02-17 (×2): 10 mg via ORAL
  Filled 2016-02-16 (×2): qty 1

## 2016-02-16 MED ORDER — POTASSIUM CHLORIDE CRYS ER 20 MEQ PO TBCR
40.0000 meq | EXTENDED_RELEASE_TABLET | Freq: Once | ORAL | Status: AC
  Administered 2016-02-16: 40 meq via ORAL
  Filled 2016-02-16: qty 2

## 2016-02-16 MED ORDER — PANTOPRAZOLE SODIUM 40 MG PO TBEC
40.0000 mg | DELAYED_RELEASE_TABLET | Freq: Every day | ORAL | Status: DC
  Administered 2016-02-16 – 2016-02-17 (×2): 40 mg via ORAL
  Filled 2016-02-16 (×2): qty 1

## 2016-02-16 MED ORDER — MORPHINE SULFATE (PF) 2 MG/ML IV SOLN: 2.0000 mg | INTRAVENOUS | Status: DC | PRN

## 2016-02-16 MED ORDER — MORPHINE SULFATE (PF) 4 MG/ML IV SOLN
3.0000 mg | Freq: Once | INTRAVENOUS | Status: AC
  Administered 2016-02-16: 3 mg via INTRAVENOUS

## 2016-02-16 MED ORDER — ONDANSETRON HCL 4 MG/2ML IJ SOLN
4.0000 mg | Freq: Four times a day (QID) | INTRAMUSCULAR | Status: DC | PRN
  Administered 2016-02-16: 4 mg via INTRAVENOUS
  Filled 2016-02-16: qty 2

## 2016-02-16 MED ORDER — ASPIRIN 325 MG PO TABS: 325.0000 mg | ORAL_TABLET | Freq: Once | ORAL | Status: DC

## 2016-02-16 MED ORDER — TECHNETIUM TC 99M MEBROFENIN IV KIT
5.0000 | PACK | Freq: Once | INTRAVENOUS | Status: AC | PRN
  Administered 2016-02-16: 5 via INTRAVENOUS

## 2016-02-16 NOTE — Consult Note (Signed)
Referring Provider: [facility]   Primary Care Physician:  Hoyt Koch, MD Primary Gastroenterologist:  Silvano Rusk, MD (remote)  Reason for Consultation:  Elevated LFTs  HPI: Kaylee Davis is a relatively healthy 75 y.o. female who presented to ED early this am with acute onset of severe chest pain. She went to bed feeling completely well but was awoken around 11pm with severe mid chest pain radiating through to her back. No associated shortness of breath or diaphoresis. She recalls feeling nauseated but no vomiting. Patient has never had this type of pain, she was concerned pain could represent a heart attack and after 45 minutes of unrelenting pain she came to ED. Troponins and EKG are unremarkable. CXR and chest CTangio unrevealing. Normal lipase. Presenting LFTs revealed minimally elevated AST and alk phos. This am AST 700s / ALT 400s. Tbili 1.5. Mild dilation of CBD on u/s but no cholelithiasis. HIDA negative.   Patient hasn't had any pain this am though she was given Morphine for the HIDA. She started macrobid at home yesterday for UTI but has taken it before without incident. No other new medications.    Past Medical History:  Diagnosis Date  . Arthritis    OA BOTH KNEES; OCCAS PAIN LEFT SHOULDER - HX OF ROTATOR CUFF PROBLEM  . Dyspepsia   . GERD (gastroesophageal reflux disease)   . GI bleed    GI BLEED FROM DUODENAL ULCER--ABOUT 17 YRS AGO - GIVEN TRANSFUSIONS  . History of blood transfusion    1990's  . History of frequent urinary tract infections   . History of PSVT (paroxysmal supraventricular tachycardia)    can control by doing a valsalva. Triggered by elevated heart rate  . Hypertension   . Imbalance   . Migraine headache    much reduced frequency  . Tachycardia    Episodic  . Tingling    left foot   . TMJ (temporomandibular joint disorder)    PAST HX GRINDING TEETH --BUT NO LONGER A PROBLEM  . Wears glasses     Past Surgical History:    Procedure Laterality Date  . ABDOMINAL HYSTERECTOMY  1985   TAH/BSO--for ?fibroids  . APPENDECTOMY    . ARTHROPLASTY     left Knee   . cyst removed      from mouth  . KNEE ARTHROSCOPY Left 10/2005  . LAPAROSCOPIC APPENDECTOMY  02/2000  . TONSILLECTOMY    . 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 TOTAL KNEE ARTHROPLASTY Right 05/28/2015   Procedure: RIGHT TOTAL KNEE ARTHROPLASTY;  Surgeon: Gaynelle Arabian, MD;  Location: [facility];  Service: Orthopedics;  Laterality: Right;  . [place] EXTRACTION  2008    Prior to Admission medications   Medication Sig Start Date End Date Taking? Authorizing Provider  amLODipine (NORVASC) 10 MG tablet TAKE 1 BY MOUTH EVERY MORNING 05/20/15  Yes Hoyt Koch, MD  esomeprazole (NEXIUM) 20 MG capsule Take 1 capsule (20 mg total) by mouth every morning. 05/20/15  Yes Hoyt Koch, MD  furosemide (LASIX) 40 MG tablet TAKE 1 BY MOUTH DAILY 05/20/15  Yes Hoyt Koch, MD  nitrofurantoin (MACRODANTIN) 100 MG capsule Take 1 capsule (100 mg total) by mouth 2 (two) times daily. 02/15/16  Yes Hoyt Koch, MD    Current Facility-Administered Medications  Medication Dose Route Frequency Provider Last Rate Last  Dose  . 0.9 % NaCl with KCl 20 mEq/ L  infusion   Intravenous Continuous Florencia Reasons, MD 50 mL/hr at 02/16/16 1306    . acetaminophen (TYLENOL) tablet 650 mg  650 mg Oral Q4H PRN Rise Patience, MD      . amLODipine (NORVASC) tablet 10 mg  10 mg Oral Daily Rise Patience, MD   10 mg at 02/16/16 1352  . aspirin EC tablet 325 mg  325 mg Oral Daily Rise Patience, MD   325 mg at 02/16/16 1307  . ciprofloxacin (CIPRO) IVPB 400 mg  400 mg Intravenous Q12H Florencia Reasons, MD   400 mg at 02/16/16 1351  . metroNIDAZOLE (FLAGYL) tablet 500 mg  500 mg Oral Q8H Florencia Reasons, MD      . morphine 2 MG/ML  injection 2 mg  2 mg Intravenous Q2H PRN Rise Patience, MD      . nitroGLYCERIN (NITROSTAT) SL tablet 0.4 mg  0.4 mg Sublingual Q5 min PRN Everlene Balls, MD      . ondansetron (ZOFRAN) injection 4 mg  4 mg Intravenous Q6H PRN Rise Patience, MD   4 mg at 02/16/16 0802  . pantoprazole (PROTONIX) EC tablet 40 mg  40 mg Oral Daily Rise Patience, MD   40 mg at 02/16/16 1351    Allergies as of 02/15/2016 - Review Complete 02/15/2016  Allergen Reaction Noted  . Other  07/19/2012  . Penicillins Hives 12/06/2006  . Sulfonamide derivatives Hives 12/06/2006    Family History  Problem Relation Age of Onset  . Cancer Neg Hx     Breast/Colon  . Diabetes Neg Hx   . Heart disease Neg Hx     CAD/MI    Social History   Social History  . Marital status: Married    Spouse name: N/A  . Number of children: 2  . Years of education: 12   Occupational History  .      retired   Social History Main Topics  . Smoking status: Never Smoker  . Smokeless tobacco: Never Used  . Alcohol use No     Comment: no   . Drug use: No  . Sexual activity: Not Currently    Partners: Male    Birth control/ protection: Surgical     Comment: TAH/BSO   Other Topics Concern  . Not on file   Social History Narrative   [employer], married 1958 2 sons 1961 and 1963, 1 grandson 30. End of life issues - materials provided for contemplation. Still in the contemplative.   One grand son; 72     Review of Systems: All systems reviewed and negative except where noted in HPI.  Physical Exam: Vital signs in last 24 hours: Temp:  [98.3 F (36.8 C)-98.7 F (37.1 C)] 98.7 F (37.1 C) (11/29 0855) Pulse Rate:  [71-98] 71 (11/29 1350) Resp:  [12-24] 16 (11/29 0855) BP: (120-172)/(50-73) 133/55 (11/29 1350) SpO2:  [93 %-100 %] 96 % (11/29 0855) Last BM Date: 02/15/16 General:   Well-developed, white female in NAD. Husband in room Head:  Normocephalic and atraumatic. Eyes:  Sclera clear,  no icterus.   Conjunctiva pink. Ears:  Normal auditory acuity. Nose:  No deformity, discharge, or lesions. Mouth:  No deformity or lesions.   Neck:  Supple; no masses. Lungs:  Clear throughout to auscultation.   No wheezes, crackles, or rhonchi.  Heart:  Regular rate and rhythm; soft murmur present, no peripheral edema. Abdomen:  Soft,nontender, BS active,nonpalp mass or hsm.   Rectal:  Deferred  Msk:  Symmetrical without gross deformities. . Pulses:  Normal pulses noted. Extremities:  Without clubbing or edema. Neurologic:  Alert and  oriented x4;  grossly normal neurologically. Skin:  Intact without significant lesions or rashes.. Psych:  Alert and cooperative. Normal mood and affect.  Intake/Output from previous day: 11/28 0701 - 11/29 0700 In: 1000 [I.V.:1000] Out: -  Intake/Output this shift: No intake/output data recorded.  Lab Results:  Recent Labs  02/16/16 0039 02/16/16 0051 02/16/16 0733  WBC 13.7*  --  11.0*  HGB 13.3 13.9 13.1  HCT 40.1 41.0 39.5  PLT 294  --  280   BMET  Recent Labs  02/16/16 0039 02/16/16 0051 02/16/16 0733  NA 139 141 139  K 3.1* 3.1* 4.3  CL 105 102 105  CO2 24  --  25  GLUCOSE 177* 175* 111*  BUN 21* 22* 17  CREATININE 0.86 0.90 0.67  CALCIUM 9.4  --  9.2   LFT  Recent Labs  02/16/16 0733  PROT 6.9  ALBUMIN 4.6  AST 767*  ALT 427*  ALKPHOS 145*  BILITOT 1.5*  BILIDIR 0.5  IBILI 1.0*   PT/INR  Recent Labs  02/16/16 0039  LABPROT 11.8  INR 0.87     Ref. Range 02/16/2016 07:33  Alkaline Phosphatase Latest Ref Range: 38 - 126 U/L 145 (H)  Albumin Latest Ref Range: 3.5 - 5.0 g/dL 4.6  Lipase Latest Ref Range: 11 - 51 U/L 44  AST Latest Ref Range: 15 - 41 U/L 767 (H)  ALT Latest Ref Range: 14 - 54 U/L 427 (H)  Total Protein Latest Ref Range: 6.5 - 8.1 g/dL 6.9  Bilirubin, Direct Latest Ref Range: 0.1 - 0.5 mg/dL 0.5  Indirect Bilirubin Latest Ref Range: 0.3 - 0.9 mg/dL 1.0 (H)  Total Bilirubin Latest Ref  Range: 0.3 - 1.2 mg/dL 1.5 (H)    Hepatitis Panel No results for input(s): HEPBSAG, HCVAB, HEPAIGM, HEPBIGM in the last 72 hours.  Studies/Results: Dg Chest 2 View  Result Date: 02/16/2016 CLINICAL DATA:  Chest pain EXAM: CHEST  2 VIEW COMPARISON:  Chest radiograph 5242 FINDINGS: Shallow lung inflation. Cardiomediastinal contours are normal. No pneumothorax or pleural effusion.No focal airspace consolidation or pulmonary edema. IMPRESSION: No active cardiopulmonary disease. Electronically Signed   By: Ulyses Jarred M.D.   On: 02/16/2016 01:24   Nm Hepatobiliary Liver Func  Result Date: 02/16/2016 CLINICAL DATA:  Right upper quadrant abdominal pain. EXAM: NUCLEAR MEDICINE HEPATOBILIARY IMAGING TECHNIQUE: Sequential images of the abdomen were obtained out to 60 minutes following intravenous administration of radiopharmaceutical. RADIOPHARMACEUTICALS:  5.0 mCi Tc-20m Choletec IV COMPARISON:  Ultrasound of same day. FINDINGS: Prompt uptake and biliary excretion of activity by the liver is seen. Gallbladder activity is visualized, consistent with patency of cystic duct. Biliary activity passes into small bowel, consistent with patent common bile duct. IMPRESSION: Filling of gallbladder is noted. No definite evidence of cystic duct obstruction. Electronically Signed   By: JMarijo Conception M.D.   On: 02/16/2016 11:41   Ct Angio Chest/abd/pel For Dissection W And/or Wo Contrast  Result Date: 02/16/2016 CLINICAL DATA:  Chest pain going to the back EXAM: CT ANGIOGRAPHY CHEST, ABDOMEN AND PELVIS TECHNIQUE: Multidetector CT imaging through the chest, abdomen and pelvis was performed using the standard protocol during bolus administration of intravenous contrast. Multiplanar reconstructed images and MIPs were obtained and reviewed to evaluate the vascular anatomy. CONTRAST:  80  mL Isovue 370 intravenous COMPARISON:  None. FINDINGS: CTA CHEST FINDINGS Cardiovascular: Inadequate opacification of the pulmonary  arterial system to assess for acute pulmonary embolus. There is no evidence for aortic dissection. Ascending aortic diameter is 3.2 cm. Mild atherosclerosis of the aortic arch. Normal 3 vessel origin from the aortic arch which is left-sided. Great vessels are widely patent. Heart size is within normal limits. No pericardial effusion. Mediastinum/Nodes: Multiple sub cm hypodense nodules in the thyroid gland. Trachea and mainstem bronchi appear normal. No significantly enlarged mediastinal or hilar lymph nodes. The esophagus is within normal limits. Lungs/Pleura: Lungs are clear. No pleural effusion or pneumothorax. Musculoskeletal: No chest wall abnormality. No acute or significant osseous findings. Review of the MIP images confirms the above findings. CTA ABDOMEN AND PELVIS FINDINGS VASCULAR Aorta: Aorta is non aneurysmal. Scattered atherosclerotic calcification and mild mural thrombus. No dissection is seen. 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 Distal vessels are widely patent. SMA: Patent Renals: Single left renal artery which is widely patent. There are 2 right renal arteries, the superior artery is diminutive throughout its course and there is possible mild to moderate focal stenosis at its origin. The more inferior dominant artery demonstrates mild distal narrowing but is otherwise patent. IMA: Patent Inflow: No significant stenosis within the distal abdominal aorta or iliac vessels. External iliac arteries are patent. Veins: Limited evaluation due to arterial phase study. Review of the MIP images confirms the above findings. NON-VASCULAR Hepatobiliary: No focal hepatic abnormality. No calcified gallstones. Mildly prominent extrahepatic common bile duct measuring up to 8 mm. Questionable mild wall thickening versus contraction of the gallbladder. Pancreas: Unremarkable. No pancreatic ductal dilatation or surrounding inflammatory changes. Spleen:  Normal in size without focal abnormality. Adrenals/Urinary Tract: Adrenal glands are unremarkable. Kidneys are normal, without renal calculi, focal lesion, or hydronephrosis. Bladder is unremarkable. Stomach/Bowel: Stomach is mildly distended. No dilated small bowel. Post appendectomy changes. No colon wall thickening. Lymphatic: No significantly enlarged abdominal or pelvic lymph nodes. Reproductive: Status post hysterectomy. No adnexal masses. Other: No free air or free fluid Musculoskeletal: No acute osseous abnormality. Degenerative changes most notable at L2-L3 were there is mild retrolisthesis of L2 on L3. Review of the MIP images confirms the above findings. IMPRESSION: 1. No CT evidence for acute aortic dissection. 2. Multiple sub cm hypodense nodules in the thyroid gland. 3. Mildly prominent extrahepatic common bile duct . No calcified stones are visualized. There is questionable gallbladder wall thickening versus contraction. If symptoms suggest gallbladder disease, correlation with ultrasound may be obtained. Electronically Signed   By: Donavan Foil M.D.   On: 02/16/2016 01:50   US Abdomen Limited Ruq  Result Date: 02/16/2016 CLINICAL DATA:  Right upper quadrant pain EXAM: US ABDOMEN LIMITED - RIGHT UPPER QUADRANT COMPARISON:  CTA 02/16/2016 FINDINGS: Gallbladder: No gallstones or wall thickening visualized. No sonographic Murphy sign noted by sonographer. Common bile duct: Diameter: Borderline dilated at 6.6 mm maximum. Liver: No focal lesion identified. Within normal limits in parenchymal echogenicity. IMPRESSION: 1. No sonographic evidence for cholelithiasis or acute cholecystitis. 2. Borderline dilated common bile duct measuring slightly greater than 6 mm. Consider correlation with laboratory values to assess for obstruction. Electronically Signed   By: Donavan Foil M.D.   On: 02/16/2016 03:08    IMPRESSION / PLAN:   56. 75 yo relatively healthy female admitted with severe chest pain  radiating through to back and abnormal LFTs. Presenting LFTs minimally elevated but marked rise  in transaminases overnight with slight rise in T.bili. Borderline dilated CBC without cholelithiasis. HIDA unremarkable. No evidence for cardiopulmonary event.  -Etiology of pain / abnormal LFTs not clear. Will obtain MRCP to look for small stones in CBD.  -Viral hepatitis studies pending.  -obtain CK level -am CMET  2. Leukocytosis / elevated lactate 2.57 on admission. WBC 13.7, down to 11 overnight. Mild left shift. Hospitalist is covering her for possible GI source. Getting IVF. Hopefully we can stop antibiotics soon. She has no abdominal pain,   2. ? UTI. Saw PCP yesterday, started on macrodantin for UTI. U/A today unremarkable. She still has some frequency, suprapubic burning and pressure. Of note, she has taken macrobid in the past without problems so doubt antibiotic contributing to abnormal LFTS.   3. Hx of GERD.  -continue PPI  4. 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 SMA and IMA patent.   5. Hyperglycemia, presenting glucose 177, A1c pending  Tye Savoy  02/16/2016, 2:46 PM Pager number [phone]  GI ATTENDING  History, laboratories, x-rays reviewed. Patient personally seen and examined. Husband in room. Agree with comprehensive consultation note as outlined above. Interesting case. Patient presents with abrupt onset chest pain as described. Resolved after several hours. Treated for UTI yesterday. Given antibiotic Bactrim. Mild elevation of LFTs initially. Marked elevation 6 hours later. Though choledocholithiasis would be a unifying diagnosis, no evidence of such directly or indirectly on imaging. Despite this, I would proceed with MRCP given the index of suspicion. As well, workup for other abnormalities of LFTs both primary liver origin and secondary or other. Discussed with patient and husband. We'll follow.  Docia Chuck. Geri Seminole., M.D. [facility] Division of Gastroenterology

## 2016-02-16 NOTE — ED Triage Notes (Addendum)
Pt c/o chest pain that radiates down through her abdomen and woke her from her sleep 2 hours ago; denies SOB, however is tachypnic upon assessment; endorses minor lightheadedness; denies cardiac history; states she currently has a bladder infection that she started taking medication for this AM  Pt also reports diarrhea

## 2016-02-16 NOTE — ED Notes (Signed)
Bed: WA12 Expected date:  Expected time:  Means of arrival:  Comments: Triage 4 

## 2016-02-16 NOTE — Progress Notes (Signed)
PROGRESS NOTE  Kaylee Davis:[id number] DOB: [birth year] DOA: 02/16/2016 PCP: Myrlene Broker, MD  HPI/Recap of past 24 hours:  Feeling better, pain has resolved, no n/v,  eport one time diarrhea since admitted to the hospital Husband in room  Assessment/Plan: Active Problems:   Essential hypertension   RUQ abdominal pain   Chest pain  abdominal pain, pain has resolved, but with acute lft elevation Patient report patient started around 11pm radiates into her stomach, and to her back. CTA chest abdomen on admission no evidence of acute aortic dissection, does show "Mildly prominent extrahepatic common bile duct . No calcified stones are visualized. There is questionable gallbladder wall thickening versus contraction Abdominal US: 1. No sonographic evidence for cholelithiasis or acute Cholecystitis. 2. Borderline dilated common bile duct measuring slightly greater than 6 mm. Consider correlation with laboratory values to assess for Obstruction.  HIDA scan: Filling of gallbladder is noted. No definite evidence of cystic duct obstruction.  lft worsening compare to labs obtained initially, lipase wnl She is npo, [facility] consulted. Diet per GI.  Leukocytosis/lactic acidosis on admission no fever, she report she is started on macrobid yesterday to uti with dysuria, repeat ua unremarkable, but will start cipro/flagyl to cover both uti and possible gi source. Start maintenance ivf, Home meds lasix held, start gentle hydration with ns/k due to npo  Chest pain?  CTA chest no acute findings, No acute EKG changes, continue cycle troponin, first set negative, echo pending, check lipid panel and a1c Keep on tele, continue ppi  HTN: continue home meds norvasc  Impaired fasting blood glucose: a1c pending, currently npo  Recurrent uti: repeat ua on admission unremarkable.  Code Status: full  Family Communication: patient and husband at bedside  Disposition Plan: home with gi  clearance in 24-48hrs   Consultants:  [facility]  Procedures:  CTA  HIDA  Antibiotics:  Cipro/flagyl from 11/29   Objective: BP (!) 150/67 (BP Location: Left Arm)   Pulse 93   Temp 98.7 F (37.1 C) (Oral)   Resp 16   LMP 03/21/1983   SpO2 96%   Intake/Output Summary (Last 24 hours) at 02/16/16 1031 Last data filed at 02/16/16 0243  Gross per 24 hour  Intake             1000 ml  Output                0 ml  Net             1000 ml   There were no vitals filed for this visit.  Exam:   General:  NAD  Cardiovascular: RRR, mild systolic murmur best heard at left upper sternal border  Respiratory: CTABL  Abdomen: Soft/ND/NT, positive BS  Musculoskeletal: No Edema  Neuro: aaox3  Data Reviewed: Basic Metabolic Panel:  Recent Labs Lab 02/16/16 0039 02/16/16 0051 02/16/16 0733  NA 139 141 139  K 3.1* 3.1* 4.3  CL 105 102 105  CO2 24  --  25  GLUCOSE 177* 175* 111*  BUN 21* 22* 17  CREATININE 0.86 0.90 0.67  CALCIUM 9.4  --  9.2   Liver Function Tests:  Recent Labs Lab 02/16/16 0039 02/16/16 0733  AST 100* 767*  ALT 39 427*  ALKPHOS 137* 145*  BILITOT 0.6 1.5*  PROT 7.0 6.9  ALBUMIN 4.3 4.6    Recent Labs Lab 02/16/16 0039 02/16/16 0733  LIPASE 48 44   No results for input(s): AMMONIA in the last 168 hours.  CBC:  Recent Labs Lab 02/16/16 0039 02/16/16 0051 02/16/16 0733  WBC 13.7*  --  11.0*  NEUTROABS 10.8*  --  10.1*  HGB 13.3 13.9 13.1  HCT 40.1 41.0 39.5  MCV 86.6  --  87.2  PLT 294  --  280   Cardiac Enzymes:    Recent Labs Lab 02/16/16 0733  TROPONINI <0.03   BNP (last 3 results) No results for input(s): BNP in the last 8760 hours.  ProBNP (last 3 results) No results for input(s): PROBNP in the last 8760 hours.  CBG: No results for input(s): GLUCAP in the last 168 hours.  No results found for this or any previous visit (from the past 240 hour(s)).   Studies: Dg Chest 2 View  Result Date:  02/16/2016 CLINICAL DATA:  Chest pain EXAM: CHEST  2 VIEW COMPARISON:  Chest radiograph 5242 FINDINGS: Shallow lung inflation. Cardiomediastinal contours are normal. No pneumothorax or pleural effusion.No focal airspace consolidation or pulmonary edema. IMPRESSION: No active cardiopulmonary disease. Electronically Signed   By: Deatra RobinsonKevin  Herman M.D.   On: 02/16/2016 01:24   Ct Angio Chest/abd/pel For Dissection W And/or Wo Contrast  Result Date: 02/16/2016 CLINICAL DATA:  Chest pain going to the back EXAM: CT ANGIOGRAPHY CHEST, ABDOMEN AND PELVIS TECHNIQUE: Multidetector CT imaging through the chest, abdomen and pelvis was performed using the standard protocol during bolus administration of intravenous contrast. Multiplanar reconstructed images and MIPs were obtained and reviewed to evaluate the vascular anatomy. CONTRAST:  80 mL Isovue 370 intravenous COMPARISON:  None. FINDINGS: CTA CHEST FINDINGS Cardiovascular: Inadequate opacification of the pulmonary arterial system to assess for acute pulmonary embolus. There is no evidence for aortic dissection. Ascending aortic diameter is 3.2 cm. Mild atherosclerosis of the aortic arch. Normal 3 vessel origin from the aortic arch which is left-sided. Great vessels are widely patent. Heart size is within normal limits. No pericardial effusion. Mediastinum/Nodes: Multiple sub cm hypodense nodules in the thyroid gland. Trachea and mainstem bronchi appear normal. No significantly enlarged mediastinal or hilar lymph nodes. The esophagus is within normal limits. Lungs/Pleura: Lungs are clear. No pleural effusion or pneumothorax. Musculoskeletal: No chest wall abnormality. No acute or significant osseous findings. Review of the MIP images confirms the above findings. CTA ABDOMEN AND PELVIS FINDINGS VASCULAR Aorta: Aorta is non aneurysmal. Scattered atherosclerotic calcification and mild mural thrombus. No dissection is seen. 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 Distal vessels are widely patent. SMA: Patent Renals: Single left renal artery which is widely patent. There are 2 right renal arteries, the superior artery is diminutive throughout its course and there is possible mild to moderate focal stenosis at its origin. The more inferior dominant artery demonstrates mild distal narrowing but is otherwise patent. IMA: Patent Inflow: No significant stenosis within the distal abdominal aorta or iliac vessels. External iliac arteries are patent. Veins: Limited evaluation due to arterial phase study. Review of the MIP images confirms the above findings. NON-VASCULAR Hepatobiliary: No focal hepatic abnormality. No calcified gallstones. Mildly prominent extrahepatic common bile duct measuring up to 8 mm. Questionable mild wall thickening versus contraction of the gallbladder. Pancreas: Unremarkable. No pancreatic ductal dilatation or surrounding inflammatory changes. Spleen: Normal in size without focal abnormality. Adrenals/Urinary Tract: Adrenal glands are unremarkable. Kidneys are normal, without renal calculi, focal lesion, or hydronephrosis. Bladder is unremarkable. Stomach/Bowel: Stomach is mildly distended. No dilated small bowel. Post appendectomy changes. No colon wall thickening. Lymphatic: No significantly enlarged abdominal  or pelvic lymph nodes. Reproductive: Status post hysterectomy. No adnexal masses. Other: No free air or free fluid Musculoskeletal: No acute osseous abnormality. Degenerative changes most notable at L2-L3 were there is mild retrolisthesis of L2 on L3. Review of the MIP images confirms the above findings. IMPRESSION: 1. No CT evidence for acute aortic dissection. 2. Multiple sub cm hypodense nodules in the thyroid gland. 3. Mildly prominent extrahepatic common bile duct . No calcified stones are visualized. There is questionable gallbladder wall thickening versus contraction. If symptoms suggest gallbladder  disease, correlation with ultrasound may be obtained. Electronically Signed   By: Jasmine PangKim  Fujinaga M.D.   On: 02/16/2016 01:50   [place] Abdomen Limited Ruq  Result Date: 02/16/2016 CLINICAL DATA:  Right upper quadrant pain EXAM: US ABDOMEN LIMITED - RIGHT UPPER QUADRANT COMPARISON:  CTA 02/16/2016 FINDINGS: Gallbladder: No gallstones or wall thickening visualized. No sonographic Murphy sign noted by sonographer. Common bile duct: Diameter: Borderline dilated at 6.6 mm maximum. Liver: No focal lesion identified. Within normal limits in parenchymal echogenicity. IMPRESSION: 1. No sonographic evidence for cholelithiasis or acute cholecystitis. 2. Borderline dilated common bile duct measuring slightly greater than 6 mm. Consider correlation with laboratory values to assess for obstruction. Electronically Signed   By: Jasmine PangKim  Fujinaga M.D.   On: 02/16/2016 03:08    Scheduled Meds: . amLODipine  10 mg Oral Daily  . aspirin EC  325 mg Oral Daily  .  morphine injection  3 mg Intravenous Once  . pantoprazole  40 mg Oral Daily    Continuous Infusions:   Time spent: 35 mins from 12 noon to 12:35pm  Franciscojavier Wronski MD, PhD  [facility] Pager [phone]. If 7PM-7AM, please contact night-coverage at [email or web address], password [email or web address] 02/16/2016, 10:31 AM  LOS: 0 days

## 2016-02-16 NOTE — ED Notes (Signed)
US Abdomen in process at bedside at this time. 

## 2016-02-16 NOTE — ED Provider Notes (Signed)
[facility]-EMERGENCY DEPT Provider Note   CSN: [id number] Arrival date & time: 02/15/16  2356   By signing my name below, I, Nelwyn SalisburyJoshua Fowler, attest that this documentation has been prepared under the direction and in the presence of Tomasita CrumbleAdeleke Keylah Darwish, MD . Electronically Signed: Nelwyn SalisburyJoshua Fowler, Scribe. 02/16/2016. 12:29 AM.  History   Chief Complaint No chief complaint on file.  The history is provided by the patient. No language interpreter was used.   HPI Comments:  Kaylee Davis is a 75 y.o. female with recent pmhx of UTI on macrobid who presents to the Emergency Department complaining of sudden-onset constant unchanged chest pain beginning about an hour ago. Pt describes her symptoms as a cramping pressure-like pain that radiates into her stomach and through to her back. She reports associated diarrhea and weakness. Pt denies any fevers, cough, or runny nose. She also denies any history of aneurysm. Pt has a pshx of hysterectomy and appendectomy.  Past Medical History:  Diagnosis Date  . Arthritis    OA BOTH KNEES; OCCAS PAIN LEFT SHOULDER - HX OF ROTATOR CUFF PROBLEM  . Dyspepsia   . GERD (gastroesophageal reflux disease)   . GI bleed    GI BLEED FROM DUODENAL ULCER--ABOUT 17 YRS AGO - GIVEN TRANSFUSIONS  . History of blood transfusion    1990's  . History of frequent urinary tract infections   . History of PSVT (paroxysmal supraventricular tachycardia)    can control by doing a valsalva. Triggered by elevated heart rate  . Hypertension   . Imbalance   . Migraine headache    much reduced frequency  . Tachycardia    Episodic  . Tingling    left foot   . TMJ (temporomandibular joint disorder)    PAST HX GRINDING TEETH --BUT NO LONGER A PROBLEM  . Wears glasses     Patient Active Problem List   Diagnosis Date Noted  . OA (osteoarthritis) of knee 05/28/2015  . Dysuria 09/01/2014  . Routine health maintenance 02/03/2011  . Osteoarthritis 12/13/2009  . Essential hypertension  02/26/2007  . PSVT 02/26/2007  . GERD (gastroesophageal reflux disease) 02/26/2007    Past Surgical History:  Procedure Laterality Date  . ABDOMINAL HYSTERECTOMY  1985   TAH/BSO--for ?fibroids  . APPENDECTOMY    . ARTHROPLASTY     left Knee   . cyst removed      from mouth  . KNEE ARTHROSCOPY Left 10/2005  . LAPAROSCOPIC APPENDECTOMY  02/2000  . TONSILLECTOMY    . 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 TOTAL KNEE ARTHROPLASTY Right 05/28/2015   Procedure: RIGHT TOTAL KNEE ARTHROPLASTY;  Surgeon: Ollen GrossFrank Aluisio, MD;  Location: [facility] ORS;  Service: Orthopedics;  Laterality: Right;  . WISDOM TOOTH EXTRACTION  2008    OB History    Gravida Para Term Preterm AB Living   2 2 2     2    SAB TAB Ectopic Multiple Live Births                   Home Medications    Prior to Admission medications   Medication Sig Start Date End Date Taking? Authorizing Provider  amLODipine (NORVASC) 10 MG tablet TAKE 1 BY MOUTH EVERY MORNING 05/20/15  Yes Myrlene BrokerElizabeth A Crawford, MD  esomeprazole (NEXIUM) 20 MG capsule Take 1 capsule (20 mg total) by mouth every morning. 05/20/15  Yes Myrlene BrokerElizabeth A Crawford, MD  furosemide (LASIX) 40 MG tablet TAKE 1 BY MOUTH DAILY 05/20/15  Yes Myrlene BrokerElizabeth A Crawford, MD  nitrofurantoin (MACRODANTIN) 100 MG capsule Take 1 capsule (100 mg total) by mouth 2 (two) times daily. 02/15/16  Yes Myrlene BrokerElizabeth A Crawford, MD    Family History Family History  Problem Relation Age of Onset  . Cancer Neg Hx     Breast/Colon  . Diabetes Neg Hx   . Heart disease Neg Hx     CAD/MI    Social History Social History  Substance Use Topics  . Smoking status: Never Smoker  . Smokeless tobacco: Never Used  . Alcohol use No     Comment: no      Allergies   Other; Penicillins; and Sulfonamide derivatives   Review of Systems Review of Systems 10 Systems  reviewed and are negative for acute change except as noted in the HPI.  Physical Exam Updated Vital Signs BP 138/66   Pulse 84   Temp 98.3 F (36.8 C) (Oral)   Resp 12   LMP 03/21/1983   SpO2 96%   Physical Exam  Constitutional: She is oriented to person, place, and time. She appears well-developed and well-nourished. She appears distressed.  HENT:  Head: Normocephalic and atraumatic.  Nose: Nose normal.  Mouth/Throat: Oropharynx is clear and moist. No oropharyngeal exudate.  Eyes: Conjunctivae and EOM are normal. Pupils are equal, round, and reactive to light. No scleral icterus.  Neck: Normal range of motion. Neck supple. No JVD present. No tracheal deviation present. No thyromegaly present.  Cardiovascular: Normal rate, regular rhythm and normal heart sounds.  Exam reveals no gallop and no friction rub.   No murmur heard. Pulmonary/Chest: Effort normal and breath sounds normal. No respiratory distress. She has no wheezes. She exhibits no tenderness.  Abdominal: Soft. Bowel sounds are normal. She exhibits no distension and no mass. There is tenderness. There is no rebound and no guarding.  RUQ tenderness to palpation with positive Murphy sign  Musculoskeletal: Normal range of motion. She exhibits no edema or tenderness.  Lymphadenopathy:    She has no cervical adenopathy.  Neurological: She is alert and oriented to person, place, and time. No cranial nerve deficit. She exhibits normal muscle tone.  Skin: Skin is warm and dry. No rash noted. No erythema. No pallor.  Nursing note and vitals reviewed.    ED Treatments / Results  DIAGNOSTIC STUDIES:  Oxygen Saturation is 98% on RA, normal by my interpretation.    COORDINATION OF CARE:  12:33 AM Discussed treatment plan with pt at bedside which includes imaging and pt agreed to plan.  Labs (all labs ordered are listed, but only abnormal results are displayed) Labs Reviewed  CBC WITH DIFFERENTIAL/PLATELET - Abnormal; Notable  for the following:       Result Value   WBC 13.7 (*)    Neutro Abs 10.8 (*)    All other components within normal limits  COMPREHENSIVE METABOLIC PANEL - Abnormal; Notable for the following:    Potassium 3.1 (*)    Glucose, Bld 177 (*)    BUN 21 (*)    AST 100 (*)    Alkaline Phosphatase 137 (*)    All other components within normal limits  I-STAT CHEM 8, ED - Abnormal; Notable for the following:    Potassium 3.1 (*)    BUN 22 (*)    Glucose, Bld 175 (*)    Calcium, Ion 1.14 (*)    All  other components within normal limits  I-STAT CG4 LACTIC ACID, ED - Abnormal; Notable for the following:    Lactic Acid, Venous 2.57 (*)    All other components within normal limits  URINE CULTURE  LIPASE, BLOOD  PROTIME-INR  URINALYSIS, ROUTINE W REFLEX MICROSCOPIC (NOT AT [facility])  I-STAT TROPOININ, ED  I-STAT TROPOININ, ED    EKG  EKG Interpretation  Date/Time:  Wednesday February 16 2016 00:01:23 EST Ventricular Rate:  92 PR Interval:    QRS Duration: 93 QT Interval:  368 QTC Calculation: 456 R Axis:   69 Text Interpretation:  Sinus rhythm Prolonged PR interval Abnormal R-wave progression, early transition Baseline wander in lead(s) V5 No significant change since last tracing Confirmed by Erroll Luna [phone]) on 02/16/2016 12:18:30 AM       Radiology Dg Chest 2 View  Result Date: 02/16/2016 CLINICAL DATA:  Chest pain EXAM: CHEST  2 VIEW COMPARISON:  Chest radiograph [id number] FINDINGS: Shallow lung inflation. Cardiomediastinal contours are normal. No pneumothorax or pleural effusion.No focal airspace consolidation or pulmonary edema. IMPRESSION: No active cardiopulmonary disease. Electronically Signed   By: Deatra Robinson M.D.   On: 02/16/2016 01:24   Ct Angio Chest/abd/pel For Dissection W And/or Wo Contrast  Result Date: 02/16/2016 CLINICAL DATA:  Chest pain going to the back EXAM: CT ANGIOGRAPHY CHEST, ABDOMEN AND PELVIS TECHNIQUE: Multidetector CT imaging through the chest,  abdomen and pelvis was performed using the standard protocol during bolus administration of intravenous contrast. Multiplanar reconstructed images and MIPs were obtained and reviewed to evaluate the vascular anatomy. CONTRAST:  80 mL Isovue 370 intravenous COMPARISON:  None. FINDINGS: CTA CHEST FINDINGS Cardiovascular: Inadequate opacification of the pulmonary arterial system to assess for acute pulmonary embolus. There is no evidence for aortic dissection. Ascending aortic diameter is 3.2 cm. Mild atherosclerosis of the aortic arch. Normal 3 vessel origin from the aortic arch which is left-sided. Great vessels are widely patent. Heart size is within normal limits. No pericardial effusion. Mediastinum/Nodes: Multiple sub cm hypodense nodules in the thyroid gland. Trachea and mainstem bronchi appear normal. No significantly enlarged mediastinal or hilar lymph nodes. The esophagus is within normal limits. Lungs/Pleura: Lungs are clear. No pleural effusion or pneumothorax. Musculoskeletal: No chest wall abnormality. No acute or significant osseous findings. Review of the MIP images confirms the above findings. CTA ABDOMEN AND PELVIS FINDINGS VASCULAR Aorta: Aorta is non aneurysmal. Scattered atherosclerotic calcification and mild mural thrombus. No dissection is seen. 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 Distal vessels are widely patent. SMA: Patent Renals: Single left renal artery which is widely patent. There are 2 right renal arteries, the superior artery is diminutive throughout its course and there is possible mild to moderate focal stenosis at its origin. The more inferior dominant artery demonstrates mild distal narrowing but is otherwise patent. IMA: Patent Inflow: No significant stenosis within the distal abdominal aorta or iliac vessels. External iliac arteries are patent. Veins: Limited evaluation due to arterial phase study. Review of the MIP  images confirms the above findings. NON-VASCULAR Hepatobiliary: No focal hepatic abnormality. No calcified gallstones. Mildly prominent extrahepatic common bile duct measuring up to 8 mm. Questionable mild wall thickening versus contraction of the gallbladder. Pancreas: Unremarkable. No pancreatic ductal dilatation or surrounding inflammatory changes. Spleen: Normal in size without focal abnormality. Adrenals/Urinary Tract: Adrenal glands are unremarkable. Kidneys are normal, without renal calculi, focal lesion, or hydronephrosis. Bladder is unremarkable. Stomach/Bowel: Stomach is mildly distended. No  dilated small bowel. Post appendectomy changes. No colon wall thickening. Lymphatic: No significantly enlarged abdominal or pelvic lymph nodes. Reproductive: Status post hysterectomy. No adnexal masses. Other: No free air or free fluid Musculoskeletal: No acute osseous abnormality. Degenerative changes most notable at L2-L3 were there is mild retrolisthesis of L2 on L3. Review of the MIP images confirms the above findings. IMPRESSION: 1. No CT evidence for acute aortic dissection. 2. Multiple sub cm hypodense nodules in the thyroid gland. 3. Mildly prominent extrahepatic common bile duct . No calcified stones are visualized. There is questionable gallbladder wall thickening versus contraction. If symptoms suggest gallbladder disease, correlation with ultrasound may be obtained. Electronically Signed   By: Jasmine Pang M.D.   On: 02/16/2016 01:50   US Abdomen Limited Ruq  Result Date: 02/16/2016 CLINICAL DATA:  Right upper quadrant pain EXAM: US ABDOMEN LIMITED - RIGHT UPPER QUADRANT COMPARISON:  CTA 02/16/2016 FINDINGS: Gallbladder: No gallstones or wall thickening visualized. No sonographic Murphy sign noted by sonographer. Common bile duct: Diameter: Borderline dilated at 6.6 mm maximum. Liver: No focal lesion identified. Within normal limits in parenchymal echogenicity. IMPRESSION: 1. No sonographic evidence  for cholelithiasis or acute cholecystitis. 2. Borderline dilated common bile duct measuring slightly greater than 6 mm. Consider correlation with laboratory values to assess for obstruction. Electronically Signed   By: Jasmine Pang M.D.   On: 02/16/2016 03:08    Procedures Procedures (including critical care time)  Medications Ordered in ED Medications  nitroGLYCERIN (NITROSTAT) SL tablet 0.4 mg (not administered)  sodium chloride 0.9 % bolus 1,000 mL (0 mLs Intravenous Stopped 02/16/16 0243)  morphine 4 MG/ML injection 6 mg (6 mg Intravenous Given 02/16/16 0127)  iopamidol (ISOVUE-370) 76 % injection 100 mL (80 mLs Intravenous Contrast Given 02/16/16 0110)  potassium chloride SA (K-DUR,KLOR-CON) CR tablet 40 mEq (40 mEq Oral Given 02/16/16 0129)  ondansetron (ZOFRAN) injection 4 mg (4 mg Intravenous Given 02/16/16 0132)     Initial Impression / Assessment and Plan / ED Course  I have reviewed the triage vital signs and the nursing notes.  Pertinent labs & imaging results that were available during my care of the patient were reviewed by me and considered in my medical decision making (see chart for details).  Clinical Course     Patient presents to the ED for sudden onset chest, back and abdominal pain.  She appears in acute distress and I am concern for possible aortic emergency. Will obtain stat CTA for evaluation.  EKG does not show STEMI, troponin is also pending.  4:10 AM CT neg for dissection.  Show gb wall thickening.  I obtained RUQ [place] which shows possible obstruction and dilated CBD.  LFTs are up as well.  Patient continues to have RUQ pain.    I have concern that this obstruction will get worse.  IT was be safest for the patient to be admitted and evaluated by GI for possible ERCP. Dr. Toniann Fail accepts for further care.    Final Clinical Impressions(s) / ED Diagnoses   Final diagnoses:  RUQ abdominal pain    New Prescriptions New Prescriptions   No medications on  file     I personally performed the services described in this documentation, which was scribed in my presence. The recorded information has been reviewed and is accurate.       Tomasita Crumble, MD 02/16/16 [phone]

## 2016-02-16 NOTE — H&P (Signed)
History and Physical    Kaylee Davis ZOX:[id number] DOB: Feb 17, 1941 DOA: 02/16/2016  PCP: Myrlene BrokerElizabeth A Crawford, MD  Patient coming from: Home.  Chief Complaint: Chest pain.  HPI: Kaylee Davis is a 75 y.o. female with history of hypertension, peptic ulcer disease started experiencing chest pain last night around 11 PM radiating to her back. Pain lasted until patient reached ER and received pain relief medications. On exam patient had right upper quadrant tenderness. AST was mildly elevated. Bilirubin was normal. Cardiac markers and EKG were unremarkable. Since pain radiated to the back patient had CT angiogram of the chest abdomen and pelvis which was negative for dissection. There were some concerns for gallbladder issue. Sonogram of abdomen was done which shows dilated bile duct. Patient is being admitted for further management of chest pain and also right upper quadrant tenderness.   Patient was placed on Macrodantin yesterday by patient's PCP for UTI.  ED Course: CT angiogram of the chest abdomen and pelvis was negative for dissection. Sonogram of abdomen shows dilated common bile duct. Troponin negative.  Review of Systems: As per HPI, rest all negative.   Past Medical History:  Diagnosis Date  . Arthritis    OA BOTH KNEES; OCCAS PAIN LEFT SHOULDER - HX OF ROTATOR CUFF PROBLEM  . Dyspepsia   . GERD (gastroesophageal reflux disease)   . GI bleed    GI BLEED FROM DUODENAL ULCER--ABOUT 17 YRS AGO - GIVEN TRANSFUSIONS  . History of blood transfusion    1990's  . History of frequent urinary tract infections   . History of PSVT (paroxysmal supraventricular tachycardia)    can control by doing a valsalva. Triggered by elevated heart rate  . Hypertension   . Imbalance   . Migraine headache    much reduced frequency  . Tachycardia    Episodic  . Tingling    left foot   . TMJ (temporomandibular joint disorder)    PAST HX GRINDING TEETH --BUT NO LONGER A PROBLEM  .  Wears glasses     Past Surgical History:  Procedure Laterality Date  . ABDOMINAL HYSTERECTOMY  1985   TAH/BSO--for ?fibroids  . APPENDECTOMY    . ARTHROPLASTY     left Knee   . cyst removed      from mouth  . KNEE ARTHROSCOPY Left 10/2005  . LAPAROSCOPIC APPENDECTOMY  02/2000  . TONSILLECTOMY    . 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 TOTAL KNEE ARTHROPLASTY Right 05/28/2015   Procedure: RIGHT TOTAL KNEE ARTHROPLASTY;  Surgeon: Ollen GrossFrank Aluisio, MD;  Location: [facility];  Service: Orthopedics;  Laterality: Right;  . WISDOM TOOTH EXTRACTION  2008     reports that she has never smoked. She has never used smokeless tobacco. She reports that she does not drink alcohol or use drugs.  Allergies  Allergen Reactions  . Other     PT STATES THE SULFA AND PENICILLIN SHE TOOK MANY YEARS AGO BOTH HAD RED DYES--SO SHE WONDERED IF ALLERGIC TO RED DYES  . Penicillins Hives    Has had penicillin since and has been fine.    . Sulfonamide Derivatives Hives    Family History  Problem Relation Age of Onset  . Cancer Neg Hx     Breast/Colon  . Diabetes Neg Hx   . Heart disease Neg Hx     CAD/MI  Prior to Admission medications   Medication Sig Start Date End Date Taking? Authorizing Provider  amLODipine (NORVASC) 10 MG tablet TAKE 1 BY MOUTH EVERY MORNING 05/20/15  Yes Myrlene Broker, MD  esomeprazole (NEXIUM) 20 MG capsule Take 1 capsule (20 mg total) by mouth every morning. 05/20/15  Yes Myrlene Broker, MD  furosemide (LASIX) 40 MG tablet TAKE 1 BY MOUTH DAILY 05/20/15  Yes Myrlene Broker, MD  nitrofurantoin (MACRODANTIN) 100 MG capsule Take 1 capsule (100 mg total) by mouth 2 (two) times daily. 02/15/16  Yes Myrlene Broker, MD    Physical Exam: Vitals:   02/16/16 0430 02/16/16 0500 02/16/16 0530 02/16/16 0627  BP: 167/68 136/68 (!)  120/50 (!) 141/68  Pulse:    83  Resp: 17 17 19 16   Temp:    98.3 F (36.8 C)  TempSrc:    Oral  SpO2:  95% 93% 100%      Constitutional: Moderately built and nourished. Vitals:   02/16/16 0430 02/16/16 0500 02/16/16 0530 02/16/16 0627  BP: 167/68 136/68 (!) 120/50 (!) 141/68  Pulse:    83  Resp: 17 17 19 16   Temp:    98.3 F (36.8 C)  TempSrc:    Oral  SpO2:  95% 93% 100%   Eyes: Anicteric no pallor. ENMT: No discharge from the ears eyes nose or mouth. Neck: No masses felt and no neck rigidity. Respiratory: No rhonchi or crepitations. Cardiovascular: S1 and S2 heard. No murmurs appreciated. Abdomen: Right upper quadrant epigastric tenderness. No guarding or rigidity. Musculoskeletal: No edema. Skin: No rash. Skin appears warm. Neurologic: Alert awake oriented to time place and person. Moves all extremities. Psychiatric: Appears normal. Normal affect.   Labs on Admission: I have personally reviewed following labs and imaging studies  CBC:  Recent Labs Lab 02/16/16 0039 02/16/16 0051  WBC 13.7*  --   NEUTROABS 10.8*  --   HGB 13.3 13.9  HCT 40.1 41.0  MCV 86.6  --   PLT 294  --    Basic Metabolic Panel:  Recent Labs Lab 02/16/16 0039 02/16/16 0051  NA 139 141  K 3.1* 3.1*  CL 105 102  CO2 24  --   GLUCOSE 177* 175*  BUN 21* 22*  CREATININE 0.86 0.90  CALCIUM 9.4  --    GFR: Estimated Creatinine Clearance: 50.9 mL/min (by C-G formula based on SCr of 0.9 mg/dL). Liver Function Tests:  Recent Labs Lab 02/16/16 0039  AST 100*  ALT 39  ALKPHOS 137*  BILITOT 0.6  PROT 7.0  ALBUMIN 4.3    Recent Labs Lab 02/16/16 0039  LIPASE 48   No results for input(s): AMMONIA in the last 168 hours. Coagulation Profile:  Recent Labs Lab 02/16/16 0039  INR 0.87   Cardiac Enzymes: No results for input(s): CKTOTAL, CKMB, CKMBINDEX, TROPONINI in the last 168 hours. BNP (last 3 results) No results for input(s): PROBNP in the last 8760  hours. HbA1C: No results for input(s): HGBA1C in the last 72 hours. CBG: No results for input(s): GLUCAP in the last 168 hours. Lipid Profile: No results for input(s): CHOL, HDL, LDLCALC, TRIG, CHOLHDL, LDLDIRECT in the last 72 hours. Thyroid Function Tests: No results for input(s): TSH, T4TOTAL, FREET4, T3FREE, THYROIDAB in the last 72 hours. Anemia Panel: No results for input(s): VITAMINB12, FOLATE, FERRITIN, TIBC, IRON, RETICCTPCT in the last 72 hours. Urine analysis:    Component Value Date/Time   COLORURINE YELLOW 02/16/2016 0154   APPEARANCEUR CLEAR 02/16/2016  0154   LABSPEC 1.018 02/16/2016 0154   PHURINE 7.5 02/16/2016 0154   GLUCOSEU NEGATIVE 02/16/2016 0154   HGBUR NEGATIVE 02/16/2016 0154   BILIRUBINUR NEGATIVE 02/16/2016 0154   BILIRUBINUR neg 02/15/2016 1134   KETONESUR NEGATIVE 02/16/2016 0154   PROTEINUR NEGATIVE 02/16/2016 0154   UROBILINOGEN negative 02/15/2016 1134   UROBILINOGEN 0.2 07/19/2012 0934   NITRITE NEGATIVE 02/16/2016 0154   LEUKOCYTESUR NEGATIVE 02/16/2016 0154   Sepsis Labs: @LABRCNTIP (procalcitonin:4,lacticidven:4) )No results found for this or any previous visit (from the past 240 hour(s)).   Radiological Exams on Admission: Dg Chest 2 View  Result Date: 02/16/2016 CLINICAL DATA:  Chest pain EXAM: CHEST  2 VIEW COMPARISON:  Chest radiograph 5242 FINDINGS: Shallow lung inflation. Cardiomediastinal contours are normal. No pneumothorax or pleural effusion.No focal airspace consolidation or pulmonary edema. IMPRESSION: No active cardiopulmonary disease. Electronically Signed   By: Deatra Robinson M.D.   On: 02/16/2016 01:24   Ct Angio Chest/abd/pel For Dissection W And/or Wo Contrast  Result Date: 02/16/2016 CLINICAL DATA:  Chest pain going to the back EXAM: CT ANGIOGRAPHY CHEST, ABDOMEN AND PELVIS TECHNIQUE: Multidetector CT imaging through the chest, abdomen and pelvis was performed using the standard protocol during bolus administration of  intravenous contrast. Multiplanar reconstructed images and MIPs were obtained and reviewed to evaluate the vascular anatomy. CONTRAST:  80 mL Isovue 370 intravenous COMPARISON:  None. FINDINGS: CTA CHEST FINDINGS Cardiovascular: Inadequate opacification of the pulmonary arterial system to assess for acute pulmonary embolus. There is no evidence for aortic dissection. Ascending aortic diameter is 3.2 cm. Mild atherosclerosis of the aortic arch. Normal 3 vessel origin from the aortic arch which is left-sided. Great vessels are widely patent. Heart size is within normal limits. No pericardial effusion. Mediastinum/Nodes: Multiple sub cm hypodense nodules in the thyroid gland. Trachea and mainstem bronchi appear normal. No significantly enlarged mediastinal or hilar lymph nodes. The esophagus is within normal limits. Lungs/Pleura: Lungs are clear. No pleural effusion or pneumothorax. Musculoskeletal: No chest wall abnormality. No acute or significant osseous findings. Review of the MIP images confirms the above findings. CTA ABDOMEN AND PELVIS FINDINGS VASCULAR Aorta: Aorta is non aneurysmal. Scattered atherosclerotic calcification and mild mural thrombus. No dissection is seen. 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 Distal vessels are widely patent. SMA: Patent Renals: Single left renal artery which is widely patent. There are 2 right renal arteries, the superior artery is diminutive throughout its course and there is possible mild to moderate focal stenosis at its origin. The more inferior dominant artery demonstrates mild distal narrowing but is otherwise patent. IMA: Patent Inflow: No significant stenosis within the distal abdominal aorta or iliac vessels. External iliac arteries are patent. Veins: Limited evaluation due to arterial phase study. Review of the MIP images confirms the above findings. NON-VASCULAR Hepatobiliary: No focal hepatic abnormality. No  calcified gallstones. Mildly prominent extrahepatic common bile duct measuring up to 8 mm. Questionable mild wall thickening versus contraction of the gallbladder. Pancreas: Unremarkable. No pancreatic ductal dilatation or surrounding inflammatory changes. Spleen: Normal in size without focal abnormality. Adrenals/Urinary Tract: Adrenal glands are unremarkable. Kidneys are normal, without renal calculi, focal lesion, or hydronephrosis. Bladder is unremarkable. Stomach/Bowel: Stomach is mildly distended. No dilated small bowel. Post appendectomy changes. No colon wall thickening. Lymphatic: No significantly enlarged abdominal or pelvic lymph nodes. Reproductive: Status post hysterectomy. No adnexal masses. Other: No free air or free fluid Musculoskeletal: No acute osseous abnormality. Degenerative changes most notable  at L2-L3 were there is mild retrolisthesis of L2 on L3. Review of the MIP images confirms the above findings. IMPRESSION: 1. No CT evidence for acute aortic dissection. 2. Multiple sub cm hypodense nodules in the thyroid gland. 3. Mildly prominent extrahepatic common bile duct . No calcified stones are visualized. There is questionable gallbladder wall thickening versus contraction. If symptoms suggest gallbladder disease, correlation with ultrasound may be obtained. Electronically Signed   By: Jasmine PangKim  Fujinaga M.D.   On: 02/16/2016 01:50   [place] Abdomen Limited Ruq  Result Date: 02/16/2016 CLINICAL DATA:  Right upper quadrant pain EXAM: US ABDOMEN LIMITED - RIGHT UPPER QUADRANT COMPARISON:  CTA 02/16/2016 FINDINGS: Gallbladder: No gallstones or wall thickening visualized. No sonographic Murphy sign noted by sonographer. Common bile duct: Diameter: Borderline dilated at 6.6 mm maximum. Liver: No focal lesion identified. Within normal limits in parenchymal echogenicity. IMPRESSION: 1. No sonographic evidence for cholelithiasis or acute cholecystitis. 2. Borderline dilated common bile duct measuring  slightly greater than 6 mm. Consider correlation with laboratory values to assess for obstruction. Electronically Signed   By: Jasmine PangKim  Fujinaga M.D.   On: 02/16/2016 03:08    EKG: Independently reviewed. Normal sinus rhythm.  Assessment/Plan Active Problems:   Essential hypertension   RUQ abdominal pain   Chest pain    1. Chest pain and right upper quadrant tenderness - differential includes cardiac versus gallbladder issues versus peptic ulcer disease. Will cycle cardiac markers check 2-D echo and also HIDA scan. Keep patient on Pepcid. 2. Hypertension - continue home medication amlodipine. 3. Hyperglycemia - check hemoglobin A1c. 4. Recently treated UTI - UA looks unremarkable.   DVT prophylaxis: SCDs. Code Status: Full code.  Family Communication: Discussed with patient's husband.  Disposition Plan: Home.  Consults called: None.  Admission status: Observation.    Eduard ClosKAKRAKANDY,Mahrosh Donnell N. MD [facility] Pager [phone].  If 7PM-7AM, please contact night-coverage [email or web address] Password [email or web address]  02/16/2016, 6:54 AM

## 2016-02-16 NOTE — ED Notes (Signed)
Informed Dr. Mora Bellmanni of lactic acid of 2.57

## 2016-02-17 ENCOUNTER — Observation Stay (HOSPITAL_COMMUNITY): Payer: Medicare Other

## 2016-02-17 ENCOUNTER — Observation Stay (HOSPITAL_BASED_OUTPATIENT_CLINIC_OR_DEPARTMENT_OTHER): Payer: Medicare Other

## 2016-02-17 ENCOUNTER — Encounter: Payer: Self-pay | Admitting: Nurse Practitioner

## 2016-02-17 DIAGNOSIS — R1011 Right upper quadrant pain: Secondary | ICD-10-CM | POA: Diagnosis not present

## 2016-02-17 DIAGNOSIS — R0789 Other chest pain: Secondary | ICD-10-CM | POA: Diagnosis not present

## 2016-02-17 DIAGNOSIS — R7989 Other specified abnormal findings of blood chemistry: Secondary | ICD-10-CM

## 2016-02-17 DIAGNOSIS — R079 Chest pain, unspecified: Secondary | ICD-10-CM

## 2016-02-17 DIAGNOSIS — R935 Abnormal findings on diagnostic imaging of other abdominal regions, including retroperitoneum: Secondary | ICD-10-CM | POA: Diagnosis not present

## 2016-02-17 LAB — CBC
HCT: 38.2 % (ref 36.0–46.0)
Hemoglobin: 12.5 g/dL (ref 12.0–15.0)
MCH: 29.1 pg (ref 26.0–34.0)
MCHC: 32.7 g/dL (ref 30.0–36.0)
MCV: 88.8 fL (ref 78.0–100.0)
PLATELETS: 247 10*3/uL (ref 150–400)
RBC: 4.3 MIL/uL (ref 3.87–5.11)
RDW: 13.5 % (ref 11.5–15.5)
WBC: 6 10*3/uL (ref 4.0–10.5)

## 2016-02-17 LAB — LIPID PANEL
CHOL/HDL RATIO: 3 ratio
CHOLESTEROL: 160 mg/dL (ref 0–200)
HDL: 54 mg/dL (ref >40)
LDL Cholesterol: 88 mg/dL (ref 0–99)
TRIGLYCERIDES: 89 mg/dL (ref <150)
VLDL: 18 mg/dL (ref 0–40)

## 2016-02-17 LAB — COMPREHENSIVE METABOLIC PANEL
ALT: 305 U/L — ABNORMAL HIGH (ref 14–54)
ANION GAP: 9 (ref 5–15)
AST: 240 U/L — ABNORMAL HIGH (ref 15–41)
Albumin: 3.9 g/dL (ref 3.5–5.0)
Alkaline Phosphatase: 129 U/L — ABNORMAL HIGH (ref 38–126)
BUN: 14 mg/dL (ref 6–20)
CALCIUM: 9.1 mg/dL (ref 8.9–10.3)
CHLORIDE: 107 mmol/L (ref 101–111)
CO2: 24 mmol/L (ref 22–32)
Creatinine, Ser: 0.72 mg/dL (ref 0.44–1.00)
GFR calc non Af Amer: >60 mL/min (ref >60)
Glucose, Bld: 92 mg/dL (ref 65–99)
Potassium: 3.9 mmol/L (ref 3.5–5.1)
SODIUM: 140 mmol/L (ref 135–145)
Total Bilirubin: 1.6 mg/dL — ABNORMAL HIGH (ref 0.3–1.2)
Total Protein: 6.7 g/dL (ref 6.5–8.1)

## 2016-02-17 LAB — ECHOCARDIOGRAM COMPLETE
AVLVOTPG: 6 mmHg
E decel time: 151 msec
EERAT: 14.11
FS: 46 % — AB (ref 28–44)
IV/PV OW: 0.96
LA ID, A-P, ES: 40 mm
LA diam end sys: 40 mm
LA vol A4C: 48.8 ml
LA vol index: 27.1 mL/m2
LA vol: 49.8 mL
LADIAMINDEX: 2.17 cm/m2
LV PW d: 9.14 mm — AB (ref 0.6–1.1)
LV TDI E'MEDIAL: 6.53
LV e' LATERAL: 6.09 cm/s
LVEEAVG: 14.11
LVEEMED: 14.11
LVOT SV: 90 mL
LVOT VTI: 31.7 cm
LVOT area: 2.84 cm2
LVOT diameter: 19 mm
LVOTPV: 123 cm/s
MV Dec: 151
MV pk E vel: 85.9 m/s
MVPG: 3 mmHg
MVPKAVEL: 65 m/s
RV LATERAL S' VELOCITY: 10.6 cm/s
RV TAPSE: 25.6 mm
RV sys press: 40 mmHg
Reg peak vel: 284 cm/s
TDI e' lateral: 6.09
TR max vel: 284 cm/s

## 2016-02-17 LAB — URINE CULTURE: Colony Count: >=100000

## 2016-02-17 LAB — HEMOGLOBIN A1C
HEMOGLOBIN A1C: 5.5 % (ref 4.8–5.6)
MEAN PLASMA GLUCOSE: 111 mg/dL

## 2016-02-17 LAB — HEPATITIS PANEL, ACUTE
HEP A IGM: NEGATIVE
Hep B C IgM: NEGATIVE
Hepatitis B Surface Ag: NEGATIVE

## 2016-02-17 LAB — LIPASE, BLOOD: Lipase: 36 U/L (ref 11–51)

## 2016-02-17 MED ORDER — METRONIDAZOLE 500 MG PO TABS: 500.0000 mg | ORAL_TABLET | Freq: Three times a day (TID) | ORAL | 0 refills | Status: DC

## 2016-02-17 MED ORDER — CIPROFLOXACIN HCL 500 MG PO TABS: 500.0000 mg | ORAL_TABLET | Freq: Two times a day (BID) | ORAL | 0 refills | Status: DC

## 2016-02-17 MED ORDER — GADOBENATE DIMEGLUMINE 529 MG/ML IV SOLN
15.0000 mL | Freq: Once | INTRAVENOUS | Status: AC | PRN
  Administered 2016-02-17: 15 mL via INTRAVENOUS

## 2016-02-17 MED ORDER — FUROSEMIDE 40 MG PO TABS: 40.0000 mg | ORAL_TABLET | Freq: Every day | ORAL | 0 refills | Status: DC | PRN

## 2016-02-17 NOTE — Progress Notes (Addendum)
Patient was able to tolerate PO's well. N/V denied

## 2016-02-17 NOTE — Plan of Care (Signed)
Problem: Safety: Goal: Ability to remain free from injury will improve Outcome: Completed/Met Date Met: 02/17/16 Gait is steady. Patient is aware of safety prevention plan. Pt agreed to call if needing to get out of bed

## 2016-02-17 NOTE — Discharge Summary (Signed)
Discharge Summary  Kaylee HastenBrenda B Davis WUJ:[id number] DOB: Dec 05, 1940  PCP: Myrlene BrokerElizabeth A Crawford, MD  Admit date: 02/16/2016 Discharge date: 02/17/2016  Time spent: <8330mins  Recommendations for Outpatient Follow-up:  1. F/u with PMD within a week  for hospital discharge follow up, repeat cbc/bmp at follow up 2. F/u with [facility] to monitor liver function 3. F/u with cardiology for cardiac monitoring, she had brief asymptomatic tachycardia that is possibly SVT while she is having HIDA scan  Discharge Diagnoses:  Active Hospital Problems   Diagnosis Date Noted  . RUQ abdominal pain 02/16/2016  . Chest pain 02/16/2016  . Essential hypertension 02/26/2007    Resolved Hospital Problems   Diagnosis Date Noted Date Resolved  No resolved problems to display.    Discharge Condition: stable  Diet recommendation: heart healthy  There were no vitals filed for this visit.  History of present illness:  Chief Complaint: Chest pain.  HPI: Kaylee Davis is a 75 y.o. female with history of hypertension, peptic ulcer disease started experiencing chest pain last night around 11 PM radiating to her back. Pain lasted until patient reached ER and received pain relief medications. On exam patient had right upper quadrant tenderness. AST was mildly elevated. Bilirubin was normal. Cardiac markers and EKG were unremarkable. Since pain radiated to the back patient had CT angiogram of the chest abdomen and pelvis which was negative for dissection. There were some concerns for gallbladder issue. Sonogram of abdomen was done which shows dilated bile duct. Patient is being admitted for further management of chest pain and also right upper quadrant tenderness.   Patient was placed on Macrodantin yesterday by patient's PCP for UTI.  ED Course: CT angiogram of the chest abdomen and pelvis was negative for dissection. Sonogram of abdomen shows dilated common bile duct. Troponin negative.  Hospital  Course:  Active Problems:   Essential hypertension   RUQ abdominal pain   Chest pain   abdominal pain, pain has resolved, but with acute lft elevation Patient report patient started around 11pm radiates into her stomach, and to her back. CTA chest abdomen on admission no evidence of acute aortic dissection, does show "Mildly prominent extrahepatic common bile duct . No calcified stones are visualized. There is questionable gallbladder wall thickening versus contraction Abdominal us: 1. No sonographic evidence for cholelithiasis or acute Cholecystitis. 2. Borderline dilated common bile duct measuring slightly greater than 6 mm. Consider correlation with laboratory values to assess for Obstruction.  HIDA scan: Filling of gallbladder is noted. No definite evidence of cystic duct obstruction. MRCP unremarkable She was kept NPO and received IVF, [facility] consulted, now symptom has resolved, lft trending down, lipase wnl, ck wnl , hepatitis panel negative, no sure what is the underline etiology gi has cleared her to discharge home with close outpatient GI follow up.    Leukocytosis/lactic acidosis on admission no fever, she report she is started on macrobid yesterday to uti with dysuria, repeat ua unremarkable, she is start cipro/flagyl to cover both uti and possible gi source. Home meds lasix held, on gentle hydration with ns/k due to npo Leukocytosis resolved, but will discharge on flagyl and cipro for another three days due to initial leukocytosis and lactic acidosis and no clear diagnosis to explain the presenting symptom  Chest pain?  CTA chest no acute findings, No acute st/t changes on EKG,  troponin negative x3, echo lvef wnl, no wall motion abnormality, does has grade II diastolic dysfunction,  lipid panel unremarkable , a1c  5.5  tele ? One episode of ?SVT during hida scan,  Asymptomatic,  tsh unremarkable,  holter monitor outpatient to be arranged by cardiology office continue  ppi  HTN: continue home meds norvasc,  lasix held in the hospital, changed to daily prn for edema  Impaired fasting blood glucose: a1c 5.5,   Recurrent uti: repeat ua on admission unremarkable.  H/o bilateral knee replacement  Code Status: full  Family Communication: patient and husband at bedside  Disposition Plan: home with gi clearance on 11/30   Consultants:  [facility]I  Procedures:  CTA  HIDA  mrcp  Antibiotics:  Cipro/flagyl from 11/29    Discharge Exam: BP (!) 134/57 (BP Location: Left Arm)   Pulse 66   Temp 97.9 F (36.6 C) (Oral)   Resp 16   LMP 03/21/1983   SpO2 95%     General:  NAD  Cardiovascular: RRR, mild systolic murmur best heard at left upper sternal border  Respiratory: CTABL  Abdomen: Soft/ND/NT, positive BS  Musculoskeletal: No Edema  Neuro: aaox3   Discharge Instructions You were cared for by a hospitalist during your hospital stay. If you have any questions about your discharge medications or the care you received while you were in the hospital after you are discharged, you can call the unit and asked to speak with the hospitalist on call if the hospitalist that took care of you is not available. Once you are discharged, your primary care physician will handle any further medical issues. Please note that NO REFILLS for any discharge medications will be authorized once you are discharged, as it is imperative that you return to your primary care physician (or establish a relationship with a primary care physician if you do not have one) for your aftercare needs so that they can reassess your need for medications and monitor your lab values.  Discharge Instructions    Diet - low sodium heart healthy    Complete by:  As directed    Increase activity slowly    Complete by:  As directed        Medication List    STOP taking these medications   nitrofurantoin 100 MG capsule Commonly known as:  MACRODANTIN     TAKE these  medications   amLODipine 10 MG tablet Commonly known as:  NORVASC TAKE 1 BY MOUTH EVERY MORNING   ciprofloxacin 500 MG tablet Commonly known as:  CIPRO Take 1 tablet (500 mg total) by mouth 2 (two) times daily.   esomeprazole 20 MG capsule Commonly known as:  NEXIUM Take 1 capsule (20 mg total) by mouth every morning.   furosemide 40 MG tablet Commonly known as:  LASIX Take 1 tablet (40 mg total) by mouth daily as needed for fluid or edema. TAKE 1 BY MOUTH DAILY What changed:  how much to take  how to take this  when to take this  reasons to take this   metroNIDAZOLE 500 MG tablet Commonly known as:  FLAGYL Take 1 tablet (500 mg total) by mouth 3 (three) times daily.      Allergies  Allergen Reactions  . Other     PT STATES THE SULFA AND PENICILLIN SHE TOOK MANY YEARS AGO BOTH HAD RED DYES--SO SHE WONDERED IF ALLERGIC TO RED DYES  . Penicillins Hives    Has had penicillin since and has been fine.    . Sulfonamide Derivatives Hives   Follow-up Information    [facility] Office Follow up in 1  week(s).   Specialty:  Cardiology Why:  please contact their office for cardiac monitor if you do not hear from then in next three business days cardiac monitoring for sinus tachcycardia vs SVT Contact information: [address] [place] [address] [phone]       [facility] Follow up.   Specialty:  Gastroenterology Why:  office will call your to schedule appointment in two weeks to repeat liver function test Contact information: [address] [place] [address] [phone]       Myrlene Broker, MD Follow up in 1 week(s).   Specialty:  Internal Medicine Why:  hospital discharge follow up Contact information: [address] [place] [address] [phone]            The results of significant diagnostics from this hospitalization (including imaging,  microbiology, ancillary and laboratory) are listed below for reference.    Significant Diagnostic Studies: Dg Chest 2 View  Result Date: 02/16/2016 CLINICAL DATA:  Chest pain EXAM: CHEST  2 VIEW COMPARISON:  Chest radiograph 5242 FINDINGS: Shallow lung inflation. Cardiomediastinal contours are normal. No pneumothorax or pleural effusion.No focal airspace consolidation or pulmonary edema. IMPRESSION: No active cardiopulmonary disease. Electronically Signed   By: Deatra Robinson M.D.   On: 02/16/2016 01:24   Nm Hepatobiliary Liver Func  Result Date: 02/16/2016 CLINICAL DATA:  Right upper quadrant abdominal pain. EXAM: NUCLEAR MEDICINE HEPATOBILIARY IMAGING TECHNIQUE: Sequential images of the abdomen were obtained out to 60 minutes following intravenous administration of radiopharmaceutical. RADIOPHARMACEUTICALS:  5.0 mCi Tc-79m  Choletec IV COMPARISON:  Ultrasound of same day. FINDINGS: Prompt uptake and biliary excretion of activity by the liver is seen. Gallbladder activity is visualized, consistent with patency of cystic duct. Biliary activity passes into small bowel, consistent with patent common bile duct. IMPRESSION: Filling of gallbladder is noted. No definite evidence of cystic duct obstruction. Electronically Signed   By: Lupita Raider, M.D.   On: 02/16/2016 11:41   Mr 3d Recon At Scanner  Result Date: 02/17/2016 CLINICAL DATA:  eval for possible common bile duct dilatation. onset of severe chest pain. She went to bed feeling completely well but was awoken around 11pm yesterday with severe mid chest pain radiating through to her back. No associated shor.*comment was truncated*^41mL MULTIHANCE GADOBENATE DIMEGLUMINE 529 MG/ML IV SOLN EXAM: MRI ABDOMEN WITHOUT AND WITH CONTRAST (INCLUDING MRCP) TECHNIQUE: Multiplanar multisequence MR imaging of the abdomen was performed both before and after the administration of intravenous contrast. Heavily T2-weighted images of the biliary and pancreatic  ducts were obtained, and three-dimensional MRCP images were rendered by post processing. CONTRAST:  15mL MULTIHANCE GADOBENATE DIMEGLUMINE 529 MG/ML IV SOLN COMPARISON:  Ultrasound 02/16/2016 FINDINGS: Lower chest:  Lung bases are clear. Hepatobiliary: No focal hepatic lesion. No intrahepatic duct dilatation. The common hepatic duct and common bile duct are normal caliber. The common bile duct measures 4 mm. There is no filling defect within the common bile duct. No external compression. Gallbladder is normal. No gallstones Pancreas: Normal pancreatic parenchyma. No duct dilatation. Note variant ductal anatomy. Spleen: Normal spleen. Adrenals/urinary tract: Adrenal glands and kidneys are normal. Stomach/Bowel: Stomach and limited of the small bowel is unremarkable Vascular/Lymphatic: Abdominal aortic normal caliber. No retroperitoneal periportal lymphadenopathy. Musculoskeletal: No aggressive osseous lesion IMPRESSION: 1. Normal biliary tree.  No choledocholithiasis. 2. Normal gallbladder. 3. Normal pancreas Electronically Signed   By: Genevive Bi M.D.   On: 02/17/2016 12:46   Mr Abdomen Mrcp W Wo  Contast  Result Date: 02/17/2016 CLINICAL DATA:  eval for possible common bile duct dilatation. onset of severe chest pain. She went to bed feeling completely well but was awoken around 11pm yesterday with severe mid chest pain radiating through to her back. No associated shor.*comment was truncated*^3115mL MULTIHANCE GADOBENATE DIMEGLUMINE 529 MG/ML IV SOLN EXAM: MRI ABDOMEN WITHOUT AND WITH CONTRAST (INCLUDING MRCP) TECHNIQUE: Multiplanar multisequence MR imaging of the abdomen was performed both before and after the administration of intravenous contrast. Heavily T2-weighted images of the biliary and pancreatic ducts were obtained, and three-dimensional MRCP images were rendered by post processing. CONTRAST:  15mL MULTIHANCE GADOBENATE DIMEGLUMINE 529 MG/ML IV SOLN COMPARISON:  Ultrasound 02/16/2016 FINDINGS:  Lower chest:  Lung bases are clear. Hepatobiliary: No focal hepatic lesion. No intrahepatic duct dilatation. The common hepatic duct and common bile duct are normal caliber. The common bile duct measures 4 mm. There is no filling defect within the common bile duct. No external compression. Gallbladder is normal. No gallstones Pancreas: Normal pancreatic parenchyma. No duct dilatation. Note variant ductal anatomy. Spleen: Normal spleen. Adrenals/urinary tract: Adrenal glands and kidneys are normal. Stomach/Bowel: Stomach and limited of the small bowel is unremarkable Vascular/Lymphatic: Abdominal aortic normal caliber. No retroperitoneal periportal lymphadenopathy. Musculoskeletal: No aggressive osseous lesion IMPRESSION: 1. Normal biliary tree.  No choledocholithiasis. 2. Normal gallbladder. 3. Normal pancreas Electronically Signed   By: Genevive BiStewart  Edmunds M.D.   On: 02/17/2016 12:46   Ct Angio Chest/abd/pel For Dissection W And/or Wo Contrast  Result Date: 02/16/2016 CLINICAL DATA:  Chest pain going to the back EXAM: CT ANGIOGRAPHY CHEST, ABDOMEN AND PELVIS TECHNIQUE: Multidetector CT imaging through the chest, abdomen and pelvis was performed using the standard protocol during bolus administration of intravenous contrast. Multiplanar reconstructed images and MIPs were obtained and reviewed to evaluate the vascular anatomy. CONTRAST:  80 mL Isovue 370 intravenous COMPARISON:  None. FINDINGS: CTA CHEST FINDINGS Cardiovascular: Inadequate opacification of the pulmonary arterial system to assess for acute pulmonary embolus. There is no evidence for aortic dissection. Ascending aortic diameter is 3.2 cm. Mild atherosclerosis of the aortic arch. Normal 3 vessel origin from the aortic arch which is left-sided. Great vessels are widely patent. Heart size is within normal limits. No pericardial effusion. Mediastinum/Nodes: Multiple sub cm hypodense nodules in the thyroid gland. Trachea and mainstem bronchi appear  normal. No significantly enlarged mediastinal or hilar lymph nodes. The esophagus is within normal limits. Lungs/Pleura: Lungs are clear. No pleural effusion or pneumothorax. Musculoskeletal: No chest wall abnormality. No acute or significant osseous findings. Review of the MIP images confirms the above findings. CTA ABDOMEN AND PELVIS FINDINGS VASCULAR Aorta: Aorta is non aneurysmal. Scattered atherosclerotic calcification and mild mural thrombus. No dissection is seen. 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 Distal vessels are widely patent. SMA: Patent Renals: Single left renal artery which is widely patent. There are 2 right renal arteries, the superior artery is diminutive throughout its course and there is possible mild to moderate focal stenosis at its origin. The more inferior dominant artery demonstrates mild distal narrowing but is otherwise patent. IMA: Patent Inflow: No significant stenosis within the distal abdominal aorta or iliac vessels. External iliac arteries are patent. Veins: Limited evaluation due to arterial phase study. Review of the MIP images confirms the above findings. NON-VASCULAR Hepatobiliary: No focal hepatic abnormality. No calcified gallstones. Mildly prominent extrahepatic common bile duct measuring up to 8 mm. Questionable mild wall thickening versus contraction of the  gallbladder. Pancreas: Unremarkable. No pancreatic ductal dilatation or surrounding inflammatory changes. Spleen: Normal in size without focal abnormality. Adrenals/Urinary Tract: Adrenal glands are unremarkable. Kidneys are normal, without renal calculi, focal lesion, or hydronephrosis. Bladder is unremarkable. Stomach/Bowel: Stomach is mildly distended. No dilated small bowel. Post appendectomy changes. No colon wall thickening. Lymphatic: No significantly enlarged abdominal or pelvic lymph nodes. Reproductive: Status post hysterectomy. No adnexal masses.  Other: No free air or free fluid Musculoskeletal: No acute osseous abnormality. Degenerative changes most notable at L2-L3 were there is mild retrolisthesis of L2 on L3. Review of the MIP images confirms the above findings. IMPRESSION: 1. No CT evidence for acute aortic dissection. 2. Multiple sub cm hypodense nodules in the thyroid gland. 3. Mildly prominent extrahepatic common bile duct . No calcified stones are visualized. There is questionable gallbladder wall thickening versus contraction. If symptoms suggest gallbladder disease, correlation with ultrasound may be obtained. Electronically Signed   By: Jasmine Pang M.D.   On: 02/16/2016 01:50   US Abdomen Limited Ruq  Result Date: 02/16/2016 CLINICAL DATA:  Right upper quadrant pain EXAM: US ABDOMEN LIMITED - RIGHT UPPER QUADRANT COMPARISON:  CTA 02/16/2016 FINDINGS: Gallbladder: No gallstones or wall thickening visualized. No sonographic Murphy sign noted by sonographer. Common bile duct: Diameter: Borderline dilated at 6.6 mm maximum. Liver: No focal lesion identified. Within normal limits in parenchymal echogenicity. IMPRESSION: 1. No sonographic evidence for cholelithiasis or acute cholecystitis. 2. Borderline dilated common bile duct measuring slightly greater than 6 mm. Consider correlation with laboratory values to assess for obstruction. Electronically Signed   By: Jasmine Pang M.D.   On: 02/16/2016 03:08    Microbiology: Recent Results (from the past 240 hour(s))  Urine Culture     Status: None   Collection Time: 02/15/16  1:28 PM  Result Value Ref Range Status   Culture ESCHERICHIA COLI  Final   Colony Count >=100,000 COLONIES/ML  Final   Organism ID, Bacteria ESCHERICHIA COLI  Final      Susceptibility   Escherichia coli -  (no method available)    AMPICILLIN >=32 Resistant     AMOX/CLAVULANIC 4 Sensitive     AMPICILLIN/SULBACTAM 16 Intermediate     PIP/TAZO <=4 Sensitive     IMIPENEM <=0.25 Sensitive     CEFAZOLIN <=4 Not  Reportable     CEFTRIAXONE <=1 Sensitive     CEFTAZIDIME <=1 Sensitive     CEFEPIME <=1 Sensitive     GENTAMICIN <=1 Sensitive     TOBRAMYCIN <=1 Sensitive     CIPROFLOXACIN <=0.25 Sensitive     LEVOFLOXACIN <=0.12 Sensitive     NITROFURANTOIN <=16 Sensitive     TRIMETH/SULFA* <=20 Sensitive      * NR=NOT REPORTABLE,SEE COMMENTORAL therapy:A cefazolin MIC of <32 predicts susceptibility to the oral agents cefaclor,cefdinir,cefpodoxime,cefprozil,cefuroxime,cephalexin,and loracarbef when used for therapy of uncomplicated UTIs due to E.coli,K.pneumomiae,and P.mirabilis. PARENTERAL therapy: A cefazolinMIC of >8 indicates resistance to parenteralcefazolin. An alternate test method must beperformed to confirm susceptibility to parenteralcefazolin.  Urine culture     Status: Abnormal   Collection Time: 02/16/16  1:54 AM  Result Value Ref Range Status   Specimen Description URINE, CLEAN CATCH  Final   Special Requests NONE  Final   Culture MULTIPLE SPECIES PRESENT, SUGGEST RECOLLECTION (A)  Final   Report Status 02/17/2016 FINAL  Final     Labs: Basic Metabolic Panel:  Recent Labs Lab 02/16/16 0039 02/16/16 0051 02/16/16 0733 02/17/16 0524  NA 139 141 139 140  K  3.1* 3.1* 4.3 3.9  CL 105 102 105 107  CO2 24  --  25 24  GLUCOSE 177* 175* 111* 92  BUN 21* 22* 17 14  CREATININE 0.86 0.90 0.67 0.72  CALCIUM 9.4  --  9.2 9.1   Liver Function Tests:  Recent Labs Lab 02/16/16 0039 02/16/16 0733 02/17/16 0524  AST 100* 767* 240*  ALT 39 427* 305*  ALKPHOS 137* 145* 129*  BILITOT 0.6 1.5* 1.6*  PROT 7.0 6.9 6.7  ALBUMIN 4.3 4.6 3.9    Recent Labs Lab 02/16/16 0039 02/16/16 0733 02/17/16 0524  LIPASE 48 44 36   No results for input(s): AMMONIA in the last 168 hours. CBC:  Recent Labs Lab 02/16/16 0039 02/16/16 0051 02/16/16 0733 02/17/16 0524  WBC 13.7*  --  11.0* 6.0  NEUTROABS 10.8*  --  10.1*  --   HGB 13.3 13.9 13.1 12.5  HCT 40.1 41.0 39.5 38.2  MCV 86.6  --   87.2 88.8  PLT 294  --  280 247   Cardiac Enzymes:  Recent Labs Lab 02/16/16 0733 02/16/16 1342 02/16/16 1654 02/16/16 1933  CKTOTAL  --   --  25*  --   TROPONINI <0.03 <0.03  --  <0.03   BNP: BNP (last 3 results) No results for input(s): BNP in the last 8760 hours.  ProBNP (last 3 results) No results for input(s): PROBNP in the last 8760 hours.  CBG: No results for input(s): GLUCAP in the last 168 hours.     SignedAlbertine Grates MD, PhD  [facility] 02/17/2016, 7:16 PM

## 2016-02-17 NOTE — Progress Notes (Signed)
     [facility] Progress Note  Chief Complaint:   Abdominal pain , abnormal LFTs  Subjective: feels fine, no further abdominal pain. Hungry.   Objective:  Vital signs in last 24 hours: Temp:  [97.5 F (36.4 C)-98.9 F (37.2 C)] 97.5 F (36.4 C) (11/30 0556) Pulse Rate:  [59-71] 59 (11/30 0556) Resp:  [13-14] 13 (11/30 0556) BP: (113-133)/(49-72) 113/72 (11/30 0556) SpO2:  [96 %] 96 % (11/30 0556) Last BM Date: 02/15/16 General:   Alert, well-developed,  white female  in NAD EENT:  Normal hearing, non icteric sclera, conjunctive pink.  Heart:  Regular rate and rhythm; soft murmur present, no lower extremity edema Pulm: Normal respiratory effort, lungs CTA bilaterally without wheezes or crackles. Abdomen:  Soft, nondistended, nontender.  Normal bowel sounds, no masses felt. No hepatomegaly.    Neurologic:  Alert and  oriented x4;  grossly normal neurologically. Psych:  Alert and cooperative. Normal mood and affect. Skin:   Intake/Output from previous day: 11/29 0701 - 11/30 0700 In: 1300 [I.V.:900; IV Piggyback:400] Out: -  Intake/Output this shift: No intake/output data recorded.  Lab Results:  Recent Labs  02/16/16 0039 02/16/16 0051 02/16/16 0733 02/17/16 0524  WBC 13.7*  --  11.0* 6.0  HGB 13.3 13.9 13.1 12.5  HCT 40.1 41.0 39.5 38.2  PLT 294  --  280 247   BMET  Recent Labs  02/16/16 0039 02/16/16 0051 02/16/16 0733 02/17/16 0524  NA 139 141 139 140  K 3.1* 3.1* 4.3 3.9  CL 105 102 105 107  CO2 24  --  25 24  GLUCOSE 177* 175* 111* 92  BUN 21* 22* 17 14  CREATININE 0.86 0.90 0.67 0.72  CALCIUM 9.4  --  9.2 9.1   LFT  Recent Labs  02/16/16 0733 02/17/16 0524  PROT 6.9 6.7  ALBUMIN 4.6 3.9  AST 767* 240*  ALT 427* 305*  ALKPHOS 145* 129*  BILITOT 1.5* 1.6*  BILIDIR 0.5  --   IBILI 1.0*  --    PT/INR  Recent Labs  02/16/16 0039  LABPROT 11.8  INR 0.87   Hepatitis Panel  Recent Labs  02/16/16 1342  HEPBSAG  Negative  HCVAB <0.1  HEPAIGM Negative  HEPBIGM Negative     Assessment / Plan:  75 year old female with acute chest pain / markedly abnormal LFTs, Transaminases significantly improved overnight, Tbili, about the same at 1.6. MRCP is pending.  Hepatitis studies negative. CK normal.  -further recommendations to follow MRCP results    LOS: 0 days   Willette Clusteraula Guenther  02/17/2016, 9:14 AM Pager number [phone]  GI ATTENDING  Interval history data reviewed. Agree with interval progress note. Patient feeling better. Liver tests improving. MRCP is NORMAL. Not exactly sure why she had chest pain and subsequent elevated liver tests. Certainly possible that she passed an isolated stone, but generally would expect to see additional stones. Could be reactive. Serologies negative. Okay to go home with GI follow-up with Dr. Leone PayorGessner as arranged to trend liver tests as outpatient.  Wilhemina BonitoJohn N. Eda KeysPerry, Jr., M.D. [facility]

## 2016-02-17 NOTE — Progress Notes (Signed)
  Echocardiogram 2D Echocardiogram has been performed.  Kaylee SavoyCasey N Dorthey Davis 02/17/2016, 2:42 PM

## 2016-02-17 NOTE — Progress Notes (Signed)
Patient is at baseline. A&Ox4, ambulatory, Discharge instructions reviewed. Pt denied questions and or concerns.

## 2016-02-19 ENCOUNTER — Telehealth: Payer: Self-pay

## 2016-02-19 NOTE — Telephone Encounter (Signed)
Transition Care Management Follow-up Telephone Call   Date discharged? 02/17/2016  How have you been since you were released from the hospital? Pt is feeling much better.  Do you understand why you were in the hospital? Pt does understand.   Do you understand the discharge instruction? Pt does understand.   Where were you discharged to? Home  Items Reviewed:  Medications reviewed: Yes  Allergies reviewed: Yes  Dietary changes reviewed: Yes  Referrals reviewed: Yes  Functional Questionnaire:   Activities of Daily Living (ADLs):   She states they are independent in the following: Pt is independent with all ADL's.  States they require assistance with the following: N/A  Any transportation issues/concerns? No concerns  Any patient concerns? No other concerns.   Confirmed importance and date/time of follow-up visits scheduled Yes -   Provider Appointment booked with Okey Duprerawford on 02/25/16 10am.   Confirmed with patient if condition begins to worsen call PCP or go to the ER.  Patient was given the office number and encouraged to call back with question or concerns: Yes.

## 2016-02-22 ENCOUNTER — Other Ambulatory Visit: Payer: Self-pay | Admitting: Student

## 2016-02-22 DIAGNOSIS — I471 Supraventricular tachycardia: Secondary | ICD-10-CM

## 2016-02-24 ENCOUNTER — Ambulatory Visit (INDEPENDENT_AMBULATORY_CARE_PROVIDER_SITE_OTHER): Payer: Medicare Other

## 2016-02-24 DIAGNOSIS — I471 Supraventricular tachycardia: Secondary | ICD-10-CM

## 2016-02-25 ENCOUNTER — Encounter: Payer: Self-pay | Admitting: Internal Medicine

## 2016-02-25 ENCOUNTER — Other Ambulatory Visit (INDEPENDENT_AMBULATORY_CARE_PROVIDER_SITE_OTHER): Payer: Medicare Other

## 2016-02-25 ENCOUNTER — Ambulatory Visit (INDEPENDENT_AMBULATORY_CARE_PROVIDER_SITE_OTHER): Payer: Medicare Other | Admitting: Internal Medicine

## 2016-02-25 VITALS — BP 150/84 | HR 68 | Temp 97.8°F | Resp 16 | Ht 67.0 in | Wt 160.0 lb

## 2016-02-25 DIAGNOSIS — K8050 Calculus of bile duct without cholangitis or cholecystitis without obstruction: Secondary | ICD-10-CM | POA: Diagnosis not present

## 2016-02-25 DIAGNOSIS — R1011 Right upper quadrant pain: Secondary | ICD-10-CM

## 2016-02-25 DIAGNOSIS — R300 Dysuria: Secondary | ICD-10-CM | POA: Diagnosis not present

## 2016-02-25 DIAGNOSIS — R748 Abnormal levels of other serum enzymes: Secondary | ICD-10-CM

## 2016-02-25 DIAGNOSIS — I471 Supraventricular tachycardia: Secondary | ICD-10-CM | POA: Diagnosis not present

## 2016-02-25 LAB — CBC
HCT: 39.3 % (ref 36.0–46.0)
HEMOGLOBIN: 13.2 g/dL (ref 12.0–15.0)
MCHC: 33.5 g/dL (ref 30.0–36.0)
MCV: 85.8 fl (ref 78.0–100.0)
PLATELETS: 393 10*3/uL (ref 150.0–400.0)
RBC: 4.58 Mil/uL (ref 3.87–5.11)
RDW: 13.5 % (ref 11.5–15.5)
WBC: 8.7 10*3/uL (ref 4.0–10.5)

## 2016-02-25 LAB — URINALYSIS, ROUTINE W REFLEX MICROSCOPIC
BILIRUBIN URINE: NEGATIVE
HGB URINE DIPSTICK: NEGATIVE
Ketones, ur: NEGATIVE
Leukocytes, UA: NEGATIVE
NITRITE: NEGATIVE
Specific Gravity, Urine: 1.01 (ref 1.000–1.030)
TOTAL PROTEIN, URINE-UPE24: NEGATIVE
Urine Glucose: NEGATIVE
Urobilinogen, UA: 0.2 (ref 0.0–1.0)
pH: 5.5 (ref 5.0–8.0)

## 2016-02-25 LAB — VITAMIN D 25 HYDROXY (VIT D DEFICIENCY, FRACTURES): VITD: 18.29 ng/mL — AB (ref 30.00–100.00)

## 2016-02-25 LAB — COMPREHENSIVE METABOLIC PANEL
ALBUMIN: 4.3 g/dL (ref 3.5–5.2)
ALT: 48 U/L — ABNORMAL HIGH (ref 0–35)
AST: 30 U/L (ref 0–37)
Alkaline Phosphatase: 136 U/L — ABNORMAL HIGH (ref 39–117)
BUN: 16 mg/dL (ref 6–23)
CALCIUM: 9.4 mg/dL (ref 8.4–10.5)
CHLORIDE: 103 meq/L (ref 96–112)
CO2: 32 meq/L (ref 19–32)
CREATININE: 0.78 mg/dL (ref 0.40–1.20)
GFR: 75.97 mL/min (ref >60.00)
Glucose, Bld: 88 mg/dL (ref 70–99)
POTASSIUM: 4.5 meq/L (ref 3.5–5.1)
Sodium: 141 mEq/L (ref 135–145)
Total Bilirubin: 0.5 mg/dL (ref 0.2–1.2)
Total Protein: 7.3 g/dL (ref 6.0–8.3)

## 2016-02-25 LAB — VITAMIN B12: VITAMIN B 12: 227 pg/mL (ref 211–911)

## 2016-02-25 LAB — TSH: TSH: 2.96 u[IU]/mL (ref 0.35–4.50)

## 2016-02-25 NOTE — Patient Instructions (Signed)
We will check the blood and urine today and call you back with the results.

## 2016-02-25 NOTE — Assessment & Plan Note (Signed)
Not fully resolved so checking U/A today. Had treatment course while in the hospital.

## 2016-02-25 NOTE — Assessment & Plan Note (Signed)
Resolved at this time. She is still going to have follow up with GI. Acute hepatitis panel negative (did not include hep b immunity test). Checking CMP today.

## 2016-02-25 NOTE — Assessment & Plan Note (Signed)
Checking CMP and CBC, she will meet with surgeon as well and discuss options with them. At this time she does not want to pursue surgical removal.

## 2016-02-25 NOTE — Assessment & Plan Note (Signed)
Possible episode while in the hospital and is wearing holter. She admits to some episodes but more rare as she has gotten older. Diagnosed first in the 90s.

## 2016-02-25 NOTE — Progress Notes (Signed)
   Subjective:    Patient ID: Kaylee Davis, female    DOB: 06-19-1940, 75 y.o.   MRN: [id number]  HPI The patient is a 75 YO female coming in for hospital follow up (in for choledocholithiasis, with elevated liver enzymes, had US and CT and hida, pain resolved and no cardiac etiology). She is not having any recurrence of the pain. She is eating and drinking well. No diarrhea or constipation. She denies any symptoms and feels normal. She is worried that her urinary infection is not quite gone as she is having some burning. She is not sure if this is related to catheter in the hospital.   [facility], [facility], social history reviewed and updated.   Review of Systems  Constitutional: Negative for activity change, appetite change, fatigue, fever and unexpected weight change.  HENT: Negative.   Eyes: Negative.   Respiratory: Negative for cough, chest tightness and shortness of breath.   Cardiovascular: Negative for chest pain, palpitations and leg swelling.  Gastrointestinal: Negative for abdominal distention, abdominal pain, constipation, diarrhea, nausea and vomiting.  Musculoskeletal: Negative.   Skin: Negative.   Neurological: Negative.   Psychiatric/Behavioral: Negative.       Objective:   Physical Exam  Constitutional: She is oriented to person, place, and time. She appears well-developed and well-nourished.  HENT:  Head: Normocephalic and atraumatic.  Eyes: EOM are normal.  Neck: Normal range of motion.  Cardiovascular: Normal rate and regular rhythm.   Pulmonary/Chest: Effort normal and breath sounds normal. No respiratory distress. She has no wheezes. She has no rales.  Abdominal: Soft. Bowel sounds are normal. She exhibits no distension. There is no tenderness. There is no rebound.  Musculoskeletal: She exhibits no edema.  Neurological: She is alert and oriented to person, place, and time. Coordination normal.  Skin: Skin is warm and dry.   Vitals:   02/25/16 0958  BP: (!) 150/84    Pulse: 68  Resp: 16  Temp: 97.8 F (36.6 C)  TempSrc: Oral  SpO2: 96%  Weight: 160 lb (72.6 kg)  Height: 5\' 7"  (1.702 m)      Assessment & Plan:

## 2016-02-29 ENCOUNTER — Ambulatory Visit (INDEPENDENT_AMBULATORY_CARE_PROVIDER_SITE_OTHER): Payer: Medicare Other | Admitting: Nurse Practitioner

## 2016-02-29 ENCOUNTER — Encounter: Payer: Self-pay | Admitting: Nurse Practitioner

## 2016-02-29 VITALS — BP 118/60 | HR 85 | Ht 67.0 in | Wt 159.0 lb

## 2016-02-29 DIAGNOSIS — R7989 Other specified abnormal findings of blood chemistry: Secondary | ICD-10-CM | POA: Diagnosis not present

## 2016-02-29 DIAGNOSIS — R945 Abnormal results of liver function studies: Secondary | ICD-10-CM

## 2016-02-29 DIAGNOSIS — Z471 Aftercare following joint replacement surgery: Secondary | ICD-10-CM | POA: Diagnosis not present

## 2016-02-29 DIAGNOSIS — Z96651 Presence of right artificial knee joint: Secondary | ICD-10-CM | POA: Diagnosis not present

## 2016-02-29 DIAGNOSIS — M7552 Bursitis of left shoulder: Secondary | ICD-10-CM | POA: Diagnosis not present

## 2016-02-29 NOTE — Progress Notes (Signed)
HPI: Patient is a 75 year old known very remotely to Dr.Gessner. She was hospitalized for a couple of days late November with chest pain and elevated LFTs. Patient presented to the emergency department after severe chest pain awoke her from sleep. The chest pain radiated straight to the back. CT angiogram was unremarkable. Cardiac enzymes and EKG not concerning for acute coronary event.   In the emergency department patient's alkaline phosphatase was mildly elevated at 137, AST 100, ALT 39, bilirubin normal at 0.6.  Lactic acid elevated at 2.57. The following morning AST rose to 767, ALT 427, total bilirubin 1.5, indirect bilirubin 1.0. CK 25.  Patient had started Macrobid for UTI and the day prior to admission but had taken the antibiotic in the past without problems. She was given Cipro inpatient for UTI  Since leaving the hospital patient has not had any further epigastric or chest pain. She feels great. Eating well.    Past Medical History:  Diagnosis Date  . Arthritis    OA BOTH KNEES; OCCAS PAIN LEFT SHOULDER - HX OF ROTATOR CUFF PROBLEM  . Dyspepsia   . GERD (gastroesophageal reflux disease)   . GI bleed    GI BLEED FROM DUODENAL ULCER--ABOUT 17 YRS AGO - GIVEN TRANSFUSIONS  . History of blood transfusion    1990's  . History of frequent urinary tract infections   . History of PSVT (paroxysmal supraventricular tachycardia)    can control by doing a valsalva. Triggered by elevated heart rate  . Hypertension   . Imbalance   . Migraine headache    much reduced frequency  . Tachycardia    Episodic  . Tingling    left foot   . TMJ (temporomandibular joint disorder)    PAST HX GRINDING TEETH --BUT NO LONGER A PROBLEM  . Wears glasses     Patient's surgical history, family medical history, social history, medications and allergies were all reviewed in Epic    Physical Exam: BP 118/60   Pulse 85   Ht _0  (1.702 m)   Wt 159 lb (72.1 kg)   LMP 03/21/1983   BMI  24.90 kg/m   GENERAL: well developed white female in NAD PSYCH: :Pleasant, cooperative, normal affect HEENT: Normocephalic, conjunctiva pink, mucous membranes moist, neck supple without masses CARDIAC:  RRR, no murmur heard, no peripheral edema PULM: Normal respiratory effort, lungs CTA bilaterally, no wheezing ABDOMEN:  soft, nontender, nondistended, no obvious masses, no hepatomegaly,  normal bowel sounds SKIN:  turgor, no lesions seen Musculoskeletal:  Normal muscle tone, normal  strength NEURO: Alert and oriented x 3, no focal neurologic deficits  ASSESSMENT and PLAN:  75 year old female hospitalized a couple of weeks ago with acute chest pain / elevated LFTs.  Cardiac event ruled out. On admission transaminases mildly abnormal but rose significantly overnight with some mild elevated in alk phos and tbili as well.  U/S suggested borderline CBD dilation at 6.39m without CBD stones. Gallbladder appeared normal.  No cholelithiasis / choledocholithiasis/ or other pancreaticobiliary abnormalities MRCP. Etiology of abnormal LFTs was not determined. LFTs but numbers rapidly improved and she has no further episodes of pain .  -She could have passed a stone, just seems unusual given absence of cholelithiasis. No further episodes of chest / epigastric pain. Patient not interested in any further investigation at this time and that is certainly reasonable since she feels great and LFTs have nearly normalized.   Colon cancer screening. Never had a colonoscopy. No alarm symptoms.  Normal hgb. No [place] of [facility]. Not interested in screening.    Tye Savoy NP  02/29/2016, 11:36 AM

## 2016-02-29 NOTE — Patient Instructions (Signed)
Follow up as needed

## 2016-03-02 ENCOUNTER — Telehealth: Payer: Self-pay

## 2016-03-02 NOTE — Telephone Encounter (Signed)
Called patient and advised of lab results and dr crawfords instructions

## 2016-05-11 NOTE — Progress Notes (Signed)
76 y.o. G72P2002 Married Caucasian female here for annual exam.    Increased hot flashes.  Annoyance but can live with them. Normal TSH and glucose in December 2017.  Some urinary frequency.   No dysuria.  On diuretic.   Had right knee replacement.  Uses a cane when she is out of the house.  Already had the left knee replaced.  Happy to have this behind her.  Had elevated liver enzymes noted after she took Nitrofurantoin. Was hospitalized for evaluation.  Not certain if this was due to the abx or not.   PCP: Hillard Danker, MD  Patient's last menstrual period was 03/21/1983.           Sexually active: Yes.   female The current method of family planning is status post hysterectomy/BSO.    Exercising: Yes.    walking Smoker:  no  Health Maintenance: Pap: 2003 Neg  History of abnormal Pap:  no MMG: 05-11-14 Density A/Neg/BiRads1:[facility]. Colonoscopy:  NEVER.  Chose not to do this.  BMD:   2013  Result: Normal:[facility] TDaP:  12-11-08 Gardasil:   N/A Screening Labs:  Hb today: PCP, Urine today: not done   reports that she has never smoked. She has never used smokeless tobacco. She reports that she does not drink alcohol or use drugs.  Past Medical History:  Diagnosis Date  . Arthritis    OA BOTH KNEES; OCCAS PAIN LEFT SHOULDER - HX OF ROTATOR CUFF PROBLEM  . Dyspepsia   . GERD (gastroesophageal reflux disease)   . GI bleed    GI BLEED FROM DUODENAL ULCER--ABOUT 17 YRS AGO - GIVEN TRANSFUSIONS  . History of blood transfusion    1990's  . History of frequent urinary tract infections   . History of PSVT (paroxysmal supraventricular tachycardia)    can control by doing a valsalva. Triggered by elevated heart rate  . Hypertension   . Imbalance   . Migraine headache    much reduced frequency  . Tachycardia    Episodic  . Tingling    left foot   . TMJ (temporomandibular joint disorder)    PAST HX GRINDING TEETH --BUT NO LONGER A PROBLEM  . Wears glasses     Past  Surgical History:  Procedure Laterality Date  . ABDOMINAL HYSTERECTOMY  1985   TAH/BSO--for ?fibroids  . APPENDECTOMY    . ARTHROPLASTY     left Knee   . cyst removed      from mouth  . KNEE ARTHROSCOPY Left 10/2005  . LAPAROSCOPIC APPENDECTOMY  02/2000  . TONSILLECTOMY    . 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 TOTAL KNEE ARTHROPLASTY Right 05/28/2015   Procedure: RIGHT TOTAL KNEE ARTHROPLASTY;  Surgeon: Ollen Gross, MD;  Location: [facility];  Service: Orthopedics;  Laterality: Right;  . WISDOM TOOTH EXTRACTION  2008    Current Outpatient Prescriptions  Medication Sig Dispense Refill  . amLODipine (NORVASC) 10 MG tablet TAKE 1 BY MOUTH EVERY MORNING 90 tablet 3  . esomeprazole (NEXIUM) 20 MG capsule Take 1 capsule (20 mg total) by mouth every morning. 90 capsule 3  . furosemide (LASIX) 40 MG tablet Take 1 tablet (40 mg total) by mouth daily as needed for fluid or edema. TAKE 1 BY MOUTH DAILY 90 tablet 0  . meloxicam (MOBIC) 15 MG tablet Take 1 tablet by mouth daily.  3  No current facility-administered medications for this visit.     Family History  Problem Relation Age of Onset  . Cancer Neg Hx     Breast/Colon  . Diabetes Neg Hx   . Heart disease Neg Hx     CAD/MI    ROS:  Pertinent items are noted in HPI.  Otherwise, a comprehensive ROS was negative.  Exam:   BP 140/82 (BP Location: Right Arm, Patient Position: Sitting, Cuff Size: Normal)   Pulse 70   Resp 16   Ht 5\' 6"  (1.676 m)   Wt 162 lb 9.6 oz (73.8 kg)   LMP 03/21/1983   BMI 26.24 kg/m     General appearance: alert, cooperative and appears stated age Head: Normocephalic, without obvious abnormality, atraumatic Neck: no adenopathy, supple, symmetrical, trachea midline and thyroid normal to inspection and palpation Lungs: clear to auscultation bilaterally Breasts: normal  appearance, no masses or tenderness, No nipple retraction or dimpling, No nipple discharge or bleeding, No axillary or supraclavicular adenopathy Heart: regular rate and rhythm Abdomen: soft, non-tender; no masses, no organomegaly Extremities: extremities normal, atraumatic, no cyanosis or edema Skin: Skin color, texture, turgor normal. No rashes or lesions Lymph nodes: Cervical, supraclavicular, and axillary nodes normal. No abnormal inguinal nodes palpated Neurologic: Grossly normal  Pelvic: External genitalia:  no lesions              Urethra:  normal appearing urethra with no masses, tenderness or lesions              Bartholins and Skenes: normal                 Vagina: normal appearing vagina with normal color and discharge, no lesions              Cervix:  Absent.              Pap taken: No. Bimanual Exam:  Uterus:   Absent.               Adnexa: no mass, fullness, tenderness              Rectal exam: Yes.  .  Confirms.              Anus:  normal sphincter tone, no lesions  Chaperone was present for exam.  Assessment:   Well woman visit with normal exam. Status post TAH/BSO. Menopausal symptoms.   Plan: Mammogram screening discussed.  She will schedule her mammogram.  Recommended self breast awareness. Pap and HR HPV as above. Guidelines for Calcium, Vitamin D, regular exercise program including cardiovascular and weight bearing exercise. I discussed colonoscopy versus Cologard versus IFOB.  Patient will discuss further with her PCP. Discussion regarding herbal tx for hot flashes.  She declines an SSRI, Catapress, or Gabapentin.   Follow up annually and prn.       After visit summary provided.

## 2016-05-17 ENCOUNTER — Encounter: Payer: Self-pay | Admitting: Obstetrics and Gynecology

## 2016-05-17 ENCOUNTER — Ambulatory Visit (INDEPENDENT_AMBULATORY_CARE_PROVIDER_SITE_OTHER): Payer: Medicare Other | Admitting: Obstetrics and Gynecology

## 2016-05-17 VITALS — BP 140/82 | HR 70 | Resp 16 | Ht 66.0 in | Wt 162.6 lb

## 2016-05-17 DIAGNOSIS — Z01419 Encounter for gynecological examination (general) (routine) without abnormal findings: Secondary | ICD-10-CM

## 2016-05-17 DIAGNOSIS — N951 Menopausal and female climacteric states: Secondary | ICD-10-CM

## 2016-05-17 NOTE — Patient Instructions (Signed)
EXERCISE AND DIET:  We recommended that you start or continue a regular exercise program for good health. Regular exercise means any activity that makes your heart beat faster and makes you sweat.  We recommend exercising at least 30 minutes per day at least 3 days a week, preferably 4 or 5.  We also recommend a diet low in fat and sugar.  Inactivity, poor dietary choices and obesity can cause diabetes, heart attack, stroke, and kidney damage, among others.    ALCOHOL AND SMOKING:  Women should limit their alcohol intake to no more than 7 drinks/beers/glasses of wine (combined, not each!) per week. Moderation of alcohol intake to this level decreases your risk of breast cancer and liver damage. And of course, no recreational drugs are part of a healthy lifestyle.  And absolutely no smoking or even second hand smoke. Most people know smoking can cause heart and lung diseases, but did you know it also contributes to weakening of your bones? Aging of your skin?  Yellowing of your teeth and nails?  CALCIUM AND VITAMIN D:  Adequate intake of calcium and Vitamin D are recommended.  The recommendations for exact amounts of these supplements seem to change often, but generally speaking 600 mg of calcium (either carbonate or citrate) and 800 units of Vitamin D per day seems prudent. Certain women may benefit from higher intake of Vitamin D.  If you are among these women, your doctor will have told you during your visit.    PAP SMEARS:  Pap smears, to check for cervical cancer or precancers,  have traditionally been done yearly, although recent scientific advances have shown that most women can have pap smears less often.  However, every woman still should have a physical exam from her gynecologist every year. It will include a breast check, inspection of the vulva and vagina to check for abnormal growths or skin changes, a visual exam of the cervix, and then an exam to evaluate the size and shape of the uterus and  ovaries.  And after 78 years of age, a rectal exam is indicated to check for rectal cancers. We will also provide age appropriate advice regarding health maintenance, like when you should have certain vaccines, screening for sexually transmitted diseases, bone density testing, colonoscopy, mammograms, etc.   MAMMOGRAMS:  All women over 40 years old should have a yearly mammogram. Many facilities now offer a "3D" mammogram, which may cost around $50 extra out of pocket. If possible,  we recommend you accept the option to have the 3D mammogram performed.  It both reduces the number of women who will be called back for extra views which then turn out to be normal, and it is better than the routine mammogram at detecting truly abnormal areas.    COLONOSCOPY:  Colonoscopy to screen for colon cancer is recommended for all women at age 50.  We know, you hate the idea of the prep.  We agree, BUT, having colon cancer and not knowing it is worse!!  Colon cancer so often starts as a polyp that can be seen and removed at colonscopy, which can quite literally save your life!  And if your first colonoscopy is normal and you have no family history of colon cancer, most women don't have to have it again for 10 years.  Once every ten years, you can do something that may end up saving your life, right?  We will be happy to help you get it scheduled when you are ready.    Be sure to check your insurance coverage so you understand how much it will cost.  It may be covered as a preventative service at no cost, but you should check your particular policy.     Menopause and Herbal Products What is menopause? Menopause is the normal time of life when menstrual periods decrease in frequency and eventually stop completely. This process can take several years for some women. Menopause is complete when you have had an absence of menstruation for a full year since your last menstrual period. It usually occurs between the ages of 48 and  55. It is not common for menopause to begin before the age of 40. During menopause, your body stops producing the female hormones estrogen and progesterone. Common symptoms associated with this loss of hormones (vasomotor symptoms) are:  Hot flashes.  Hot flushes.  Night sweats.  Other common symptoms and complications of menopause include:  Decrease in sex drive.  Vaginal dryness and thinning of the walls of the vagina. This can make sex painful.  Dryness of the skin and development of wrinkles.  Headaches.  Tiredness.  Irritability.  Memory problems.  Weight gain.  Bladder infections.  Hair growth on the face and chest.  Inability to reproduce offspring (infertility).  Loss of density in the bones (osteoporosis) increasing your risk for breaks (fractures).  Depression.  Hardening and narrowing of the arteries (atherosclerosis). This increases your risk of heart attack and stroke.  What treatment options are available? There are many treatment choices for menopause symptoms. The most common treatment is hormone replacement therapy. Many alternative therapies for menopause are emerging, including the use of herbal products. These supplements can be found in the form of herbs, teas, oils, tinctures, and pills. Common herbal supplements for menopause are made from plants that contain phytoestrogens. Phytoestrogens are compounds that occur naturally in plants and plant products. They act like estrogen in the body. Foods and herbs that contain phytoestrogens include:  Soy.  Flax seeds.  Red clover.  Ginseng.  What menopause symptoms may be helped if I use herbal products?  Vasomotor symptoms. These may be helped by: ? Soy. Some studies show that soy may have a moderate benefit for hot flashes. ? Black cohosh. There is limited evidence indicating this may be beneficial for hot flashes.  Symptoms that are related to heart and blood vessel disease. These may be  helped by soy. Studies have shown that soy can help to lower cholesterol.  Depression. This may be helped by: ? St. John's wort. There is limited evidence that shows this may help mild to moderate depression. ? Black cohosh. There is evidence that this may help depression and mood swings.  Osteoporosis. Soy may help to decrease bone loss that is associated with menopause and may prevent osteoporosis. Limited evidence indicates that red clover may offer some bone loss protection as well. Other herbal products that are commonly used during menopause lack enough evidence to support their use as a replacement for conventional menopause therapies. These products include evening primrose, ginseng, and red clover. What are the cases when herbal products should not be used during menopause? Do not use herbal products during menopause without your health care provider's approval if:  You are taking medicine.  You have a preexisting liver condition.  Are there any risks in my taking herbal products during menopause? If you choose to use herbal products to help with symptoms of menopause, keep in mind that:  Different supplements have different and unmeasured   amounts of herbal ingredients.  Herbal products are not regulated the same way that medicines are.  Concentrations of herbs may vary depending on the way they are prepared. For example, the concentration may be different in a pill, tea, oil, and tincture.  Little is known about the risks of using herbal products, particularly the risks of long-term use.  Some herbal supplements can be harmful when combined with certain medicines.  Most commonly reported side effects of herbal products are mild. However, if used improperly, many herbal supplements can cause serious problems. Talk to your health care provider before starting any herbal product. If problems develop, stop taking the supplement and let your health care provider know. This  information is not intended to replace advice given to you by your health care provider. Make sure you discuss any questions you have with your health care provider. Document Released: 08/23/2007 Document Revised: 02/01/2016 Document Reviewed: 08/19/2013 Elsevier Interactive Patient Education  2017 [employer]  

## 2016-07-21 ENCOUNTER — Telehealth: Payer: Self-pay | Admitting: *Deleted

## 2016-07-21 MED ORDER — MELOXICAM 15 MG PO TABS: 15.0000 mg | ORAL_TABLET | Freq: Every day | ORAL | 1 refills | Status: DC

## 2016-07-21 MED ORDER — AMLODIPINE BESYLATE 10 MG PO TABS: ORAL_TABLET | ORAL | 1 refills | Status: DC

## 2016-07-21 NOTE — Telephone Encounter (Signed)
Sent in

## 2016-07-21 NOTE — Telephone Encounter (Signed)
Left msg on triage needing refills sent to [facility] for her meloxicam and amlodipine...Raechel Chute/lmb

## 2016-08-19 ENCOUNTER — Encounter (INDEPENDENT_AMBULATORY_CARE_PROVIDER_SITE_OTHER): Payer: Self-pay

## 2016-08-19 ENCOUNTER — Other Ambulatory Visit (HOSPITAL_COMMUNITY)
Admission: RE | Admit: 2016-08-19 | Discharge: 2016-08-19 | Disposition: A | Discharge status: Home / Self Care | Payer: Medicare Other | Source: Other Acute Inpatient Hospital | Attending: Family Medicine | Admitting: Family Medicine

## 2016-08-19 ENCOUNTER — Ambulatory Visit (INDEPENDENT_AMBULATORY_CARE_PROVIDER_SITE_OTHER): Payer: Medicare Other | Admitting: Family Medicine

## 2016-08-19 ENCOUNTER — Encounter: Payer: Self-pay | Admitting: Family Medicine

## 2016-08-19 VITALS — BP 112/72 | HR 72 | Temp 98.1°F | Ht 66.0 in | Wt 161.5 lb

## 2016-08-19 DIAGNOSIS — R300 Dysuria: Secondary | ICD-10-CM | POA: Diagnosis not present

## 2016-08-19 LAB — POC URINALSYSI DIPSTICK (AUTOMATED)
Bilirubin, UA: NEGATIVE
Blood, UA: POSITIVE
GLUCOSE UA: NEGATIVE
KETONES UA: NEGATIVE
Nitrite, UA: POSITIVE
PROTEIN UA: POSITIVE
SPEC GRAV UA: 1.025 (ref 1.010–1.025)
Urobilinogen, UA: 0.2 E.U./dL
pH, UA: 6 (ref 5.0–8.0)

## 2016-08-19 MED ORDER — CEPHALEXIN 500 MG PO CAPS
500.0000 mg | ORAL_CAPSULE | Freq: Three times a day (TID) | ORAL | 0 refills | Status: DC
Start: 2016-08-19 — End: 2017-01-08

## 2016-08-19 NOTE — Patient Instructions (Signed)
Take the antibiotic as instructed.  I hope you are feeling better soon! Seek care immediately if worsening, new concerns or you are not improving with treatment.

## 2016-08-19 NOTE — Progress Notes (Signed)
HPI:  Acute Saturday clinic visit for "UTI": -started a few days ago -symptoms include burning with urination, frequency, urgency -denies fevers, NVD, flank pain, hematuria, vag symptoms -reports allergic rx to macrobid last time she took it -reports thinks not allergic to penicillin  ROS: See pertinent positives and negatives per HPI.  Past Medical History:  Diagnosis Date  . Arthritis    OA BOTH KNEES; OCCAS PAIN LEFT SHOULDER - HX OF ROTATOR CUFF PROBLEM  . Dyspepsia   . GERD (gastroesophageal reflux disease)   . GI bleed    GI BLEED FROM DUODENAL ULCER--ABOUT 17 YRS AGO - GIVEN TRANSFUSIONS  . History of blood transfusion    1990's  . History of frequent urinary tract infections   . History of PSVT (paroxysmal supraventricular tachycardia)    can control by doing a valsalva. Triggered by elevated heart rate  . Hypertension   . Imbalance   . Migraine headache    much reduced frequency  . Tachycardia    Episodic  . Tingling    left foot   . TMJ (temporomandibular joint disorder)    PAST HX GRINDING TEETH --BUT NO LONGER A PROBLEM  . Wears glasses     Past Surgical History:  Procedure Laterality Date  . ABDOMINAL HYSTERECTOMY  1985   TAH/BSO--for ?fibroids  . APPENDECTOMY    . ARTHROPLASTY     left Knee   . cyst removed      from mouth  . KNEE ARTHROSCOPY Left 10/2005  . LAPAROSCOPIC APPENDECTOMY  02/2000  . TONSILLECTOMY    . 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 TOTAL KNEE ARTHROPLASTY Right 05/28/2015   Procedure: RIGHT TOTAL KNEE ARTHROPLASTY;  Surgeon: Ollen Gross, MD;  Location: [facility];  Service: Orthopedics;  Laterality: Right;  . WISDOM TOOTH EXTRACTION  2008    Family History  Problem Relation Age of Onset  . Cancer Neg Hx        Breast/Colon  . Diabetes Neg Hx   . Heart disease Neg Hx        CAD/MI     Social History   Social History  . Marital status: Married    Spouse name: N/A  . Number of children: 2  . Years of education: 12   Occupational History  .      retired   Social History Main Topics  . Smoking status: Never Smoker  . Smokeless tobacco: Never Used  . Alcohol use No     Comment: no   . Drug use: No  . Sexual activity: Not Currently    Partners: Male    Birth control/ protection: Surgical     Comment: TAH/BSO   Other Topics Concern  . 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 End of life issues - materials provided for contemplation. Still in the contemplative.   One grand son; 37      Current Outpatient Prescriptions:  .  amLODipine (NORVASC) 10 MG tablet, TAKE 1 BY MOUTH EVERY MORNING, Disp: 90 tablet, Rfl: 1 .  esomeprazole (NEXIUM) 20 MG capsule, Take 1 capsule (20 mg total) by mouth every morning., Disp: 90 capsule, Rfl: 3 .  furosemide (LASIX) 40 MG tablet, Take 1 tablet (40 mg total) by mouth daily as needed for fluid or  edema. TAKE 1 BY MOUTH DAILY, Disp: 90 tablet, Rfl: 0 .  meloxicam (MOBIC) 15 MG tablet, Take 1 tablet (15 mg total) by mouth daily., Disp: 90 tablet, Rfl: 1 .  cephALEXin (KEFLEX) 500 MG capsule, Take 1 capsule (500 mg total) by mouth 3 (three) times daily., Disp: 15 capsule, Rfl: 0  EXAM:  Vitals:   08/19/16 0926  BP: 112/72  Pulse: 72  Temp: 98.1 F (36.7 C)    Body mass index is 26.07 kg/m.  GENERAL: vitals reviewed and listed above, alert, oriented, appears well hydrated and in no acute distress  HEENT: atraumatic, conjunttiva clear, no obvious abnormalities on inspection of external nose and ears  NECK: no obvious masses on inspection  LUNGS: clear to auscultation bilaterally, no wheezes, rales or rhonchi, good air movement  CV: HRRR, no peripheral edema  ABD: BS+, soft, NTTP, no CVA TTP  MS: moves all extremities without noticeable  abnormality  PSYCH: pleasant and cooperative, no obvious depression or anxiety  ASSESSMENT AND PLAN:  Discussed the following assessment and plan:  Dysuria - Plan: POCT Urinalysis Dipstick (Automated)  -udip results +symptoms c/w likely UTI -discussed options and decided to treat with keflex, understands potential for cross rx with penicillin allergy - feels she does not have penicillin allergy -return precautions, culture pending -Patient advised to return or notify a doctor immediately if symptoms worsen or persist or new concerns arise.  Patient Instructions  Take the antibiotic as instructed.  I hope you are feeling better soon! Seek care immediately if worsening, new concerns or you are not improving with treatment.     Kaylee BasqueKIM, Kaylee Mataya R., DO

## 2016-08-21 LAB — URINE CULTURE: Culture: >=100000 — AB

## 2016-09-06 ENCOUNTER — Encounter: Payer: Self-pay | Admitting: Family Medicine

## 2016-09-06 ENCOUNTER — Ambulatory Visit (INDEPENDENT_AMBULATORY_CARE_PROVIDER_SITE_OTHER): Payer: Medicare Other | Admitting: Family Medicine

## 2016-09-06 VITALS — BP 138/75 | HR 62 | Temp 98.4°F | Ht 66.0 in | Wt 165.0 lb

## 2016-09-06 DIAGNOSIS — N390 Urinary tract infection, site not specified: Secondary | ICD-10-CM | POA: Diagnosis not present

## 2016-09-06 LAB — POC URINALSYSI DIPSTICK (AUTOMATED)
BILIRUBIN UA: NEGATIVE
GLUCOSE UA: NEGATIVE
KETONES UA: NEGATIVE
Nitrite, UA: NEGATIVE
Protein, UA: NEGATIVE
SPEC GRAV UA: 1.01 (ref 1.010–1.025)
UROBILINOGEN UA: 0.2 U/dL
pH, UA: 6 (ref 5.0–8.0)

## 2016-09-06 MED ORDER — CIPROFLOXACIN HCL 500 MG PO TABS: 500.0000 mg | ORAL_TABLET | Freq: Two times a day (BID) | ORAL | 0 refills | Status: DC

## 2016-09-06 NOTE — Patient Instructions (Signed)
WE NOW OFFER   [facility] FAST TRACK!!!  SAME DAY Appointments for ACUTE CARE  Such as: Sprains, Injuries, cuts, abrasions, rashes, muscle pain, joint pain, back pain Colds, flu, sore throats, headache, allergies, cough, fever  Ear pain, sinus and eye infections Abdominal pain, nausea, vomiting, diarrhea, upset stomach Animal/insect bites  3 Easy Ways to Schedule: Walk-In Scheduling Call in scheduling Mychart Sign-up: [email or web address]         

## 2016-09-06 NOTE — Progress Notes (Signed)
   Subjective:    Patient ID: Kaylee Davis, female    DOB: 1940/12/06, 76 y.o.   MRN: [id number]  HPI Here for 2 days of urinary urgency and burning. No fever. She was seen for an E coli UTI a month ago and was treated with Kelfex. She seemed to feel better after that. She drinks plenty of water.    Review of Systems  Constitutional: Negative.   Gastrointestinal: Negative.   Genitourinary: Positive for dysuria, frequency and urgency. Negative for flank pain.       Objective:   Physical Exam  Constitutional: She appears well-developed and well-nourished.  Cardiovascular: Normal rate, regular rhythm, normal heart sounds and intact distal pulses.   Pulmonary/Chest: Effort normal and breath sounds normal.  Abdominal: Soft. Bowel sounds are normal. She exhibits no distension and no mass. There is no tenderness. There is no rebound and no guarding.          Assessment & Plan:  UTI, treat with Cipro.  Gershon CraneStephen Leba Tibbitts, MD

## 2016-09-09 LAB — URINE CULTURE

## 2016-11-20 ENCOUNTER — Other Ambulatory Visit: Payer: Self-pay | Admitting: Internal Medicine

## 2016-11-24 ENCOUNTER — Other Ambulatory Visit: Payer: Self-pay | Admitting: Internal Medicine

## 2016-11-24 ENCOUNTER — Telehealth: Payer: Self-pay | Admitting: Internal Medicine

## 2016-11-24 NOTE — Telephone Encounter (Signed)
amLODipine (NORVASC) 10 MG tablet meloxicam (MOBIC) 15 MG tablet   Calling to follow up on a PA that was faxed over on 9/5.  [phone]  REF#: [id number]

## 2016-11-29 NOTE — Telephone Encounter (Signed)
Called the number, pharmacy stated they do not need a PA to get medication that it just needs to be approved for refill. Not due for refill till November.

## 2017-01-08 ENCOUNTER — Ambulatory Visit (INDEPENDENT_AMBULATORY_CARE_PROVIDER_SITE_OTHER): Payer: Medicare Other | Admitting: Internal Medicine

## 2017-01-08 ENCOUNTER — Other Ambulatory Visit (INDEPENDENT_AMBULATORY_CARE_PROVIDER_SITE_OTHER): Payer: Medicare Other

## 2017-01-08 ENCOUNTER — Encounter: Payer: Self-pay | Admitting: Internal Medicine

## 2017-01-08 VITALS — BP 132/78 | HR 67 | Temp 97.8°F | Ht 66.0 in | Wt 166.0 lb

## 2017-01-08 DIAGNOSIS — K219 Gastro-esophageal reflux disease without esophagitis: Secondary | ICD-10-CM

## 2017-01-08 DIAGNOSIS — Z23 Encounter for immunization: Secondary | ICD-10-CM

## 2017-01-08 DIAGNOSIS — Z0000 Encounter for general adult medical examination without abnormal findings: Secondary | ICD-10-CM | POA: Diagnosis not present

## 2017-01-08 DIAGNOSIS — I471 Supraventricular tachycardia: Secondary | ICD-10-CM

## 2017-01-08 DIAGNOSIS — I10 Essential (primary) hypertension: Secondary | ICD-10-CM | POA: Diagnosis not present

## 2017-01-08 DIAGNOSIS — M1710 Unilateral primary osteoarthritis, unspecified knee: Secondary | ICD-10-CM | POA: Diagnosis not present

## 2017-01-08 LAB — COMPREHENSIVE METABOLIC PANEL
ALBUMIN: 4.2 g/dL (ref 3.5–5.2)
ALT: 8 U/L (ref 0–35)
AST: 16 U/L (ref 0–37)
Alkaline Phosphatase: 101 U/L (ref 39–117)
BUN: 19 mg/dL (ref 6–23)
CHLORIDE: 102 meq/L (ref 96–112)
CO2: 30 mEq/L (ref 19–32)
Calcium: 9.3 mg/dL (ref 8.4–10.5)
Creatinine, Ser: 0.94 mg/dL (ref 0.40–1.20)
GFR: 61.12 mL/min (ref >60.00)
GLUCOSE: 111 mg/dL — AB (ref 70–99)
POTASSIUM: 3.9 meq/L (ref 3.5–5.1)
SODIUM: 140 meq/L (ref 135–145)
Total Bilirubin: 0.2 mg/dL (ref 0.2–1.2)
Total Protein: 6.9 g/dL (ref 6.0–8.3)

## 2017-01-08 LAB — CBC
HEMATOCRIT: 37.8 % (ref 36.0–46.0)
HEMOGLOBIN: 12.7 g/dL (ref 12.0–15.0)
MCHC: 33.5 g/dL (ref 30.0–36.0)
MCV: 87.3 fl (ref 78.0–100.0)
Platelets: 337 10*3/uL (ref 150.0–400.0)
RBC: 4.33 Mil/uL (ref 3.87–5.11)
RDW: 13.4 % (ref 11.5–15.5)
WBC: 9.6 10*3/uL (ref 4.0–10.5)

## 2017-01-08 LAB — LDL CHOLESTEROL, DIRECT: LDL DIRECT: 104 mg/dL

## 2017-01-08 LAB — LIPID PANEL
Cholesterol: 191 mg/dL (ref 0–200)
HDL: 49 mg/dL (ref >39.00)
NonHDL: 141.53
TRIGLYCERIDES: 287 mg/dL — AB (ref 0.0–149.0)
Total CHOL/HDL Ratio: 4
VLDL: 57.4 mg/dL — AB (ref 0.0–40.0)

## 2017-01-08 MED ORDER — FUROSEMIDE 40 MG PO TABS: 40.0000 mg | ORAL_TABLET | Freq: Every day | ORAL | 3 refills | Status: DC

## 2017-01-08 MED ORDER — MELOXICAM 15 MG PO TABS: 15.0000 mg | ORAL_TABLET | Freq: Every day | ORAL | 3 refills | Status: DC

## 2017-01-08 MED ORDER — AMLODIPINE BESYLATE 10 MG PO TABS: ORAL_TABLET | ORAL | 3 refills | Status: DC

## 2017-01-08 NOTE — Progress Notes (Signed)
   Subjective:    Patient ID: Kaylee Davis, female    DOB: March 11, 1941, 76 y.o.   MRN: [id number]  HPI Here for medicare wellness and physical, no new complaints. Please see A/P for status and treatment of chronic medical problems.   Diet: heart healthy Physical activity: sedentary Depression/mood screen: negative Hearing: intact to whispered voice, mild loss right Visual acuity: grossly normal with lens, performs annual eye exam  ADLs: capable Fall risk: low Home safety: good Cognitive evaluation: intact to orientation, naming, recall and repetition EOL planning: adv directives discussed  I have personally reviewed and have noted 1. The patient's medical and social history - reviewed today no changes 2. Their use of alcohol, tobacco or illicit drugs 3. Their current medications and supplements 4. The patient's functional ability including ADL's, fall risks, home safety risks and hearing or visual impairment. 5. Diet and physical activities 6. Evidence for depression or mood disorders 7. Care team reviewed and updated (available in snapshot)  Review of Systems  Constitutional: Negative.   HENT: Negative.   Eyes: Negative.   Respiratory: Negative for cough, chest tightness and shortness of breath.   Cardiovascular: Negative for chest pain, palpitations and leg swelling.  Gastrointestinal: Negative for abdominal distention, abdominal pain, constipation, diarrhea, nausea and vomiting.  Musculoskeletal: Negative.   Skin: Negative.   Neurological: Negative.   Psychiatric/Behavioral: Negative.       Objective:   Physical Exam  Constitutional: She is oriented to person, place, and time. She appears well-developed and well-nourished.  HENT:  Head: Normocephalic and atraumatic.  Eyes: EOM are normal.  Neck: Normal range of motion.  Cardiovascular: Normal rate and regular rhythm.   Pulmonary/Chest: Effort normal and breath sounds normal. No respiratory distress. She has no  wheezes. She has no rales.  Abdominal: Soft. Bowel sounds are normal. She exhibits no distension. There is no tenderness. There is no rebound.  Musculoskeletal: She exhibits no edema.  Neurological: She is alert and oriented to person, place, and time. Coordination normal.  Skin: Skin is warm and dry.  Psychiatric: She has a normal mood and affect.   Vitals:   01/08/17 1505  BP: 132/78  Pulse: 67  Temp: 97.8 F (36.6 C)  TempSrc: Oral  SpO2: 98%  Weight: 166 lb (75.3 kg)  Height: 5\' 6"  (1.676 m)      Assessment & Plan:  Flu shot given at visit

## 2017-01-08 NOTE — Patient Instructions (Signed)
We have sent in the refills and will check the labs today.  Health Maintenance, Female Adopting a healthy lifestyle and getting preventive care can go a long way to promote health and wellness. Talk with your health care provider about what schedule of regular examinations is right for you. This is a good chance for you to check in with your provider about disease prevention and staying healthy. In between checkups, there are plenty of things you can do on your own. Experts have done a lot of research about which lifestyle changes and preventive measures are most likely to keep you healthy. Ask your health care provider for more information. Weight and diet Eat a healthy diet  Be sure to include plenty of vegetables, fruits, low-fat dairy products, and lean protein.  Do not eat a lot of foods high in solid fats, added sugars, or salt.  Get regular exercise. This is one of the most important things you can do for your health. ? Most adults should exercise for at least 150 minutes each week. The exercise should increase your heart rate and make you sweat (moderate-intensity exercise). ? Most adults should also do strengthening exercises at least twice a week. This is in addition to the moderate-intensity exercise.  Maintain a healthy weight  Body mass index (BMI) is a measurement that can be used to identify possible weight problems. It estimates body fat based on height and weight. Your health care provider can help determine your BMI and help you achieve or maintain a healthy weight.  For females 60 years of age and older: ? A BMI below 18.5 is considered underweight. ? A BMI of 18.5 to 24.9 is normal. ? A BMI of 25 to 29.9 is considered overweight. ? A BMI of 30 and above is considered obese.  Watch levels of cholesterol and blood lipids  You should start having your blood tested for lipids and cholesterol at 79 years of age, then have this test every 5 years.  You may need to have  your cholesterol levels checked more often if: ? Your lipid or cholesterol levels are high. ? You are older than 78 years of age. ? You are at high risk for heart disease.  Cancer screening Lung Cancer  Lung cancer screening is recommended for adults 63-35 years old who are at high risk for lung cancer because of a history of smoking.  A yearly low-dose CT scan of the lungs is recommended for people who: ? Currently smoke. ? Have quit within the past 15 years. ? Have at least a 30-pack-year history of smoking. A pack year is smoking an average of one pack of cigarettes a day for 1 year.  Yearly screening should continue until it has been 15 years since you quit.  Yearly screening should stop if you develop a health problem that would prevent you from having lung cancer treatment.  Breast Cancer  Practice breast self-awareness. This means understanding how your breasts normally appear and feel.  It also means doing regular breast self-exams. Let your health care provider know about any changes, no matter how small.  If you are in your 20s or 30s, you should have a clinical breast exam (CBE) by a health care provider every 1-3 years as part of a regular health exam.  If you are 19 or older, have a CBE every year. Also consider having a breast X-ray (mammogram) every year.  If you have a family history of breast cancer, talk to your  health care provider about genetic screening.  If you are at high risk for breast cancer, talk to your health care provider about having an MRI and a mammogram every year.  Breast cancer gene (BRCA) assessment is recommended for women who have family members with BRCA-related cancers. BRCA-related cancers include: ? Breast. ? Ovarian. ? Tubal. ? Peritoneal cancers.  Results of the assessment will determine the need for genetic counseling and BRCA1 and BRCA2 testing.  Cervical Cancer Your health care provider may recommend that you be screened  regularly for cancer of the pelvic organs (ovaries, uterus, and vagina). This screening involves a pelvic examination, including checking for microscopic changes to the surface of your cervix (Pap test). You may be encouraged to have this screening done every 3 years, beginning at age 70.  For women ages 66-65, health care providers may recommend pelvic exams and Pap testing every 3 years, or they may recommend the Pap and pelvic exam, combined with testing for human papilloma virus (HPV), every 5 years. Some types of HPV increase your risk of cervical cancer. Testing for HPV may also be done on women of any age with unclear Pap test results.  Other health care providers may not recommend any screening for nonpregnant women who are considered low risk for pelvic cancer and who do not have symptoms. Ask your health care provider if a screening pelvic exam is right for you.  If you have had past treatment for cervical cancer or a condition that could lead to cancer, you need Pap tests and screening for cancer for at least 20 years after your treatment. If Pap tests have been discontinued, your risk factors (such as having a new sexual partner) need to be reassessed to determine if screening should resume. Some women have medical problems that increase the chance of getting cervical cancer. In these cases, your health care provider may recommend more frequent screening and Pap tests.  Colorectal Cancer  This type of cancer can be detected and often prevented.  Routine colorectal cancer screening usually begins at 78 years of age and continues through 78 years of age.  Your health care provider may recommend screening at an earlier age if you have risk factors for colon cancer.  Your health care provider may also recommend using home test kits to check for hidden blood in the stool.  A small camera at the end of a tube can be used to examine your colon directly (sigmoidoscopy or colonoscopy). This is  done to check for the earliest forms of colorectal cancer.  Routine screening usually begins at age 4.  Direct examination of the colon should be repeated every 5-10 years through 78 years of age. However, you may need to be screened more often if early forms of precancerous polyps or small growths are found.  Skin Cancer  Check your skin from head to toe regularly.  Tell your health care provider about any new moles or changes in moles, especially if there is a change in a mole's shape or color.  Also tell your health care provider if you have a mole that is larger than the size of a pencil eraser.  Always use sunscreen. Apply sunscreen liberally and repeatedly throughout the day.  Protect yourself by wearing long sleeves, pants, a wide-brimmed hat, and sunglasses whenever you are outside.  Heart disease, diabetes, and high blood pressure  High blood pressure causes heart disease and increases the risk of stroke. High blood pressure is more likely to  develop in: ? People who have blood pressure in the high end of the normal range (130-139/85-89 mm Hg). ? People who are overweight or obese. ? People who are African American.  If you are 93-29 years of age, have your blood pressure checked every 3-5 years. If you are 29 years of age or older, have your blood pressure checked every year. You should have your blood pressure measured twice-once when you are at a hospital or clinic, and once when you are not at a hospital or clinic. Record the average of the two measurements. To check your blood pressure when you are not at a hospital or clinic, you can use: ? An automated blood pressure machine at a pharmacy. ? A home blood pressure monitor.  If you are between 75 years and 4 years old, ask your health care provider if you should take aspirin to prevent strokes.  Have regular diabetes screenings. This involves taking a blood sample to check your fasting blood sugar level. ? If you are  at a normal weight and have a low risk for diabetes, have this test once every three years after 78 years of age. ? If you are overweight and have a high risk for diabetes, consider being tested at a younger age or more often. Preventing infection Hepatitis B  If you have a higher risk for hepatitis B, you should be screened for this virus. You are considered at high risk for hepatitis B if: ? You were born in a country where hepatitis B is common. Ask your health care provider which countries are considered high risk. ? Your parents were born in a high-risk country, and you have not been immunized against hepatitis B (hepatitis B vaccine). ? You have HIV or AIDS. ? You use needles to inject street drugs. ? You live with someone who has hepatitis B. ? You have had sex with someone who has hepatitis B. ? You get hemodialysis treatment. ? You take certain medicines for conditions, including cancer, organ transplantation, and autoimmune conditions.  Hepatitis C  Blood testing is recommended for: ? Everyone born from [birth year] through 1965. ? Anyone with known risk factors for hepatitis C.  Sexually transmitted infections (STIs)  You should be screened for sexually transmitted infections (STIs) including gonorrhea and chlamydia if: ? You are sexually active and are younger than 78 years of age. ? You are older than 78 years of age and your health care provider tells you that you are at risk for this type of infection. ? Your sexual activity has changed since you were last screened and you are at an increased risk for chlamydia or gonorrhea. Ask your health care provider if you are at risk.  If you do not have HIV, but are at risk, it may be recommended that you take a prescription medicine daily to prevent HIV infection. This is called pre-exposure prophylaxis (PrEP). You are considered at risk if: ? You are sexually active and do not regularly use condoms or know the HIV status of your  partner(s). ? You take drugs by injection. ? You are sexually active with a partner who has HIV.  Talk with your health care provider about whether you are at high risk of being infected with HIV. If you choose to begin PrEP, you should first be tested for HIV. You should then be tested every 3 months for as long as you are taking PrEP. Pregnancy  If you are premenopausal and you may become pregnant,  ask your health care provider about preconception counseling.  If you may become pregnant, take 400 to 800 micrograms (mcg) of folic acid every day.  If you want to prevent pregnancy, talk to your health care provider about birth control (contraception). Osteoporosis and menopause  Osteoporosis is a disease in which the bones lose minerals and strength with aging. This can result in serious bone fractures. Your risk for osteoporosis can be identified using a bone density scan.  If you are 42 years of age or older, or if you are at risk for osteoporosis and fractures, ask your health care provider if you should be screened.  Ask your health care provider whether you should take a calcium or vitamin D supplement to lower your risk for osteoporosis.  Menopause may have certain physical symptoms and risks.  Hormone replacement therapy may reduce some of these symptoms and risks. Talk to your health care provider about whether hormone replacement therapy is right for you. Follow these instructions at home:  Schedule regular health, dental, and eye exams.  Stay current with your immunizations.  Do not use any tobacco products including cigarettes, chewing tobacco, or electronic cigarettes.  If you are pregnant, do not drink alcohol.  If you are breastfeeding, limit how much and how often you drink alcohol.  Limit alcohol intake to no more than 1 drink per day for nonpregnant women. One drink equals 12 ounces of beer, 5 ounces of wine, or 1 ounces of hard liquor.  Do not use street  drugs.  Do not share needles.  Ask your health care provider for help if you need support or information about quitting drugs.  Tell your health care provider if you often feel depressed.  Tell your health care provider if you have ever been abused or do not feel safe at home. This information is not intended to replace advice given to you by your health care provider. Make sure you discuss any questions you have with your health care provider. Document Released: 09/19/2010 Document Revised: 08/12/2015 Document Reviewed: 12/08/2014 Elsevier Interactive Patient Education  [employer].

## 2017-01-09 NOTE — Assessment & Plan Note (Signed)
Taking her lasix and amlodipine and BP at goal. Checking CMP and adjust as needed.

## 2017-01-09 NOTE — Assessment & Plan Note (Signed)
Declines colonoscopy and has never had, flu shot given at visit. Pneumonia series completed. Tetanus due in 2020. Declines mammogram screening anymore. Counseled about sun screening and mole surveillance. Given 10 year screening recommendations.

## 2017-01-09 NOTE — Assessment & Plan Note (Signed)
Mobic refilled for OA given more benefit than risk.

## 2017-01-09 NOTE — Assessment & Plan Note (Signed)
Taking nexium otc every other day.

## 2017-01-09 NOTE — Assessment & Plan Note (Signed)
Some episodes with some stress but she declines to go back to cardiology.

## 2017-03-07 ENCOUNTER — Encounter: Payer: Self-pay | Admitting: Adult Health

## 2017-03-07 ENCOUNTER — Ambulatory Visit: Payer: Medicare Other | Admitting: Adult Health

## 2017-03-07 VITALS — BP 160/80 | Temp 97.6°F | Wt 166.0 lb

## 2017-03-07 DIAGNOSIS — N3001 Acute cystitis with hematuria: Secondary | ICD-10-CM | POA: Diagnosis not present

## 2017-03-07 LAB — POC URINALSYSI DIPSTICK (AUTOMATED)
Bilirubin, UA: NEGATIVE
GLUCOSE UA: NEGATIVE
Ketones, UA: NEGATIVE
Nitrite, UA: NEGATIVE
Protein, UA: NEGATIVE
Spec Grav, UA: 1.015 (ref 1.010–1.025)
UROBILINOGEN UA: 0.2 U/dL
pH, UA: 6 (ref 5.0–8.0)

## 2017-03-07 MED ORDER — CIPROFLOXACIN HCL 500 MG PO TABS: 500.0000 mg | ORAL_TABLET | Freq: Two times a day (BID) | ORAL | 0 refills | Status: DC

## 2017-03-07 NOTE — Progress Notes (Signed)
ZOX:[id number]:Crawford, Austin MilesElizabeth A, MD Chief Complaint  Patient presents with  . Urinary Urgency    Ongoing X1wk    Current Issues:  Presents with 7  days of urinary urgency and urinary frequency Associated symptoms include:  cloudy urine, flank pain bilaterally, lower abdominal pain, urinary frequency, urinary hesitancy, urinary incontinence and urinary urgency   Prior to Admission medications   Medication Sig Start Date End Date Taking? Authorizing Provider  amLODipine (NORVASC) 10 MG tablet TAKE 1 BY MOUTH EVERY MORNING 01/08/17   Myrlene Brokerrawford, Elizabeth A, MD  esomeprazole (NEXIUM) 20 MG capsule Take 1 capsule (20 mg total) by mouth every morning. 05/20/15   Myrlene Brokerrawford, Elizabeth A, MD  furosemide (LASIX) 40 MG tablet Take 1 tablet (40 mg total) by mouth daily. 01/08/17   Myrlene Brokerrawford, Elizabeth A, MD  meloxicam (MOBIC) 15 MG tablet Take 1 tablet (15 mg total) by mouth daily. 01/08/17   Myrlene Brokerrawford, Elizabeth A, MD    Review of Systems:See HPI   PE:  BP (!) 160/80 (BP Location: Left Arm)   Temp 97.6 F (36.4 C) (Oral)   Wt 166 lb (75.3 kg)   LMP 03/21/1983   BMI 26.79 kg/m  Constitutional: Alert and oriented. Does not appear in any acute distress.  Heart: Normal rate, normal rhythm. No m/r/g   Lungs: CTA Back: No CVA tenderness Abdomen: Tenderness to generalized abdomen  Results for orders placed or performed in visit on 03/07/17  POCT Urinalysis Dipstick (Automated)  Result Value Ref Range   Color, UA yellow    Clarity, UA cloudy    Glucose, UA N    Bilirubin, UA N    Ketones, UA N    Spec Grav, UA 1.015 1.010 - 1.025   Blood, UA 1+    pH, UA 6.0 5.0 - 8.0   Protein, UA N    Urobilinogen, UA 0.2 0.2 or 1.0 E.U./dL   Nitrite, UA N    Leukocytes, UA Large (3+) (A) Negative    Assessment and Plan:    1. Acute cystitis with hematuria  - POCT Urinalysis Dipstick (Automated) + leuks and blood - ciprofloxacin (CIPRO) 500 MG tablet; Take 1 tablet (500 mg total) by mouth 2 (two) times  daily.  Dispense: 6 tablet; Refill: 0 - Urine culture - Stay well hydrated  - Return precautions given   Shirline Freesory Abigail Marsiglia, NP

## 2017-03-10 LAB — URINE CULTURE
MICRO NUMBER:: 81428210
SPECIMEN QUALITY: ADEQUATE

## 2017-05-25 ENCOUNTER — Other Ambulatory Visit: Payer: Self-pay

## 2017-05-25 ENCOUNTER — Ambulatory Visit (INDEPENDENT_AMBULATORY_CARE_PROVIDER_SITE_OTHER): Payer: Medicare Other | Admitting: Obstetrics and Gynecology

## 2017-05-25 ENCOUNTER — Encounter: Payer: Self-pay | Admitting: Obstetrics and Gynecology

## 2017-05-25 ENCOUNTER — Other Ambulatory Visit: Payer: Self-pay | Admitting: Obstetrics and Gynecology

## 2017-05-25 VITALS — BP 146/70 | HR 76 | Resp 16 | Ht 65.0 in | Wt 165.0 lb

## 2017-05-25 DIAGNOSIS — Z124 Encounter for screening for malignant neoplasm of cervix: Secondary | ICD-10-CM | POA: Diagnosis not present

## 2017-05-25 DIAGNOSIS — Z01419 Encounter for gynecological examination (general) (routine) without abnormal findings: Secondary | ICD-10-CM

## 2017-05-25 DIAGNOSIS — Z139 Encounter for screening, unspecified: Secondary | ICD-10-CM

## 2017-05-25 NOTE — Progress Notes (Signed)
77 y.o. [id number] Married Caucasian female here for annual exam.    Hot flashes.  Urinary frequency and night time urination.  Taking a diuretic every other night.  States frequent UTIs.  (Looks like 2 in the last year on chart review.) Able to tell there is infection when she has burning.  Not burning today.  E Coli UTI in December 2018.   PCP:   Dr. Hillard DankerElizabeth Crawford   Patient's last menstrual period was 03/21/1983.           Sexually active: No.  The current method of family planning is status post hysterectomy.    Exercising: Yes.    walking and weights Smoker:  no  Health Maintenance: Pap:  2003 Negative History of abnormal Pap:  no MMG:  05/11/14 BIRADS 1 negative/density a Colonoscopy:  never BMD:   2013  Result  Normal TDaP:  12/11/08 Gardasil:   n/a HIV: never Hep C: never Screening Labs:  PCP   reports that  has never smoked. she has never used smokeless tobacco. She reports that she does not drink alcohol or use drugs.  Past Medical History:  Diagnosis Date  . Arthritis    OA BOTH KNEES; OCCAS PAIN LEFT SHOULDER - HX OF ROTATOR CUFF PROBLEM  . Dyspepsia   . GERD (gastroesophageal reflux disease)   . GI bleed    GI BLEED FROM DUODENAL ULCER--ABOUT 17 YRS AGO - GIVEN TRANSFUSIONS  . History of blood transfusion    1990's  . History of frequent urinary tract infections   . History of PSVT (paroxysmal supraventricular tachycardia)    can control by doing a valsalva. Triggered by elevated heart rate  . Hypertension   . Imbalance   . Migraine headache    much reduced frequency  . Tachycardia    Episodic  . Tingling    left foot   . TMJ (temporomandibular joint disorder)    PAST HX GRINDING TEETH --BUT NO LONGER A PROBLEM  . Wears glasses     Past Surgical History:  Procedure Laterality Date  . ABDOMINAL HYSTERECTOMY  1985   TAH/BSO--for ?fibroids  . APPENDECTOMY    . ARTHROPLASTY     left Knee   . cyst removed      from mouth  . KNEE ARTHROSCOPY  Left 10/2005  . LAPAROSCOPIC APPENDECTOMY  02/2000  . REPLACEMENT TOTAL KNEE Right   . TONSILLECTOMY    . 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 TOTAL KNEE ARTHROPLASTY Right 05/28/2015   Procedure: RIGHT TOTAL KNEE ARTHROPLASTY;  Surgeon: Ollen GrossFrank Aluisio, MD;  Location: [facility];  Service: Orthopedics;  Laterality: Right;  . WISDOM TOOTH EXTRACTION  2008    Current Outpatient Medications  Medication Sig Dispense Refill  . amLODipine (NORVASC) 10 MG tablet TAKE 1 BY MOUTH EVERY MORNING 90 tablet 3  . esomeprazole (NEXIUM) 20 MG capsule Take 1 capsule (20 mg total) by mouth every morning. 90 capsule 3  . furosemide (LASIX) 40 MG tablet Take 1 tablet (40 mg total) by mouth daily. 90 tablet 3  . meloxicam (MOBIC) 15 MG tablet Take 1 tablet (15 mg total) by mouth daily. 90 tablet 3   No current facility-administered medications for this visit.     Family History  Problem Relation Age of Onset  . Cancer Neg Hx        Breast/Colon  .  Diabetes Neg Hx   . Heart disease Neg Hx        CAD/MI    ROS:  Pertinent items are noted in HPI.  Otherwise, a comprehensive ROS was negative.  Exam:   BP (!) 146/70 (BP Location: Right Arm, Patient Position: Sitting, Cuff Size: Normal)   Pulse 76   Resp 16   Ht 5\' 5"  (1.651 m)   Wt 165 lb (74.8 kg)   LMP 03/21/1983   BMI 27.46 kg/m     General appearance: alert, cooperative and appears stated age Head: Normocephalic, without obvious abnormality, atraumatic Neck: no adenopathy, supple, symmetrical, trachea midline and thyroid normal to inspection and palpation Lungs: clear to auscultation bilaterally Breasts: normal appearance, no masses or tenderness, No nipple retraction or dimpling, No nipple discharge or bleeding, No axillary or supraclavicular adenopathy Heart: regular rate and rhythm Abdomen: soft, non-tender;  no masses, no organomegaly Extremities: extremities normal, atraumatic, no cyanosis or edema Skin: Skin color, texture, turgor normal. No rashes or lesions Lymph nodes: Cervical, supraclavicular, and axillary nodes normal. No abnormal inguinal nodes palpated Neurologic: Grossly normal  Pelvic: External genitalia:  no lesions              Urethra:  normal appearing urethra with no masses, tenderness or lesions              Bartholins and Skenes: normal                 Vagina: normal appearing vagina with normal color and discharge, no lesions              Cervix:  absent              Pap taken: No. Bimanual Exam:  Uterus:  absent              Adnexa: no mass, fullness, tenderness              Rectal exam: Yes.  .  Confirms.              Anus:  normal sphincter tone, no lesions  Chaperone was present for exam.  Assessment:   Well woman visit with normal exam. Status post TAH/BSO. Menopausal symptoms.  Hx UTIs.    Plan: Mammogram screening discussed.  We will schedule for her.  Recommended self breast awareness. Pap and HR HPV as above. Guidelines for Calcium, Vitamin D, regular exercise program including cardiovascular and weight bearing exercise. Colon cancer screening through PCP. We discussed UTIs and need to see urology if she has recurrent UTIs. Follow up annually and prn.    After visit summary provided.

## 2017-05-25 NOTE — Patient Instructions (Signed)
EXERCISE AND DIET:  We recommended that you start or continue a regular exercise program for good health. Regular exercise means any activity that makes your heart beat faster and makes you sweat.  We recommend exercising at least 30 minutes per day at least 3 days a week, preferably 4 or 5.  We also recommend a diet low in fat and sugar.  Inactivity, poor dietary choices and obesity can cause diabetes, heart attack, stroke, and kidney damage, among others.    ALCOHOL AND SMOKING:  Women should limit their alcohol intake to no more than 7 drinks/beers/glasses of wine (combined, not each!) per week. Moderation of alcohol intake to this level decreases your risk of breast cancer and liver damage. And of course, no recreational drugs are part of a healthy lifestyle.  And absolutely no smoking or even second hand smoke. Most people know smoking can cause heart and lung diseases, but did you know it also contributes to weakening of your bones? Aging of your skin?  Yellowing of your teeth and nails?  CALCIUM AND VITAMIN D:  Adequate intake of calcium and Vitamin D are recommended.  The recommendations for exact amounts of these supplements seem to change often, but generally speaking 600 mg of calcium (either carbonate or citrate) and 800 units of Vitamin D per day seems prudent. Certain women may benefit from higher intake of Vitamin D.  If you are among these women, your doctor will have told you during your visit.    PAP SMEARS:  Pap smears, to check for cervical cancer or precancers,  have traditionally been done yearly, although recent scientific advances have shown that most women can have pap smears less often.  However, every woman still should have a physical exam from her gynecologist every year. It will include a breast check, inspection of the vulva and vagina to check for abnormal growths or skin changes, a visual exam of the cervix, and then an exam to evaluate the size and shape of the uterus and  ovaries.  And after 79 years of age, a rectal exam is indicated to check for rectal cancers. We will also provide age appropriate advice regarding health maintenance, like when you should have certain vaccines, screening for sexually transmitted diseases, bone density testing, colonoscopy, mammograms, etc.   MAMMOGRAMS:  All women over 40 years old should have a yearly mammogram. Many facilities now offer a "3D" mammogram, which may cost around $50 extra out of pocket. If possible,  we recommend you accept the option to have the 3D mammogram performed.  It both reduces the number of women who will be called back for extra views which then turn out to be normal, and it is better than the routine mammogram at detecting truly abnormal areas.    COLONOSCOPY:  Colonoscopy to screen for colon cancer is recommended for all women at age 50.  We know, you hate the idea of the prep.  We agree, BUT, having colon cancer and not knowing it is worse!!  Colon cancer so often starts as a polyp that can be seen and removed at colonscopy, which can quite literally save your life!  And if your first colonoscopy is normal and you have no family history of colon cancer, most women don't have to have it again for 10 years.  Once every ten years, you can do something that may end up saving your life, right?  We will be happy to help you get it scheduled when you are ready.    Be sure to check your insurance coverage so you understand how much it will cost.  It may be covered as a preventative service at no cost, but you should check your particular policy.     Menopause and Herbal Products What is menopause? Menopause is the normal time of life when menstrual periods decrease in frequency and eventually stop completely. This process can take several years for some women. Menopause is complete when you have had an absence of menstruation for a full year since your last menstrual period. It usually occurs between the ages of 48 and  55. It is not common for menopause to begin before the age of 40. During menopause, your body stops producing the female hormones estrogen and progesterone. Common symptoms associated with this loss of hormones (vasomotor symptoms) are:  Hot flashes.  Hot flushes.  Night sweats.  Other common symptoms and complications of menopause include:  Decrease in sex drive.  Vaginal dryness and thinning of the walls of the vagina. This can make sex painful.  Dryness of the skin and development of wrinkles.  Headaches.  Tiredness.  Irritability.  Memory problems.  Weight gain.  Bladder infections.  Hair growth on the face and chest.  Inability to reproduce offspring (infertility).  Loss of density in the bones (osteoporosis) increasing your risk for breaks (fractures).  Depression.  Hardening and narrowing of the arteries (atherosclerosis). This increases your risk of heart attack and stroke.  What treatment options are available? There are many treatment choices for menopause symptoms. The most common treatment is hormone replacement therapy. Many alternative therapies for menopause are emerging, including the use of herbal products. These supplements can be found in the form of herbs, teas, oils, tinctures, and pills. Common herbal supplements for menopause are made from plants that contain phytoestrogens. Phytoestrogens are compounds that occur naturally in plants and plant products. They act like estrogen in the body. Foods and herbs that contain phytoestrogens include:  Soy.  Flax seeds.  Red clover.  Ginseng.  What menopause symptoms may be helped if I use herbal products?  Vasomotor symptoms. These may be helped by: ? Soy. Some studies show that soy may have a moderate benefit for hot flashes. ? Black cohosh. There is limited evidence indicating this may be beneficial for hot flashes.  Symptoms that are related to heart and blood vessel disease. These may be  helped by soy. Studies have shown that soy can help to lower cholesterol.  Depression. This may be helped by: ? St. John's wort. There is limited evidence that shows this may help mild to moderate depression. ? Black cohosh. There is evidence that this may help depression and mood swings.  Osteoporosis. Soy may help to decrease bone loss that is associated with menopause and may prevent osteoporosis. Limited evidence indicates that red clover may offer some bone loss protection as well. Other herbal products that are commonly used during menopause lack enough evidence to support their use as a replacement for conventional menopause therapies. These products include evening primrose, ginseng, and red clover. What are the cases when herbal products should not be used during menopause? Do not use herbal products during menopause without your health care provider's approval if:  You are taking medicine.  You have a preexisting liver condition.  Are there any risks in my taking herbal products during menopause? If you choose to use herbal products to help with symptoms of menopause, keep in mind that:  Different supplements have different and unmeasured   amounts of herbal ingredients.  Herbal products are not regulated the same way that medicines are.  Concentrations of herbs may vary depending on the way they are prepared. For example, the concentration may be different in a pill, tea, oil, and tincture.  Little is known about the risks of using herbal products, particularly the risks of long-term use.  Some herbal supplements can be harmful when combined with certain medicines.  Most commonly reported side effects of herbal products are mild. However, if used improperly, many herbal supplements can cause serious problems. Talk to your health care provider before starting any herbal product. If problems develop, stop taking the supplement and let your health care provider know. This  information is not intended to replace advice given to you by your health care provider. Make sure you discuss any questions you have with your health care provider. Document Released: 08/23/2007 Document Revised: 02/01/2016 Document Reviewed: 08/19/2013 Elsevier Interactive Patient Education  2017 [employer]  

## 2017-05-25 NOTE — Progress Notes (Signed)
Patient scheduled for screening mammogram 06-13-17 3:10pm at [facility].

## 2017-06-03 DIAGNOSIS — R300 Dysuria: Secondary | ICD-10-CM | POA: Diagnosis not present

## 2017-06-03 DIAGNOSIS — R3129 Other microscopic hematuria: Secondary | ICD-10-CM | POA: Diagnosis not present

## 2017-06-04 ENCOUNTER — Telehealth: Payer: Self-pay | Admitting: Internal Medicine

## 2017-06-04 NOTE — Telephone Encounter (Signed)
noted 

## 2017-06-04 NOTE — Telephone Encounter (Signed)
Patient called [facility] on 06/03/17 stating that she gets frequent UTI's and has had one in the last 6 months.  States she has burning when she urinates.  No fever, chills or nausea.  I have followed up with patient in regard.  States she was treated at a walk in clinic.  States if she does not get any better she will call back to make appointment.

## 2017-06-08 DIAGNOSIS — R3129 Other microscopic hematuria: Secondary | ICD-10-CM | POA: Diagnosis not present

## 2017-06-08 DIAGNOSIS — R8290 Unspecified abnormal findings in urine: Secondary | ICD-10-CM | POA: Diagnosis not present

## 2017-06-13 ENCOUNTER — Ambulatory Visit
Admission: RE | Admit: 2017-06-13 | Discharge: 2017-06-13 | Disposition: A | Discharge status: Home / Self Care | Payer: Medicare Other | Source: Ambulatory Visit | Attending: Obstetrics and Gynecology | Admitting: Obstetrics and Gynecology

## 2017-06-13 DIAGNOSIS — Z1231 Encounter for screening mammogram for malignant neoplasm of breast: Secondary | ICD-10-CM | POA: Diagnosis not present

## 2017-06-13 DIAGNOSIS — Z139 Encounter for screening, unspecified: Secondary | ICD-10-CM

## 2017-07-04 ENCOUNTER — Ambulatory Visit: Payer: Medicare Other | Admitting: Internal Medicine

## 2017-07-04 ENCOUNTER — Encounter: Payer: Self-pay | Admitting: Internal Medicine

## 2017-07-04 ENCOUNTER — Ambulatory Visit (INDEPENDENT_AMBULATORY_CARE_PROVIDER_SITE_OTHER)
Admission: RE | Admit: 2017-07-04 | Discharge: 2017-07-04 | Disposition: A | Discharge status: Home / Self Care | Payer: Medicare Other | Source: Ambulatory Visit | Attending: Internal Medicine | Admitting: Internal Medicine

## 2017-07-04 DIAGNOSIS — R102 Pelvic and perineal pain: Secondary | ICD-10-CM

## 2017-07-04 DIAGNOSIS — M1611 Unilateral primary osteoarthritis, right hip: Secondary | ICD-10-CM | POA: Diagnosis not present

## 2017-07-04 LAB — POCT URINALYSIS DIPSTICK
BILIRUBIN UA: NEGATIVE
GLUCOSE UA: NEGATIVE
Ketones, UA: NEGATIVE
Leukocytes, UA: NEGATIVE
Nitrite, UA: NEGATIVE
Protein, UA: NEGATIVE
RBC UA: NEGATIVE
SPEC GRAV UA: 1.025 (ref 1.010–1.025)
Urobilinogen, UA: 0.2 E.U./dL
pH, UA: 6 (ref 5.0–8.0)

## 2017-07-04 NOTE — Progress Notes (Signed)
   Subjective:    Patient ID: Kaylee Davis, female    DOB: 07-06-1940, 77 y.o.   MRN: [id number]  HPI The patient is a 77 YO female coming in for several UTI recently. She has undergone several long courses of antibiotics which did clear the infection. She does not have one now she thinks but is having some pelvic soreness. She has had the pelvic soreness for about 6 weeks now. Has had both knee replacement and the right knee has more stiffness and some sciatic pain. She has been stretching and the sciatic pain is lessened. She borrowed her husband's celebrex to see if this would help more and it has helped in the last 2 days.   Review of Systems  Constitutional: Negative.   HENT: Negative.   Eyes: Negative.   Respiratory: Negative for cough, chest tightness and shortness of breath.   Cardiovascular: Negative for chest pain, palpitations and leg swelling.  Gastrointestinal: Negative for abdominal distention, abdominal pain, constipation, diarrhea, nausea and vomiting.  Genitourinary: Positive for pelvic pain. Negative for difficulty urinating, dysuria, flank pain, frequency, genital sores and urgency.  Musculoskeletal: Negative.   Skin: Negative.   Neurological: Negative.       Objective:   Physical Exam  Constitutional: She is oriented to person, place, and time. She appears well-developed and well-nourished.  HENT:  Head: Normocephalic and atraumatic.  Eyes: EOM are normal.  Neck: Normal range of motion.  Cardiovascular: Normal rate and regular rhythm.  Pulmonary/Chest: Effort normal and breath sounds normal. No respiratory distress. She has no wheezes. She has no rales.  Abdominal: Soft. Bowel sounds are normal. She exhibits no distension. There is tenderness. There is no rebound.  Some pain along the right inguinal region mild  Neurological: She is alert and oriented to person, place, and time. Coordination normal.  Skin: Skin is warm and dry.   Vitals:   07/04/17 1015    BP: 140/84  Pulse: 74  Temp: 98 F (36.7 C)  TempSrc: Oral  SpO2: 96%  Weight: 167 lb (75.8 kg)  Height: 5\' 5"  (1.651 m)      Assessment & Plan:

## 2017-07-04 NOTE — Patient Instructions (Signed)
There are no signs of urine infection today.   We will check the x-ray of the hips and go from there.

## 2017-07-04 NOTE — Assessment & Plan Note (Signed)
Suspect muscular or tendon problem. U/A done in the office not consistent with infection. Checking hip x-ray to check for arthritis.

## 2017-07-23 ENCOUNTER — Other Ambulatory Visit: Payer: Medicare Other

## 2017-07-23 ENCOUNTER — Ambulatory Visit: Payer: Medicare Other | Admitting: Family

## 2017-07-23 ENCOUNTER — Encounter: Payer: Self-pay | Admitting: Family

## 2017-07-23 VITALS — BP 134/72 | HR 68 | Temp 97.5°F | Ht 65.0 in | Wt 165.0 lb

## 2017-07-23 DIAGNOSIS — R300 Dysuria: Secondary | ICD-10-CM

## 2017-07-23 DIAGNOSIS — Z87440 Personal history of urinary (tract) infections: Secondary | ICD-10-CM

## 2017-07-23 LAB — POC URINALSYSI DIPSTICK (AUTOMATED)
BILIRUBIN UA: NEGATIVE
Blood, UA: 1
Glucose, UA: NEGATIVE
KETONES UA: NEGATIVE
NITRITE UA: NEGATIVE
PROTEIN UA: NEGATIVE
Spec Grav, UA: 1.015 (ref 1.010–1.025)
Urobilinogen, UA: 0.2 E.U./dL
pH, UA: 6 (ref 5.0–8.0)

## 2017-07-23 MED ORDER — CIPROFLOXACIN HCL 500 MG PO TABS: 500.0000 mg | ORAL_TABLET | Freq: Two times a day (BID) | ORAL | 0 refills | Status: DC

## 2017-07-23 NOTE — Progress Notes (Signed)
Kaylee Davis is a 77 y.o. female with the following history as recorded in [employer]:  Patient Active Problem List   Diagnosis Date Noted  . Pelvic pain 07/04/2017  . OA (osteoarthritis) of knee 05/28/2015  . Routine health maintenance 02/03/2011  . Essential hypertension 02/26/2007  . PSVT 02/26/2007  . GERD (gastroesophageal reflux disease) 02/26/2007    Current Outpatient Medications  Medication Sig Dispense Refill  . amLODipine (NORVASC) 10 MG tablet TAKE 1 BY MOUTH EVERY MORNING 90 tablet 3  . esomeprazole (NEXIUM) 20 MG capsule Take 1 capsule (20 mg total) by mouth every morning. 90 capsule 3  . furosemide (LASIX) 40 MG tablet Take 1 tablet (40 mg total) by mouth daily. 90 tablet 3  . meloxicam (MOBIC) 15 MG tablet Take 1 tablet (15 mg total) by mouth daily. 90 tablet 3  . ciprofloxacin (CIPRO) 500 MG tablet Take 1 tablet (500 mg total) by mouth 2 (two) times daily. 10 tablet 0   No current facility-administered medications for this visit.     Allergies: Nitrofuran derivatives; Other; Penicillins; and Sulfonamide derivatives  Past Medical History:  Diagnosis Date  . Arthritis    OA BOTH KNEES; OCCAS PAIN LEFT SHOULDER - HX OF ROTATOR CUFF PROBLEM  . Dyspepsia   . GERD (gastroesophageal reflux disease)   . GI bleed    GI BLEED FROM DUODENAL ULCER--ABOUT 17 YRS AGO - GIVEN TRANSFUSIONS  . History of blood transfusion    1990's  . History of frequent urinary tract infections   . History of PSVT (paroxysmal supraventricular tachycardia)    can control by doing a valsalva. Triggered by elevated heart rate  . Hypertension   . Imbalance   . Migraine headache    much reduced frequency  . Tachycardia    Episodic  . Tingling    left foot   . TMJ (temporomandibular joint disorder)    PAST HX GRINDING TEETH --BUT NO LONGER A PROBLEM  . Wears glasses     Past Surgical History:  Procedure Laterality Date  . ABDOMINAL HYSTERECTOMY  1985   TAH/BSO--for ?fibroids  .  APPENDECTOMY    . ARTHROPLASTY     left Knee   . cyst removed      from mouth  . KNEE ARTHROSCOPY Left 10/2005  . LAPAROSCOPIC APPENDECTOMY  02/2000  . REPLACEMENT TOTAL KNEE Right   . TONSILLECTOMY    . 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 TOTAL KNEE ARTHROPLASTY Right 05/28/2015   Procedure: RIGHT TOTAL KNEE ARTHROPLASTY;  Surgeon: Ollen Gross, MD;  Location: [facility];  Service: Orthopedics;  Laterality: Right;  . WISDOM TOOTH EXTRACTION  2008    Family History  Problem Relation Age of Onset  . Cancer Neg Hx        Breast/Colon  . Diabetes Neg Hx   . Heart disease Neg Hx        CAD/MI    Social History   Tobacco Use  . Smoking status: Never Smoker  . Smokeless tobacco: Never Used  Substance Use Topics  . Alcohol use: No    Alcohol/week: 0.0 oz    Comment: no     Subjective:  Patient presents with concerns for possible UTI; notes that symptoms started in the past 24 hours; + burning, urgency; denies any blood in the urine; feels that she has been having  more UTIs recently; was seen at [facility] in mid- March and treated for UTI and treated with combination of Cipro and Bactrim; has had at least 3 UTIs since the end of December;    Objective:  Vitals:   07/23/17 1329  BP: 134/72  Pulse: 68  Temp: (!) 97.5 F (36.4 C)  TempSrc: Oral  SpO2: 98%  Weight: 165 lb (74.8 kg)  Height:  (1.651 m)    General: Well developed, well nourished, in no acute distress  Skin : Warm and dry.  Head: Normocephalic and atraumatic  Lungs: Respirations unlabored; clear to auscultation bilaterally without wheeze, rales, rhonchi  CVS exam: normal rate and regular rhythm.  Musculoskeletal: No deformities; no active joint inflammation; negative CVA tenderness  Neurologic: Alert and oriented; speech intact; face symmetrical; moves all extremities well;  CNII-XII intact without focal deficit  Assessment:  1. Dysuria   2. History of recurrent UTIs     Plan:  1. Check U/A and urine culture today; Rx for Cipro 500 mg bid x 5 days; increase fluids, rest and follow up worse, no better; 2. Refer to urology for further evaluation.   No follow-ups on file.  Orders Placed This Encounter  Procedures  . Urine Culture    Standing Status:   Future    Standing Expiration Date:   07/23/2018  . Ambulatory referral to Urology    Referral Priority:   Routine    Referral Type:   Consultation    Referral Reason:   Specialty Services Required    Requested Specialty:   Urology    Number of Visits Requested:   1    Requested Prescriptions   Signed Prescriptions Disp Refills  . ciprofloxacin (CIPRO) 500 MG tablet 10 tablet 0    Sig: Take 1 tablet (500 mg total) by mouth 2 (two) times daily.

## 2017-07-23 NOTE — Addendum Note (Signed)
Addended by: Karma Ganja on: 07/23/2017 03:06 PM   Modules accepted: Orders

## 2017-07-25 LAB — URINE CULTURE
MICRO NUMBER:: 90549331
SPECIMEN QUALITY: ADEQUATE

## 2017-08-23 DIAGNOSIS — N952 Postmenopausal atrophic vaginitis: Secondary | ICD-10-CM | POA: Diagnosis not present

## 2017-08-23 DIAGNOSIS — N3020 Other chronic cystitis without hematuria: Secondary | ICD-10-CM | POA: Diagnosis not present

## 2017-10-04 DIAGNOSIS — N3000 Acute cystitis without hematuria: Secondary | ICD-10-CM | POA: Diagnosis not present

## 2017-10-04 DIAGNOSIS — R300 Dysuria: Secondary | ICD-10-CM | POA: Diagnosis not present

## 2017-10-04 DIAGNOSIS — R350 Frequency of micturition: Secondary | ICD-10-CM | POA: Diagnosis not present

## 2017-10-09 DIAGNOSIS — H2513 Age-related nuclear cataract, bilateral: Secondary | ICD-10-CM | POA: Diagnosis not present

## 2017-10-09 DIAGNOSIS — H524 Presbyopia: Secondary | ICD-10-CM | POA: Diagnosis not present

## 2017-10-09 DIAGNOSIS — H40053 Ocular hypertension, bilateral: Secondary | ICD-10-CM | POA: Diagnosis not present

## 2017-10-25 DIAGNOSIS — M1611 Unilateral primary osteoarthritis, right hip: Secondary | ICD-10-CM | POA: Diagnosis not present

## 2017-10-25 DIAGNOSIS — M170 Bilateral primary osteoarthritis of knee: Secondary | ICD-10-CM | POA: Diagnosis not present

## 2017-11-07 DIAGNOSIS — M1611 Unilateral primary osteoarthritis, right hip: Secondary | ICD-10-CM | POA: Diagnosis not present

## 2017-12-06 DIAGNOSIS — M1611 Unilateral primary osteoarthritis, right hip: Secondary | ICD-10-CM | POA: Diagnosis not present

## 2017-12-19 ENCOUNTER — Other Ambulatory Visit: Payer: Self-pay

## 2017-12-19 MED ORDER — MELOXICAM 15 MG PO TABS: 15.0000 mg | ORAL_TABLET | Freq: Every day | ORAL | 0 refills | Status: DC

## 2017-12-19 MED ORDER — AMLODIPINE BESYLATE 10 MG PO TABS: ORAL_TABLET | ORAL | 0 refills | Status: DC

## 2017-12-27 DIAGNOSIS — N3020 Other chronic cystitis without hematuria: Secondary | ICD-10-CM | POA: Diagnosis not present

## 2017-12-27 DIAGNOSIS — N952 Postmenopausal atrophic vaginitis: Secondary | ICD-10-CM | POA: Diagnosis not present

## 2018-01-02 DIAGNOSIS — M1611 Unilateral primary osteoarthritis, right hip: Secondary | ICD-10-CM | POA: Diagnosis not present

## 2018-01-10 ENCOUNTER — Telehealth: Payer: Self-pay | Admitting: *Deleted

## 2018-01-10 ENCOUNTER — Encounter: Payer: Self-pay | Admitting: Internal Medicine

## 2018-01-10 ENCOUNTER — Other Ambulatory Visit (INDEPENDENT_AMBULATORY_CARE_PROVIDER_SITE_OTHER): Payer: Medicare Other

## 2018-01-10 ENCOUNTER — Ambulatory Visit (INDEPENDENT_AMBULATORY_CARE_PROVIDER_SITE_OTHER): Payer: Medicare Other | Admitting: Internal Medicine

## 2018-01-10 VITALS — BP 130/64 | HR 67 | Temp 98.1°F | Ht 65.0 in | Wt 164.0 lb

## 2018-01-10 DIAGNOSIS — Z01818 Encounter for other preprocedural examination: Secondary | ICD-10-CM

## 2018-01-10 DIAGNOSIS — K219 Gastro-esophageal reflux disease without esophagitis: Secondary | ICD-10-CM

## 2018-01-10 DIAGNOSIS — Z0000 Encounter for general adult medical examination without abnormal findings: Secondary | ICD-10-CM

## 2018-01-10 DIAGNOSIS — I10 Essential (primary) hypertension: Secondary | ICD-10-CM

## 2018-01-10 DIAGNOSIS — Z23 Encounter for immunization: Secondary | ICD-10-CM

## 2018-01-10 LAB — COMPREHENSIVE METABOLIC PANEL
ALBUMIN: 4.5 g/dL (ref 3.5–5.2)
ALK PHOS: 113 U/L (ref 39–117)
ALT: 9 U/L (ref 0–35)
AST: 15 U/L (ref 0–37)
BILIRUBIN TOTAL: 0.3 mg/dL (ref 0.2–1.2)
BUN: 15 mg/dL (ref 6–23)
CALCIUM: 9.5 mg/dL (ref 8.4–10.5)
CO2: 27 mEq/L (ref 19–32)
Chloride: 102 mEq/L (ref 96–112)
Creatinine, Ser: 1.19 mg/dL (ref 0.40–1.20)
GFR: 46.43 mL/min — AB (ref >60.00)
GLUCOSE: 98 mg/dL (ref 70–99)
POTASSIUM: 3.9 meq/L (ref 3.5–5.1)
Sodium: 139 mEq/L (ref 135–145)
TOTAL PROTEIN: 7.5 g/dL (ref 6.0–8.3)

## 2018-01-10 LAB — LIPID PANEL
CHOLESTEROL: 215 mg/dL — AB (ref 0–200)
HDL: 52.1 mg/dL (ref >39.00)
NonHDL: 162.6
TRIGLYCERIDES: 221 mg/dL — AB (ref 0.0–149.0)
Total CHOL/HDL Ratio: 4
VLDL: 44.2 mg/dL — AB (ref 0.0–40.0)

## 2018-01-10 LAB — CBC
HEMATOCRIT: 39.4 % (ref 36.0–46.0)
HEMOGLOBIN: 13.3 g/dL (ref 12.0–15.0)
MCHC: 33.8 g/dL (ref 30.0–36.0)
MCV: 85.2 fl (ref 78.0–100.0)
PLATELETS: 360 10*3/uL (ref 150.0–400.0)
RBC: 4.63 Mil/uL (ref 3.87–5.11)
RDW: 13.7 % (ref 11.5–15.5)
WBC: 8.9 10*3/uL (ref 4.0–10.5)

## 2018-01-10 LAB — LDL CHOLESTEROL, DIRECT: Direct LDL: 130 mg/dL

## 2018-01-10 NOTE — Telephone Encounter (Signed)
LVM for patient to call back in regards to scheduling AWV with our health coach.  

## 2018-01-10 NOTE — Progress Notes (Signed)
   Subjective:    Patient ID: Kaylee Davis, female    DOB: 1941/02/13, 77 y.o.   MRN: [id number]  HPI Here for medicare wellness and physical, no new complaints. Please see A/P for status and treatment of chronic medical problems.   Diet: heart healthy Physical activity: sedentary due to right hip pain Depression/mood screen: negative Hearing: intact to whispered voice Visual acuity: grossly normal, , performs annual eye exam  ADLs: capable Fall risk: none Home safety: good Cognitive evaluation: intact to orientation, naming, recall and repetition EOL planning: adv directives discussed, in place  I have personally reviewed and have noted 1. The patient's medical and social history - reviewed today no changes 2. Their use of alcohol, tobacco or illicit drugs 3. Their current medications and supplements 4. The patient's functional ability including ADL's, fall risks, home safety risks and hearing or visual impairment. 5. Diet and physical activities 6. Evidence for depression or mood disorders 7. Care team reviewed and updated (available in snapshot)  Review of Systems  Constitutional: Negative.   HENT: Negative.   Eyes: Negative.   Respiratory: Negative for cough, chest tightness and shortness of breath.   Cardiovascular: Negative for chest pain, palpitations and leg swelling.  Gastrointestinal: Negative for abdominal distention, abdominal pain, constipation, diarrhea, nausea and vomiting.  Musculoskeletal: Positive for arthralgias and gait problem.  Skin: Negative.   Psychiatric/Behavioral: Negative.       Objective:   Physical Exam  Constitutional: She is oriented to person, place, and time. She appears well-developed and well-nourished.  HENT:  Head: Normocephalic and atraumatic.  Eyes: EOM are normal.  Neck: Normal range of motion.  Cardiovascular: Normal rate and regular rhythm.  Pulmonary/Chest: Effort normal and breath sounds normal. No respiratory distress.  She has no wheezes. She has no rales.  Abdominal: Soft. Bowel sounds are normal. She exhibits no distension. There is no tenderness. There is no rebound.  Musculoskeletal: She exhibits no edema.  Neurological: She is alert and oriented to person, place, and time. Coordination abnormal.  Cane, slow gait  Skin: Skin is warm and dry.  Psychiatric: She has a normal mood and affect.   Vitals:   01/10/18 1440  BP: 130/64  Pulse: 67  Temp: 98.1 F (36.7 C)  TempSrc: Oral  SpO2: 97%  Weight: 164 lb (74.4 kg)  Height: 5\' 5"  (1.651 m)   EKG: Rate 61, axis normal, sinus, pr prolonged similar to prior, no st or t wave changes, overall no change when compared to 2017.     Assessment & Plan:  Flu shot given at visit

## 2018-01-10 NOTE — Patient Instructions (Signed)

## 2018-01-11 NOTE — Assessment & Plan Note (Signed)
Taking amlodipine and lasix. BP at goal. Checking CMP and adjust as needed.

## 2018-01-11 NOTE — Assessment & Plan Note (Signed)
Stable on nexium and continue.

## 2018-01-11 NOTE — Assessment & Plan Note (Signed)
EKG done for pre-op clearance without changes. Flu shot given. Pneumonia complete. Shingrix complete. Tetanus up to date. Colonoscopy up to date. Mammogram up to date, pap smear aged out and dexa up to date. Counseled about sun safety and mole surveillance. Counseled about the dangers of distracted driving. Given 10 year screening recommendations.

## 2018-01-17 DIAGNOSIS — M1611 Unilateral primary osteoarthritis, right hip: Secondary | ICD-10-CM | POA: Diagnosis not present

## 2018-02-02 IMAGING — MR MR ABDOMEN WO/W CM MRCP
10 of 23 series · 21 of 48 positions shown · IV contrast (multihance)
Comparison: Ultrasound 02/16/2016

CLINICAL DATA: eval for possible common bile duct dilatation. onset
of severe chest pain. She went to bed feeling completely well but
was awoken around 11pm yesterday with severe mid chest pain
radiating through to her back. No associated shor...*comment was
truncated*^15mL MULTIHANCE GADOBENATE DIMEGLUMINE 529 MG/ML IV
SOLN

EXAM:
MRI ABDOMEN WITHOUT AND WITH CONTRAST (INCLUDING MRCP)
TECHNIQUE: Multiplanar multisequence MR imaging of the abdomen was performed
both before and after the administration of intravenous contrast.
Heavily T2-weighted images of the biliary and pancreatic ducts were
obtained, and three-dimensional MRCP images were rendered by post
processing.
CONTRAST:  15mL MULTIHANCE GADOBENATE DIMEGLUMINE 529 MG/ML IV SOLN

[Series 3: T2 fat-sat · axial · 5.0mm · 0.78mm/px · z∈[-93,+152]mm · 2 of 50 slices shown]
[im 1/50]
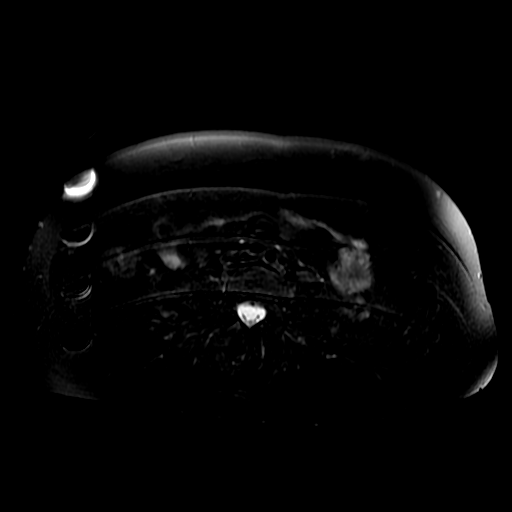
[im 50/50]
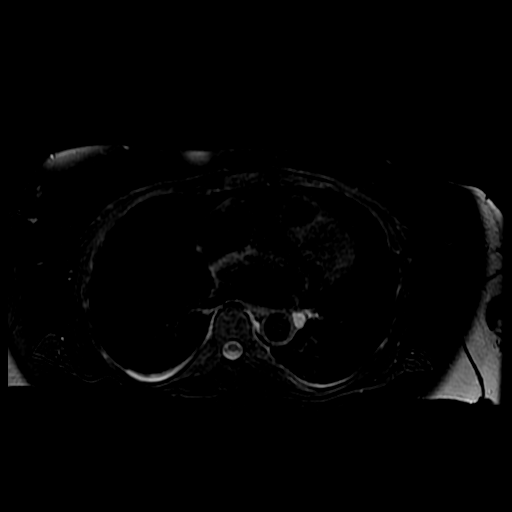

[Series 5: MRCP · coronal · 2.0mm · 0.70mm/px · 2 of 39 slices shown (1 of 2)]
[im 1/39]
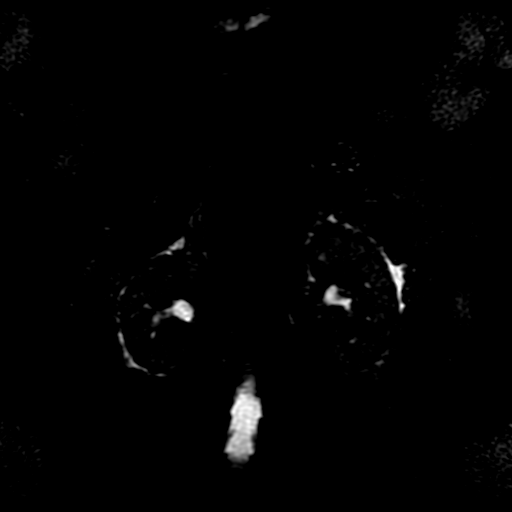
[im 39/39]
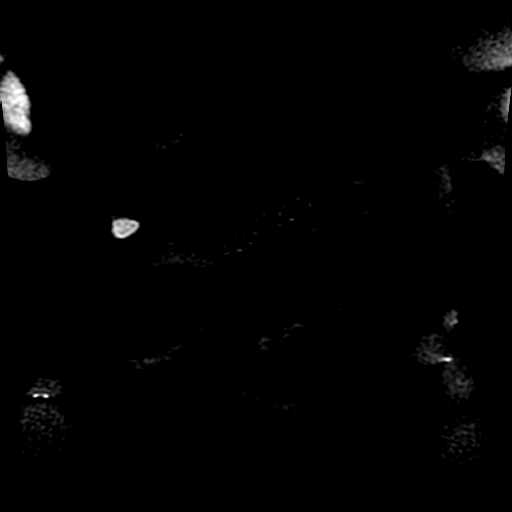

[Series 6: DWI b500 · axial · 6.0mm · 1.48mm/px · z∈[-92,+135]mm · 3 of 60 slices shown]
[im 1/60]
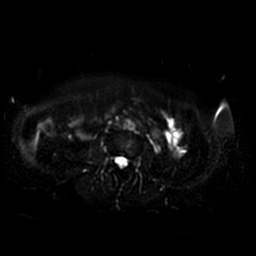
[im 30/60]
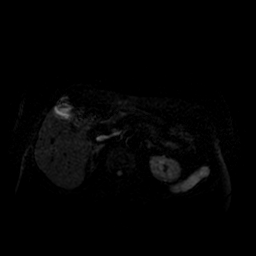
[im 60/60]
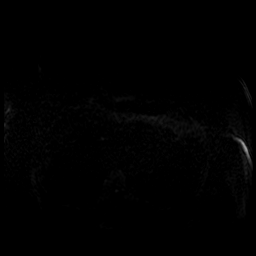

[Series 7: ax dualecho · axial · 5.0mm · 0.78mm/px · z∈[-67,+128]mm · 2 of 80 slices shown]
[im 1/80]
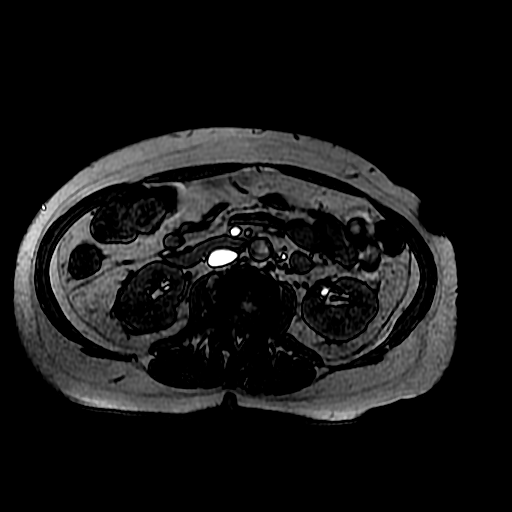
[im 80/80]
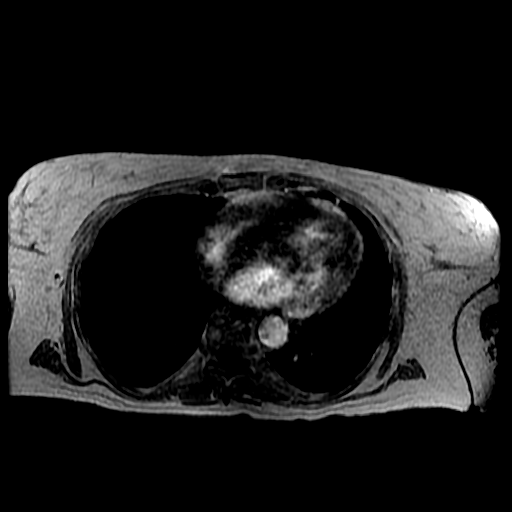

[Series 8: MRCP · coronal · 40.0mm · 0.70mm/px · 1 of 6 slices shown (2 of 2)]
[im 1/6]
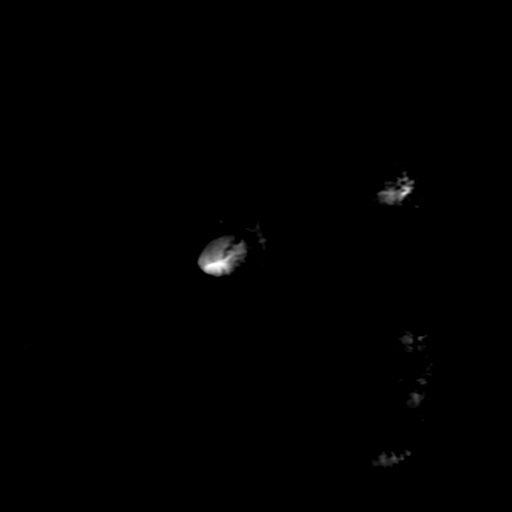

[Series 9: bSSFP fat-sat · coronal · 5.0mm · 0.70mm/px · 1 of 37 slices shown]
[im 1/37]
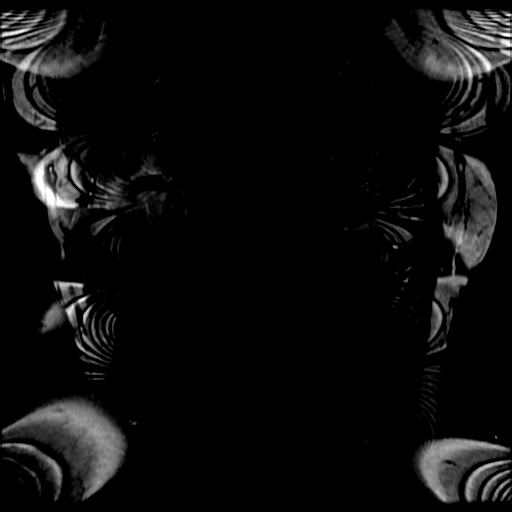

[Series 10: T2 · axial · 5.0mm · 0.78mm/px · 1 of 40 slices shown]
[im 1/40]
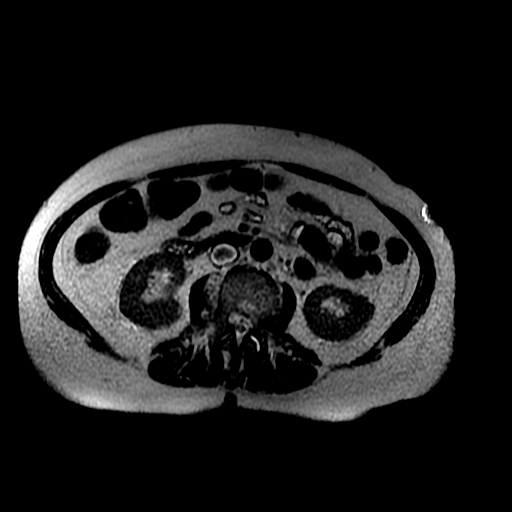

[Series 12: T1 dynamic · coronal · delayed · 5.0mm · 0.78mm/px · 2 of 88 slices shown]
[im 1/88]
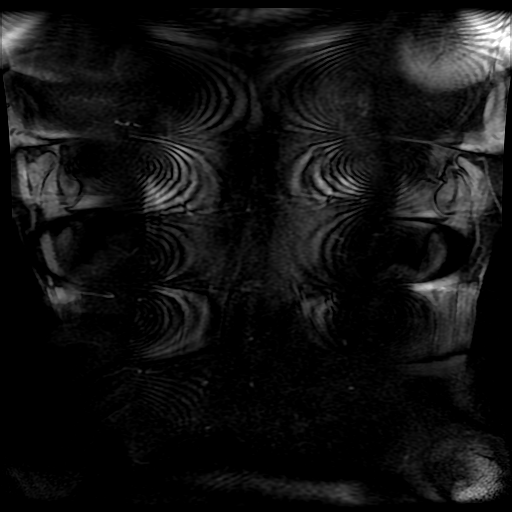
[im 88/88]
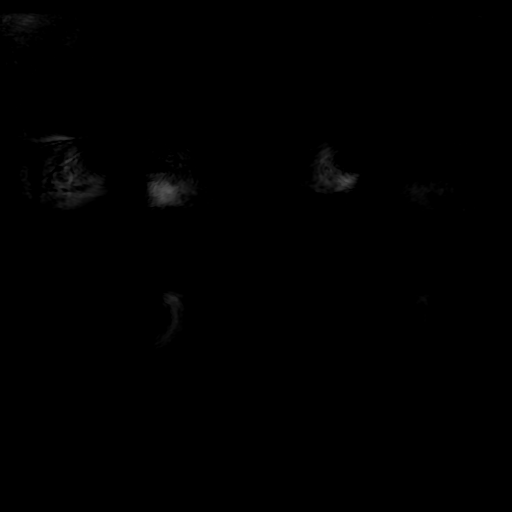

[Series 400: reformatted · axial · 1.6mm · 0.62mm/px · z∈[-15,+115]mm · 3 of 111 slices shown (1 of 2)]
[im 1/111]
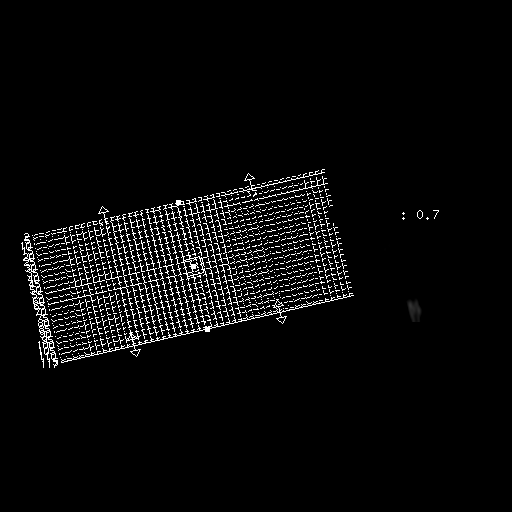
[im 56/111]
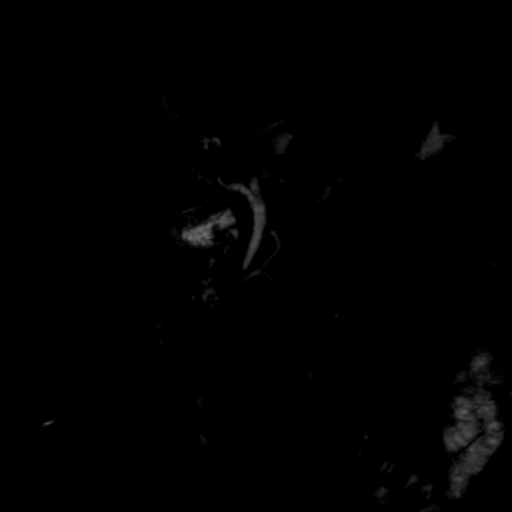
[im 111/111]
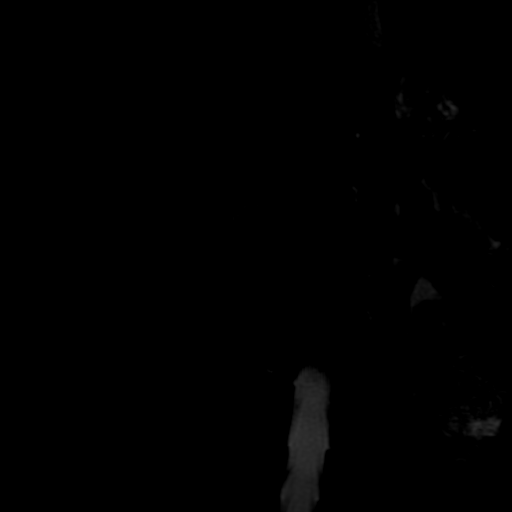

[Series 401: reformatted · coronal · 100.0mm · 0.51mm/px · 4 of 160 slices shown (2 of 2)]
[im 1/160]
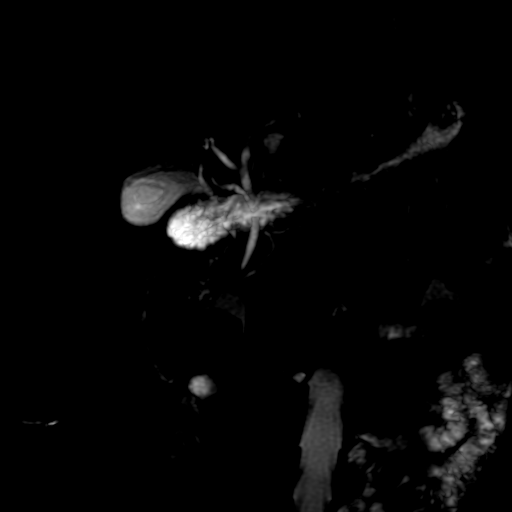
[im 54/160]
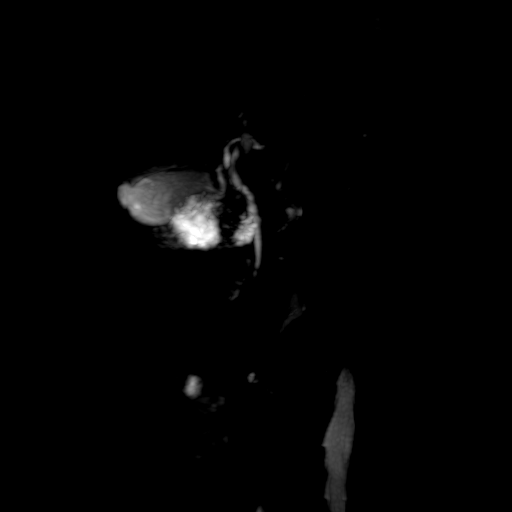
[im 107/160]
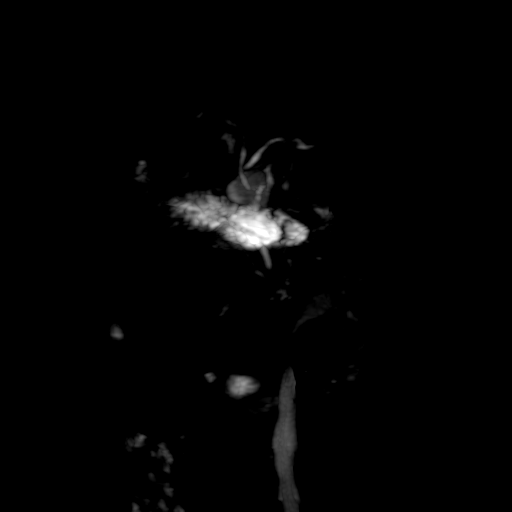
[im 160/160]
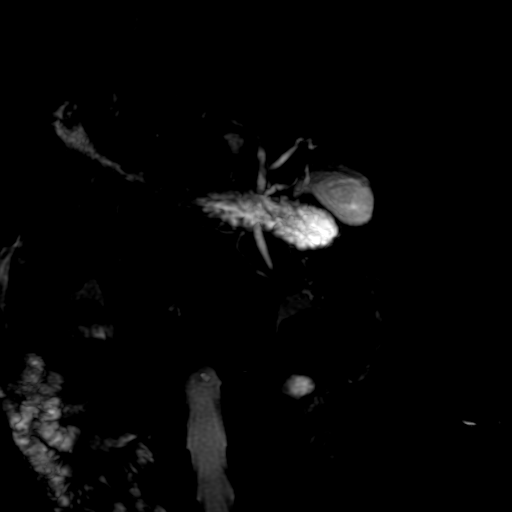

[21 of 48 positions shown; findings below may reference images not displayed]

FINDINGS: Lower chest:  Lung bases are clear.

Hepatobiliary: No focal hepatic lesion. No intrahepatic duct
dilatation. The common hepatic duct and common bile duct are normal
caliber. The common bile duct measures 4 mm. There is no filling
defect within the common bile duct. No external compression.
Gallbladder is normal. No gallstones

Pancreas: Normal pancreatic parenchyma. No duct dilatation. Note
variant ductal anatomy.

Spleen: Normal spleen.

Adrenals/urinary tract: Adrenal glands and kidneys are normal.

Stomach/Bowel: Stomach and limited of the small bowel is
unremarkable

Vascular/Lymphatic: Abdominal aortic normal caliber. No
retroperitoneal periportal lymphadenopathy.

Musculoskeletal: No aggressive osseous lesion
IMPRESSION: 1. Normal biliary tree.  No choledocholithiasis.
2. Normal gallbladder.
3. Normal pancreas

## 2018-02-27 NOTE — Progress Notes (Signed)
01/10/2018- noted in Epic- EKG  02/17/2016- noted in Epic-ECHO

## 2018-02-27 NOTE — Patient Instructions (Addendum)
Kaylee Davis  02/27/2018   Your procedure is scheduled on: Wednesday 03/06/2018  [facility]              Report to admitting at  0600  AM    Call this number if you have problems the morning of surgery [phone]    Remember: Do not eat food or drink liquids :After Midnight.               BRUSH YOUR TEETH MORNING OF SURGERY AND RINSE YOUR MOUTH OUT, NO CHEWING GUM CANDY OR MINTS.     Take these medicines the morning of surgery with A SIP OF WATER: Amlodipine (Norvasc), Esomeprazole (Nexium)                                You may not have any metal on your body including hair pins and              piercings  Do not wear jewelry, make-up, lotions, powders or perfumes, deodorant             Do not wear nail polish.  Do not shave  48 hours prior to surgery.                Do not bring valuables to the hospital. [name] IS NOT             RESPONSIBLE   FOR VALUABLES.  Contacts, dentures or bridgework may not be worn into surgery.  Leave suitcase in the car. After surgery it may be brought to your room.                   Please read over the following fact sheets you were given: _____________________________________________________________________             [facility] - Preparing for [facility] Before surgery, you can play an important role.  Because skin is not sterile, your skin needs to be as free of germs as possible.  You can reduce the number of germs on your skin by washing with CHG (chlorahexidine gluconate) soap before surgery.  CHG is an antiseptic cleaner which kills germs and bonds with the skin to continue killing germs even after washing. Please DO NOT use if you have an allergy to CHG or antibacterial soaps.  If your skin becomes reddened/irritated stop using the CHG and inform your nurse when you arrive at Short Stay. Do not shave (including legs and underarms) for at least 48 hours prior to the first  CHG shower.  You may shave your face/neck. Please follow these instructions carefully:  1.  Shower with CHG Soap the night before surgery and the  morning of Surgery.  2.  If you choose to wash your hair, wash your hair first as usual with your  normal  shampoo.  3.  After you shampoo, rinse your hair and body thoroughly to remove the  shampoo.                           4.  Use CHG as you would any other liquid soap.  You can apply chg directly  to the skin and wash  Gently with a scrungie or clean washcloth.  5.  Apply the CHG Soap to your body ONLY FROM THE NECK DOWN.   Do not use on face/ open                           Wound or open sores. Avoid contact with eyes, ears mouth and genitals (private parts).                       Wash face,  Genitals (private parts) with your normal soap.             6.  Wash thoroughly, paying special attention to the area where your surgery  will be performed.  7.  Thoroughly rinse your body with warm water from the neck down.  8.  DO NOT shower/wash with your normal soap after using and rinsing off  the CHG Soap.                9.  Pat yourself dry with a clean towel.            10.  Wear clean pajamas.            11.  Place clean sheets on your bed the night of your first shower and do not  sleep with pets. Day of Surgery : Do not apply any lotions/deodorants the morning of surgery.  Please wear clean clothes to the hospital/surgery center.  FAILURE TO FOLLOW THESE INSTRUCTIONS MAY RESULT IN THE CANCELLATION OF YOUR SURGERY PATIENT SIGNATURE_________________________________  NURSE SIGNATURE__________________________________  ________________________________________________________________________   [name]  An incentive spirometer is a tool that can help keep your lungs clear and active. This tool measures how well you are filling your lungs with each breath. Taking long deep breaths may help reverse or decrease the  chance of developing breathing (pulmonary) problems (especially infection) following:  A long period of time when you are unable to move or be active. BEFORE THE PROCEDURE   If the spirometer includes an indicator to show your best effort, your nurse or respiratory therapist will set it to a desired goal.  If possible, sit up straight or lean slightly forward. Try not to slouch.  Hold the incentive spirometer in an upright position. INSTRUCTIONS FOR USE  1. Sit on the edge of your bed if possible, or sit up as far as you can in bed or on a chair. 2. Hold the incentive spirometer in an upright position. 3. Breathe out normally. 4. Place the mouthpiece in your mouth and seal your lips tightly around it. 5. Breathe in slowly and as deeply as possible, raising the piston or the ball toward the top of the column. 6. Hold your breath for 3-5 seconds or for as long as possible. Allow the piston or ball to fall to the bottom of the column. 7. Remove the mouthpiece from your mouth and breathe out normally. 8. Rest for a few seconds and repeat Steps 1 through 7 at least 10 times every 1-2 hours when you are awake. Take your time and take a few normal breaths between deep breaths. 9. The spirometer may include an indicator to show your best effort. Use the indicator as a goal to work toward during each repetition. 10. After each set of 10 deep breaths, practice coughing to be sure your lungs are clear. If you have an incision (the cut made at the time of surgery),  support your incision when coughing by placing a pillow or rolled up towels firmly against it. Once you are able to get out of bed, walk around indoors and cough well. You may stop using the incentive spirometer when instructed by your caregiver.  RISKS AND COMPLICATIONS  Take your time so you do not get dizzy or light-headed.  If you are in pain, you may need to take or ask for pain medication before doing incentive spirometry. It is harder  to take a deep breath if you are having pain. AFTER USE  Rest and breathe slowly and easily.  It can be helpful to keep track of a log of your progress. Your caregiver can provide you with a simple table to help with this. If you are using the spirometer at home, follow these instructions: [place] IF:   You are having difficultly using the spirometer.  You have trouble using the spirometer as often as instructed.  Your pain medication is not giving enough relief while using the spirometer.  You develop fever of 100.5 F (38.1 C) or higher. SEEK IMMEDIATE MEDICAL CARE IF:   You cough up bloody sputum that had not been present before.  You develop fever of 102 F (38.9 C) or greater.  You develop worsening pain at or near the incision site. MAKE SURE YOU:   Understand these instructions.  Will watch your condition.  Will get help right away if you are not doing well or get worse. Document Released: 07/17/2006 Document Revised: 05/29/2011 Document Reviewed: 09/17/2006 [employer] Patient Information 2014 [employer], [place].   ________________________________________________________________________  WHAT IS A BLOOD TRANSFUSION? Blood Transfusion Information  A transfusion is the replacement of blood or some of its parts. Blood is made up of multiple cells which provide different functions.  Red blood cells carry oxygen and are used for blood loss replacement.  White blood cells fight against infection.  Platelets control bleeding.  Plasma helps clot blood.  Other blood products are available for specialized needs, such as hemophilia or other clotting disorders. BEFORE THE TRANSFUSION  Who gives blood for transfusions?   Healthy volunteers who are fully evaluated to make sure their blood is safe. This is blood bank blood. Transfusion therapy is the safest it has ever been in the practice of medicine. Before blood is taken from a donor, a complete history is taken  to make sure that person has no history of diseases nor engages in risky social behavior (examples are intravenous drug use or sexual activity with multiple partners). The donor's travel history is screened to minimize risk of transmitting infections, such as malaria. The donated blood is tested for signs of infectious diseases, such as HIV and hepatitis. The blood is then tested to be sure it is compatible with you in order to minimize the chance of a transfusion reaction. If you or a relative donates blood, this is often done in anticipation of surgery and is not appropriate for emergency situations. It takes many days to process the donated blood. RISKS AND COMPLICATIONS Although transfusion therapy is very safe and saves many lives, the main dangers of transfusion include:   Getting an infectious disease.  Developing a transfusion reaction. This is an allergic reaction to something in the blood you were given. Every precaution is taken to prevent this. The decision to have a blood transfusion has been considered carefully by your caregiver before blood is given. Blood is not given unless the benefits outweigh the risks. AFTER THE TRANSFUSION  Right after receiving a blood transfusion, you will usually feel much better and more energetic. This is especially true if your red blood cells have gotten low (anemic). The transfusion raises the level of the red blood cells which carry oxygen, and this usually causes an energy increase.  The nurse administering the transfusion will monitor you carefully for complications. HOME CARE INSTRUCTIONS  No special instructions are needed after a transfusion. You may find your energy is better. Speak with your caregiver about any limitations on activity for underlying diseases you may have. SEEK MEDICAL CARE IF:   Your condition is not improving after your transfusion.  You develop redness or irritation at the intravenous (IV) site. SEEK IMMEDIATE MEDICAL CARE  IF:  Any of the following symptoms occur over the next 12 hours:  Shaking chills.  You have a temperature by mouth above 102 F (38.9 C), not controlled by medicine.  Chest, back, or muscle pain.  People around you feel you are not acting correctly or are confused.  Shortness of breath or difficulty breathing.  Dizziness and fainting.  You get a rash or develop hives.  You have a decrease in urine output.  Your urine turns a dark color or changes to pink, red, or brown. Any of the following symptoms occur over the next 10 days:  You have a temperature by mouth above 102 F (38.9 C), not controlled by medicine.  Shortness of breath.  Weakness after normal activity.  The white part of the eye turns yellow (jaundice).  You have a decrease in the amount of urine or are urinating less often.  Your urine turns a dark color or changes to pink, red, or brown. Document Released: 03/03/2000 Document Revised: 05/29/2011 Document Reviewed: 10/21/2007 [facility] Patient Information 2014 [place], [place].  _______________________________________________________________________

## 2018-03-01 ENCOUNTER — Other Ambulatory Visit: Payer: Self-pay

## 2018-03-01 ENCOUNTER — Encounter (HOSPITAL_COMMUNITY): Payer: Self-pay

## 2018-03-01 ENCOUNTER — Encounter (HOSPITAL_COMMUNITY)
Admission: RE | Admit: 2018-03-01 | Discharge: 2018-03-01 | Disposition: A | Discharge status: Home / Self Care | Payer: Medicare Other | Source: Ambulatory Visit | Attending: Orthopedic Surgery | Admitting: Orthopedic Surgery

## 2018-03-01 DIAGNOSIS — Z01812 Encounter for preprocedural laboratory examination: Secondary | ICD-10-CM | POA: Diagnosis not present

## 2018-03-01 DIAGNOSIS — M1611 Unilateral primary osteoarthritis, right hip: Secondary | ICD-10-CM | POA: Diagnosis not present

## 2018-03-01 LAB — COMPREHENSIVE METABOLIC PANEL
ALT: 10 U/L (ref 0–44)
AST: 20 U/L (ref 15–41)
Albumin: 4.3 g/dL (ref 3.5–5.0)
Alkaline Phosphatase: 108 U/L (ref 38–126)
Anion gap: 10 (ref 5–15)
BUN: 17 mg/dL (ref 8–23)
CHLORIDE: 104 mmol/L (ref 98–111)
CO2: 27 mmol/L (ref 22–32)
Calcium: 9.1 mg/dL (ref 8.9–10.3)
Creatinine, Ser: 0.96 mg/dL (ref 0.44–1.00)
GFR calc Af Amer: >60 mL/min (ref >60)
GFR calc non Af Amer: 56 mL/min — ABNORMAL LOW (ref >60)
Glucose, Bld: 106 mg/dL — ABNORMAL HIGH (ref 70–99)
Potassium: 4 mmol/L (ref 3.5–5.1)
Sodium: 141 mmol/L (ref 135–145)
Total Bilirubin: 0.6 mg/dL (ref 0.3–1.2)
Total Protein: 7.2 g/dL (ref 6.5–8.1)

## 2018-03-01 LAB — CBC
HCT: 41.6 % (ref 36.0–46.0)
Hemoglobin: 13.2 g/dL (ref 12.0–15.0)
MCH: 28.7 pg (ref 26.0–34.0)
MCHC: 31.7 g/dL (ref 30.0–36.0)
MCV: 90.4 fL (ref 80.0–100.0)
PLATELETS: 331 10*3/uL (ref 150–400)
RBC: 4.6 MIL/uL (ref 3.87–5.11)
RDW: 13 % (ref 11.5–15.5)
WBC: 8.7 10*3/uL (ref 4.0–10.5)
nRBC: 0 % (ref 0.0–0.2)

## 2018-03-01 LAB — PROTIME-INR
INR: 0.94
Prothrombin Time: 12.4 seconds (ref 11.4–15.2)

## 2018-03-01 LAB — SURGICAL PCR SCREEN
MRSA, PCR: NEGATIVE
STAPHYLOCOCCUS AUREUS: NEGATIVE

## 2018-03-01 LAB — APTT: aPTT: 29 seconds (ref 24–36)

## 2018-03-01 NOTE — H&P (Signed)
TOTAL HIP ADMISSION H&P  Patient is admitted for right total hip arthroplasty.  Subjective:  Chief Complaint: right hip pain  HPI: Kaylee Davis, 77 y.o. female, has a history of pain and functional disability in the right hip(s) due to arthritis and patient has failed non-surgical conservative treatments for greater than 12 weeks to include corticosteriod injections, use of assistive devices and activity modification.  Onset of symptoms was abrupt starting several months ago with gradually worsening course since that time.The patient noted no past surgery on the right hip(s).  Patient currently rates pain in the right hip at 10 out of 10 with activity. Patient has worsening of pain with activity and weight bearing and instability. Patient has evidence of severe bone-on-bone arthritis with subchondral cystic changes and flattening of the femoral head by imaging studies. This condition presents safety issues increasing the risk of falls. There is no current active infection.  Patient Active Problem List   Diagnosis Date Noted  . Pelvic pain 07/04/2017  . OA (osteoarthritis) of knee 05/28/2015  . Routine health maintenance 02/03/2011  . Essential hypertension 02/26/2007  . PSVT 02/26/2007  . GERD (gastroesophageal reflux disease) 02/26/2007   Past Medical History:  Diagnosis Date  . Arthritis    OA BOTH KNEES; OCCAS PAIN LEFT SHOULDER - HX OF ROTATOR CUFF PROBLEM  . Dyspepsia   . GERD (gastroesophageal reflux disease)   . GI bleed    GI BLEED FROM DUODENAL ULCER--ABOUT 17 YRS AGO - GIVEN TRANSFUSIONS  . History of blood transfusion    1990's  . History of frequent urinary tract infections   . History of PSVT (paroxysmal supraventricular tachycardia)    can control by doing a valsalva. Triggered by elevated heart rate  . Hypertension   . Imbalance   . Migraine headache    much reduced frequency  . Tachycardia    Episodic  . Tingling    left foot   . TMJ (temporomandibular  joint disorder)    PAST HX GRINDING TEETH --BUT NO LONGER A PROBLEM  . Wears glasses     Past Surgical History:  Procedure Laterality Date  . ABDOMINAL HYSTERECTOMY  1985   TAH/BSO--for ?fibroids  . APPENDECTOMY    . ARTHROPLASTY     left Knee   . cyst removed      from mouth  . KNEE ARTHROSCOPY Left 10/2005  . LAPAROSCOPIC APPENDECTOMY  02/2000  . REPLACEMENT TOTAL KNEE Right   . TONSILLECTOMY    . 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 TOTAL KNEE ARTHROPLASTY Right 05/28/2015   Procedure: RIGHT TOTAL KNEE ARTHROPLASTY;  Surgeon: Ollen Gross, MD;  Location: [facility];  Service: Orthopedics;  Laterality: Right;  . WISDOM TOOTH EXTRACTION  2008    No current facility-administered medications for this encounter.    Current Outpatient Medications  Medication Sig Dispense Refill Last Dose  . amLODipine (NORVASC) 10 MG tablet TAKE 1 BY MOUTH EVERY MORNING (Patient taking differently: Take 10 mg by mouth daily. TAKE 1 BY MOUTH EVERY MORNING) 90 tablet 0 Taking  . esomeprazole (NEXIUM) 20 MG capsule Take 1 capsule (20 mg total) by mouth every morning. 90 capsule 3 Taking  . furosemide (LASIX) 40 MG tablet Take 1 tablet (40 mg total) by mouth daily. 90 tablet 3 Taking  . meloxicam (MOBIC) 15 MG tablet Take 1 tablet (15 mg  total) by mouth daily. 90 tablet 0 Taking  . PREMARIN vaginal cream Place 1 Applicatorful vaginally once a week.   5 Taking   Allergies  Allergen Reactions  . Nitrofuran Derivatives Other (See Comments)    Chest pain and elevated liver enzymes  . Other     PT STATES THE SULFA AND PENICILLIN SHE TOOK MANY YEARS AGO BOTH HAD RED DYES--SO SHE WONDERED IF ALLERGIC TO RED DYES  . Penicillins Hives    Has had penicillin since and has been fine.    . Sulfonamide Derivatives Hives    Social History   Tobacco Use  . Smoking status:  Never Smoker  . Smokeless tobacco: Never Used  Substance Use Topics  . Alcohol use: No    Alcohol/week: 0.0 standard drinks    Comment: no     Family History  Problem Relation Age of Onset  . Cancer Neg Hx        Breast/Colon  . Diabetes Neg Hx   . Heart disease Neg Hx        CAD/MI     Review of Systems  Constitutional: Negative for chills and fever.  HENT: Negative for congestion, sore throat and tinnitus.   Eyes: Negative for double vision, photophobia and pain.  Respiratory: Negative for cough, shortness of breath and wheezing.   Cardiovascular: Negative for chest pain, palpitations and orthopnea.  Gastrointestinal: Negative for heartburn, nausea and vomiting.  Genitourinary: Negative for dysuria, frequency and urgency.  Musculoskeletal: Positive for joint pain.  Neurological: Negative for dizziness, weakness and headaches.    Objective:  Physical Exam  Well nourished and well developed.  General: Alert and oriented x3, cooperative and pleasant, no acute distress.  Head: normocephalic, atraumatic, neck supple.  Eyes: EOMI.  Respiratory: breath sounds clear in all fields, no wheezing, rales, or rhonchi. Cardiovascular: Regular rate and rhythm, no murmurs, gallops or rubs.  Abdomen: non-tender to palpation and soft, normoactive bowel sounds. Musculoskeletal: Right Hip Exam: ROM: Flexion to 100, Internal Rotation 10, External Rotation 20, and Abduction 20 with pain and discomfort. There is popping in the groin with range of motion that radiates to her knee. She was under the impression the pop was coming from her knee, but it is actually coming from her hip. There is no tenderness over the greater trochanter bursa.  Calves soft and nontender. Motor function intact in LE. Strength 5/5 LE bilaterally. Neuro: Distal pulses 2+. Sensation to light touch intact in LE.  Vital signs in last 24 hours: Temp:  [98.4 F (36.9 C)] 98.4 F (36.9 C) (12/13 1406) Pulse Rate:  [73] 73  (12/13 1406) Resp:  [18] 18 (12/13 1406) BP: (124)/(64) 124/64 (12/13 1406) SpO2:  [98 %] 98 % (12/13 1406)  Labs:   Estimated body mass index is 25.69 kg/m as calculated from the following:   Height as of 03/01/18: 5\' 7"  (1.702 m).   Weight as of 01/10/18: 74.4 kg.   Imaging Review Plain radiographs demonstrate severe degenerative joint disease of the right hip(s). The bone quality appears to be adequate for age and reported activity level.    Preoperative templating of the joint replacement has been completed, documented, and submitted to the Operating Room personnel in order to optimize intra-operative equipment management.     Assessment/Plan:  End stage arthritis, right hip(s)  The patient history, physical examination, clinical judgement of the provider and imaging studies are consistent with end stage degenerative joint disease of the right hip(s) and total hip  arthroplasty is deemed medically necessary. The treatment options including medical management, injection therapy, arthroscopy and arthroplasty were discussed at length. The risks and benefits of total hip arthroplasty were presented and reviewed. The risks due to aseptic loosening, infection, stiffness, dislocation/subluxation,  thromboembolic complications and other imponderables were discussed.  The patient acknowledged the explanation, agreed to proceed with the plan and consent was signed. Patient is being admitted for inpatient treatment for surgery, pain control, PT, OT, prophylactic antibiotics, VTE prophylaxis, progressive ambulation and ADL's and discharge planning.The patient is planning to be discharged home.   Therapy Plans: HEP Disposition: Home with husband Planned DVT Prophylaxis: Aspirin 325 mg BID DME needed: None PCP: Jerl Mina TXA: IV Allergies: PCN (hives), sulfa (hives) Anesthesia Concerns: None  - Patient was instructed on what medications to stop prior to surgery. - Follow-up  visit in 2 weeks with Dr. Lequita Halt - Begin physical therapy following surgery - Pre-operative lab work as pre-surgical testing - Prescriptions will be provided in hospital at time of discharge  Arther Abbott, PA-C Orthopedic Surgery [facility] [place]

## 2018-03-04 ENCOUNTER — Other Ambulatory Visit: Payer: Self-pay | Admitting: Internal Medicine

## 2018-03-06 ENCOUNTER — Inpatient Hospital Stay (HOSPITAL_COMMUNITY): Payer: Medicare Other

## 2018-03-06 ENCOUNTER — Inpatient Hospital Stay (HOSPITAL_COMMUNITY)
Admission: RE | Admit: 2018-03-06 | Discharge: 2018-03-07 | DRG: 470 | Disposition: A | Discharge status: Home / Self Care | Payer: Medicare Other | Attending: Orthopedic Surgery | Admitting: Orthopedic Surgery

## 2018-03-06 ENCOUNTER — Inpatient Hospital Stay (HOSPITAL_COMMUNITY): Payer: Medicare Other | Admitting: Certified Registered Nurse Anesthetist

## 2018-03-06 ENCOUNTER — Encounter (HOSPITAL_COMMUNITY): Payer: Self-pay | Admitting: *Deleted

## 2018-03-06 ENCOUNTER — Other Ambulatory Visit: Payer: Self-pay

## 2018-03-06 ENCOUNTER — Encounter (HOSPITAL_COMMUNITY)
Admission: RE | Disposition: A | Discharge status: Home / Self Care | Payer: Self-pay | Source: Home / Self Care | Attending: Orthopedic Surgery

## 2018-03-06 ENCOUNTER — Other Ambulatory Visit: Payer: Self-pay | Admitting: Internal Medicine

## 2018-03-06 DIAGNOSIS — Z8711 Personal history of peptic ulcer disease: Secondary | ICD-10-CM | POA: Diagnosis not present

## 2018-03-06 DIAGNOSIS — Z882 Allergy status to sulfonamides status: Secondary | ICD-10-CM

## 2018-03-06 DIAGNOSIS — Z880 Allergy status to penicillin: Secondary | ICD-10-CM | POA: Diagnosis not present

## 2018-03-06 DIAGNOSIS — K219 Gastro-esophageal reflux disease without esophagitis: Secondary | ICD-10-CM | POA: Diagnosis not present

## 2018-03-06 DIAGNOSIS — Z791 Long term (current) use of non-steroidal anti-inflammatories (NSAID): Secondary | ICD-10-CM | POA: Diagnosis not present

## 2018-03-06 DIAGNOSIS — M1611 Unilateral primary osteoarthritis, right hip: Principal | ICD-10-CM | POA: Diagnosis present

## 2018-03-06 DIAGNOSIS — Z79899 Other long term (current) drug therapy: Secondary | ICD-10-CM | POA: Diagnosis not present

## 2018-03-06 DIAGNOSIS — Z96653 Presence of artificial knee joint, bilateral: Secondary | ICD-10-CM | POA: Diagnosis not present

## 2018-03-06 DIAGNOSIS — Z90710 Acquired absence of both cervix and uterus: Secondary | ICD-10-CM | POA: Diagnosis not present

## 2018-03-06 DIAGNOSIS — Z96641 Presence of right artificial hip joint: Secondary | ICD-10-CM | POA: Diagnosis not present

## 2018-03-06 DIAGNOSIS — I10 Essential (primary) hypertension: Secondary | ICD-10-CM | POA: Diagnosis not present

## 2018-03-06 DIAGNOSIS — Z96649 Presence of unspecified artificial hip joint: Secondary | ICD-10-CM

## 2018-03-06 DIAGNOSIS — M25751 Osteophyte, right hip: Secondary | ICD-10-CM | POA: Diagnosis present

## 2018-03-06 DIAGNOSIS — Z888 Allergy status to other drugs, medicaments and biological substances status: Secondary | ICD-10-CM | POA: Diagnosis not present

## 2018-03-06 DIAGNOSIS — Z419 Encounter for procedure for purposes other than remedying health state, unspecified: Secondary | ICD-10-CM

## 2018-03-06 DIAGNOSIS — Z87440 Personal history of urinary (tract) infections: Secondary | ICD-10-CM | POA: Diagnosis not present

## 2018-03-06 DIAGNOSIS — Z471 Aftercare following joint replacement surgery: Secondary | ICD-10-CM | POA: Diagnosis not present

## 2018-03-06 DIAGNOSIS — M169 Osteoarthritis of hip, unspecified: Secondary | ICD-10-CM | POA: Diagnosis present

## 2018-03-06 HISTORY — PX: TOTAL HIP ARTHROPLASTY: SHX124

## 2018-03-06 LAB — TYPE AND SCREEN
ABO/RH(D): O POS
ANTIBODY SCREEN: NEGATIVE

## 2018-03-06 SURGERY — ARTHROPLASTY, HIP, TOTAL, ANTERIOR APPROACH
Anesthesia: Spinal | Site: Hip | Laterality: Right

## 2018-03-06 MED ORDER — METHOCARBAMOL 500 MG IVPB - SIMPLE MED
500.0000 mg | Freq: Four times a day (QID) | INTRAVENOUS | Status: DC | PRN
  Administered 2018-03-06: 500 mg via INTRAVENOUS
  Filled 2018-03-06: qty 50

## 2018-03-06 MED ORDER — DEXAMETHASONE SODIUM PHOSPHATE 10 MG/ML IJ SOLN
10.0000 mg | Freq: Once | INTRAMUSCULAR | Status: AC
  Administered 2018-03-07: 10 mg via INTRAVENOUS
  Filled 2018-03-06: qty 1

## 2018-03-06 MED ORDER — PROPOFOL 10 MG/ML IV BOLUS
INTRAVENOUS | Status: DC | PRN
  Administered 2018-03-06: 10 mg via INTRAVENOUS

## 2018-03-06 MED ORDER — MENTHOL 3 MG MT LOZG: 1.0000 | LOZENGE | OROMUCOSAL | Status: DC | PRN

## 2018-03-06 MED ORDER — ONDANSETRON HCL 4 MG/2ML IJ SOLN: 4.0000 mg | Freq: Once | INTRAMUSCULAR | Status: DC | PRN

## 2018-03-06 MED ORDER — 0.9 % SODIUM CHLORIDE (POUR BTL) OPTIME
TOPICAL | Status: DC | PRN
  Administered 2018-03-06: 1000 mL

## 2018-03-06 MED ORDER — FUROSEMIDE 40 MG PO TABS
40.0000 mg | ORAL_TABLET | Freq: Every day | ORAL | Status: DC
  Administered 2018-03-07: 40 mg via ORAL
  Filled 2018-03-06: qty 1

## 2018-03-06 MED ORDER — VANCOMYCIN HCL IN DEXTROSE 1-5 GM/200ML-% IV SOLN
1000.0000 mg | Freq: Two times a day (BID) | INTRAVENOUS | Status: AC
  Administered 2018-03-06: 1000 mg via INTRAVENOUS
  Filled 2018-03-06: qty 200

## 2018-03-06 MED ORDER — ONDANSETRON HCL 4 MG/2ML IJ SOLN: 4.0000 mg | Freq: Four times a day (QID) | INTRAMUSCULAR | Status: DC | PRN

## 2018-03-06 MED ORDER — TRANEXAMIC ACID-NACL 1000-0.7 MG/100ML-% IV SOLN
1000.0000 mg | INTRAVENOUS | Status: AC
  Administered 2018-03-06: 1000 mg via INTRAVENOUS
  Filled 2018-03-06: qty 100

## 2018-03-06 MED ORDER — EPHEDRINE 5 MG/ML INJ
INTRAVENOUS | Status: AC
  Filled 2018-03-06: qty 10

## 2018-03-06 MED ORDER — MORPHINE SULFATE (PF) 2 MG/ML IV SOLN: 0.5000 mg | INTRAVENOUS | Status: DC | PRN

## 2018-03-06 MED ORDER — FENTANYL CITRATE (PF) 100 MCG/2ML IJ SOLN
25.0000 ug | INTRAMUSCULAR | Status: DC | PRN
  Administered 2018-03-06 (×3): 50 ug via INTRAVENOUS

## 2018-03-06 MED ORDER — BISACODYL 10 MG RE SUPP: 10.0000 mg | Freq: Every day | RECTAL | Status: DC | PRN

## 2018-03-06 MED ORDER — POLYETHYLENE GLYCOL 3350 17 G PO PACK: 17.0000 g | PACK | Freq: Every day | ORAL | Status: DC | PRN

## 2018-03-06 MED ORDER — METOCLOPRAMIDE HCL 5 MG PO TABS: 5.0000 mg | ORAL_TABLET | Freq: Three times a day (TID) | ORAL | Status: DC | PRN

## 2018-03-06 MED ORDER — DOCUSATE SODIUM 100 MG PO CAPS
100.0000 mg | ORAL_CAPSULE | Freq: Two times a day (BID) | ORAL | Status: DC
  Administered 2018-03-06 – 2018-03-07 (×2): 100 mg via ORAL
  Filled 2018-03-06 (×2): qty 1

## 2018-03-06 MED ORDER — TRANEXAMIC ACID-NACL 1000-0.7 MG/100ML-% IV SOLN
1000.0000 mg | Freq: Once | INTRAVENOUS | Status: AC
  Administered 2018-03-06: 1000 mg via INTRAVENOUS
  Filled 2018-03-06: qty 100

## 2018-03-06 MED ORDER — AMLODIPINE BESYLATE 10 MG PO TABS: ORAL_TABLET | ORAL | 1 refills | Status: DC

## 2018-03-06 MED ORDER — METOCLOPRAMIDE HCL 5 MG/ML IJ SOLN: 5.0000 mg | Freq: Three times a day (TID) | INTRAMUSCULAR | Status: DC | PRN

## 2018-03-06 MED ORDER — LACTATED RINGERS IV SOLN
INTRAVENOUS | Status: DC
  Administered 2018-03-06: 07:00:00 via INTRAVENOUS

## 2018-03-06 MED ORDER — ONDANSETRON HCL 4 MG/2ML IJ SOLN
INTRAMUSCULAR | Status: DC | PRN
  Administered 2018-03-06: 4 mg via INTRAVENOUS

## 2018-03-06 MED ORDER — FENTANYL CITRATE (PF) 100 MCG/2ML IJ SOLN
INTRAMUSCULAR | Status: AC
  Filled 2018-03-06: qty 4

## 2018-03-06 MED ORDER — ACETAMINOPHEN 10 MG/ML IV SOLN
1000.0000 mg | Freq: Four times a day (QID) | INTRAVENOUS | Status: DC
  Administered 2018-03-06: 1000 mg via INTRAVENOUS
  Filled 2018-03-06: qty 100

## 2018-03-06 MED ORDER — DEXAMETHASONE SODIUM PHOSPHATE 10 MG/ML IJ SOLN
8.0000 mg | Freq: Once | INTRAMUSCULAR | Status: AC
  Administered 2018-03-06: 10 mg via INTRAVENOUS

## 2018-03-06 MED ORDER — PHENYLEPHRINE HCL 10 MG/ML IJ SOLN
INTRAMUSCULAR | Status: AC
  Filled 2018-03-06: qty 1

## 2018-03-06 MED ORDER — EPHEDRINE SULFATE-NACL 50-0.9 MG/10ML-% IV SOSY
PREFILLED_SYRINGE | INTRAVENOUS | Status: DC | PRN
  Administered 2018-03-06 (×2): 5 mg via INTRAVENOUS

## 2018-03-06 MED ORDER — TRAMADOL HCL 50 MG PO TABS
50.0000 mg | ORAL_TABLET | Freq: Four times a day (QID) | ORAL | Status: DC | PRN
  Administered 2018-03-06 – 2018-03-07 (×3): 100 mg via ORAL
  Filled 2018-03-06 (×3): qty 2

## 2018-03-06 MED ORDER — SODIUM CHLORIDE 0.9 % IV SOLN
INTRAVENOUS | Status: DC
  Administered 2018-03-06: 12:00:00 via INTRAVENOUS

## 2018-03-06 MED ORDER — PHENOL 1.4 % MT LIQD
1.0000 | OROMUCOSAL | Status: DC | PRN
  Filled 2018-03-06: qty 177

## 2018-03-06 MED ORDER — BUPIVACAINE-EPINEPHRINE (PF) 0.25% -1:200000 IJ SOLN
INTRAMUSCULAR | Status: DC | PRN
  Administered 2018-03-06: 30 mL

## 2018-03-06 MED ORDER — VANCOMYCIN HCL IN DEXTROSE 1-5 GM/200ML-% IV SOLN
1000.0000 mg | INTRAVENOUS | Status: AC
  Administered 2018-03-06: 1000 mg via INTRAVENOUS
  Filled 2018-03-06: qty 200

## 2018-03-06 MED ORDER — DIPHENHYDRAMINE HCL 12.5 MG/5ML PO ELIX: 12.5000 mg | ORAL_SOLUTION | ORAL | Status: DC | PRN

## 2018-03-06 MED ORDER — FLEET ENEMA 7-19 GM/118ML RE ENEM: 1.0000 | ENEMA | Freq: Once | RECTAL | Status: DC | PRN

## 2018-03-06 MED ORDER — AMLODIPINE BESYLATE 10 MG PO TABS
10.0000 mg | ORAL_TABLET | Freq: Every day | ORAL | Status: DC
  Administered 2018-03-07: 10 mg via ORAL
  Filled 2018-03-06: qty 1

## 2018-03-06 MED ORDER — ACETAMINOPHEN 500 MG PO TABS
500.0000 mg | ORAL_TABLET | Freq: Four times a day (QID) | ORAL | Status: DC
  Administered 2018-03-07 (×2): 500 mg via ORAL
  Filled 2018-03-06 (×3): qty 1

## 2018-03-06 MED ORDER — HYDROCODONE-ACETAMINOPHEN 5-325 MG PO TABS
1.0000 | ORAL_TABLET | ORAL | Status: DC | PRN
  Administered 2018-03-06 – 2018-03-07 (×4): 2 via ORAL
  Administered 2018-03-07: 1 via ORAL
  Filled 2018-03-06 (×2): qty 2
  Filled 2018-03-06: qty 1
  Filled 2018-03-06 (×2): qty 2

## 2018-03-06 MED ORDER — ONDANSETRON HCL 4 MG PO TABS
4.0000 mg | ORAL_TABLET | Freq: Four times a day (QID) | ORAL | Status: DC | PRN
  Administered 2018-03-06: 4 mg via ORAL
  Filled 2018-03-06: qty 1

## 2018-03-06 MED ORDER — METHOCARBAMOL 500 MG PO TABS
500.0000 mg | ORAL_TABLET | Freq: Four times a day (QID) | ORAL | Status: DC | PRN
  Administered 2018-03-07 (×3): 500 mg via ORAL
  Filled 2018-03-06 (×4): qty 1

## 2018-03-06 MED ORDER — STERILE WATER FOR IRRIGATION IR SOLN
Status: DC | PRN
  Administered 2018-03-06: 2000 mL

## 2018-03-06 MED ORDER — MELOXICAM 15 MG PO TABS: 15.0000 mg | ORAL_TABLET | Freq: Every day | ORAL | 0 refills | Status: DC

## 2018-03-06 MED ORDER — DEXAMETHASONE SODIUM PHOSPHATE 10 MG/ML IJ SOLN
INTRAMUSCULAR | Status: AC
  Filled 2018-03-06: qty 1

## 2018-03-06 MED ORDER — ASPIRIN EC 325 MG PO TBEC
325.0000 mg | DELAYED_RELEASE_TABLET | Freq: Two times a day (BID) | ORAL | Status: DC
  Administered 2018-03-07: 325 mg via ORAL
  Filled 2018-03-06: qty 1

## 2018-03-06 MED ORDER — ONDANSETRON HCL 4 MG/2ML IJ SOLN
INTRAMUSCULAR | Status: AC
  Filled 2018-03-06: qty 2

## 2018-03-06 MED ORDER — BUPIVACAINE IN DEXTROSE 0.75-8.25 % IT SOLN
INTRATHECAL | Status: DC | PRN
  Administered 2018-03-06: 1.8 mL via INTRATHECAL

## 2018-03-06 MED ORDER — PHENYLEPHRINE HCL 10 MG/ML IJ SOLN
INTRAVENOUS | Status: DC | PRN
  Administered 2018-03-06: 25 ug/min via INTRAVENOUS

## 2018-03-06 MED ORDER — BUPIVACAINE-EPINEPHRINE (PF) 0.25% -1:200000 IJ SOLN
INTRAMUSCULAR | Status: AC
  Filled 2018-03-06: qty 30

## 2018-03-06 MED ORDER — METHOCARBAMOL 500 MG IVPB - SIMPLE MED
INTRAVENOUS | Status: AC
  Filled 2018-03-06: qty 50

## 2018-03-06 MED ORDER — PROPOFOL 10 MG/ML IV BOLUS
INTRAVENOUS | Status: AC
  Filled 2018-03-06: qty 40

## 2018-03-06 MED ORDER — PROPOFOL 500 MG/50ML IV EMUL
INTRAVENOUS | Status: DC | PRN
  Administered 2018-03-06: 75 ug/kg/min via INTRAVENOUS

## 2018-03-06 MED ORDER — CHLORHEXIDINE GLUCONATE 4 % EX LIQD: 60.0000 mL | Freq: Once | CUTANEOUS | Status: DC

## 2018-03-06 SURGICAL SUPPLY — 49 items
BAG DECANTER FOR FLEXI CONT (MISCELLANEOUS) IMPLANT
BAG SPEC THK2 15X12 ZIP CLS (MISCELLANEOUS)
BAG ZIPLOCK 12X15 (MISCELLANEOUS) IMPLANT
BALL HIP ARTICU 28 +5 (Hips) ×1 IMPLANT
BLADE SAG 18X100X1.27 (BLADE) ×3 IMPLANT
BLADE SURG SZ10 CARB STEEL (BLADE) ×6 IMPLANT
CLOSURE WOUND 1/2 X4 (GAUZE/BANDAGES/DRESSINGS) ×1
COVER PERINEAL POST (MISCELLANEOUS) ×3 IMPLANT
COVER SURGICAL LIGHT HANDLE (MISCELLANEOUS) ×3 IMPLANT
COVER WAND RF STERILE (DRAPES) ×3 IMPLANT
CUP ACETBLR 48 OD SECTOR II (Hips) ×3 IMPLANT
DECANTER SPIKE VIAL GLASS SM (MISCELLANEOUS) ×3 IMPLANT
DRAPE STERI IOBAN 125X83 (DRAPES) ×3 IMPLANT
DRAPE U-SHAPE 47X51 STRL (DRAPES) ×6 IMPLANT
DRSG ADAPTIC 3X8 NADH LF (GAUZE/BANDAGES/DRESSINGS) ×3 IMPLANT
DRSG MEPILEX BORDER 4X4 (GAUZE/BANDAGES/DRESSINGS) ×3 IMPLANT
DRSG MEPILEX BORDER 4X8 (GAUZE/BANDAGES/DRESSINGS) ×3 IMPLANT
DURAPREP 26ML APPLICATOR (WOUND CARE) ×3 IMPLANT
ELECT REM PT RETURN 15FT ADLT (MISCELLANEOUS) ×3 IMPLANT
EVACUATOR 1/8 PVC DRAIN (DRAIN) ×3 IMPLANT
GLOVE BIO SURGEON STRL SZ 6.5 (GLOVE) ×4 IMPLANT
GLOVE BIO SURGEON STRL SZ7 (GLOVE) ×6 IMPLANT
GLOVE BIO SURGEON STRL SZ8 (GLOVE) ×3 IMPLANT
GLOVE BIO SURGEONS STRL SZ 6.5 (GLOVE) ×2
GLOVE BIOGEL PI IND STRL 6.5 (GLOVE) ×2 IMPLANT
GLOVE BIOGEL PI IND STRL 7.0 (GLOVE) ×3 IMPLANT
GLOVE BIOGEL PI IND STRL 8 (GLOVE) ×1 IMPLANT
GLOVE BIOGEL PI INDICATOR 6.5 (GLOVE) ×4
GLOVE BIOGEL PI INDICATOR 7.0 (GLOVE) ×6
GLOVE BIOGEL PI INDICATOR 8 (GLOVE) ×2
GOWN STRL REUS W/TWL LRG LVL3 (GOWN DISPOSABLE) ×12 IMPLANT
GOWN STRL REUS W/TWL XL LVL3 (GOWN DISPOSABLE) ×3 IMPLANT
HIP BALL ARTICU 28 +5 (Hips) ×3 IMPLANT
HOLDER FOLEY CATH W/STRAP (MISCELLANEOUS) ×3 IMPLANT
LINER MARATHON 28 48 (Hips) ×3 IMPLANT
MANIFOLD NEPTUNE II (INSTRUMENTS) ×3 IMPLANT
PACK ANTERIOR HIP CUSTOM (KITS) ×3 IMPLANT
STEM FEMORAL SZ5 HIGH ACTIS (Nail) ×3 IMPLANT
STRIP CLOSURE SKIN 1/2X4 (GAUZE/BANDAGES/DRESSINGS) ×2 IMPLANT
SUT ETHIBOND NAB CT1 #1 30IN (SUTURE) ×3 IMPLANT
SUT MNCRL AB 4-0 PS2 18 (SUTURE) ×3 IMPLANT
SUT STRATAFIX 0 PDS 27 VIOLET (SUTURE) ×3
SUT VIC AB 2-0 CT1 27 (SUTURE) ×6
SUT VIC AB 2-0 CT1 TAPERPNT 27 (SUTURE) ×2 IMPLANT
SUTURE STRATFX 0 PDS 27 VIOLET (SUTURE) ×1 IMPLANT
SYR 50ML LL SCALE MARK (SYRINGE) IMPLANT
TRAY FOLEY CATH 14FRSI W/METER (CATHETERS) ×3 IMPLANT
TRAY FOLEY MTR SLVR 16FR STAT (SET/KITS/TRAYS/PACK) IMPLANT
YANKAUER SUCT BULB TIP 10FT TU (MISCELLANEOUS) ×3 IMPLANT

## 2018-03-06 NOTE — Telephone Encounter (Signed)
Copied from CRM [id number]. Topic: Quick Communication - Rx Refill/Question >> Mar 06, 2018 10:38 AM Percival SpanishKennedy, Kaylee Davis: Medication    amLODipine (NORVASC) 10 MG tablet    meloxicam (MOBIC) 15 MG tablet   Has the patient contacted their pharmacy yes    Preferred Pharm[facility]   Agent: Please be advised that RX refills may take up to 3 business days. We ask that you follow-up with your pharmacy.

## 2018-03-06 NOTE — Discharge Instructions (Signed)
°Dr. Frank Aluisio °Total Joint Specialist °[facility] °[address] °[place], [place] [address] °[phone] ° °ANTERIOR APPROACH TOTAL HIP REPLACEMENT POSTOPERATIVE DIRECTIONS ° ° °Hip Rehabilitation, Guidelines Following Surgery  °The results of a hip operation are greatly improved after range of motion and muscle strengthening exercises. Follow all safety measures which are given to protect your hip. If any of these exercises cause increased pain or swelling in your joint, decrease the amount until you are comfortable again. Then slowly increase the exercises. Call your caregiver if you have problems or questions.  ° °HOME CARE INSTRUCTIONS  °• Remove items at home which could result in a fall. This includes throw rugs or furniture in walking pathways.  °· ICE to the affected hip every three hours for 30 minutes at a time and then as needed for pain and swelling.  Continue to use ice on the hip for pain and swelling from surgery. You may notice swelling that will progress down to the foot and ankle.  This is normal after surgery.  Elevate the leg when you are not up walking on it.   °· Continue to use the breathing machine which will help keep your temperature down.  It is common for your temperature to cycle up and down following surgery, especially at night when you are not up moving around and exerting yourself.  The breathing machine keeps your lungs expanded and your temperature down. ° °DIET °You may resume your previous home diet once your are discharged from the hospital. ° °DRESSING / WOUND CARE / SHOWERING °You may shower 3 days after surgery, but keep the wounds dry during showering.  You may use an occlusive plastic wrap (Press'n Seal for example), NO SOAKING/SUBMERGING IN THE BATHTUB.  If the bandage gets wet, change with a clean dry gauze.  If the incision gets wet, pat the wound dry with a clean towel. °You may start showering once you are discharged home but do not submerge the  incision under water. Just pat the incision dry and apply a dry gauze dressing on daily. °Change the surgical dressing daily and reapply a dry dressing each time. ° °ACTIVITY °Walk with your walker as instructed. °Use walker as long as suggested by your caregivers. °Avoid periods of inactivity such as sitting longer than an hour when not asleep. This helps prevent blood clots.  °You may resume a sexual relationship in one month or when given the OK by your doctor.  °You may return to work once you are cleared by your doctor.  °Do not drive a car for 6 weeks or until released by you surgeon.  °Do not drive while taking narcotics. ° °WEIGHT BEARING °Weight bearing as tolerated with assist device (walker, cane, etc) as directed, use it as long as suggested by your surgeon or therapist, typically at least 4-6 weeks. ° °POSTOPERATIVE CONSTIPATION PROTOCOL °Constipation - defined medically as fewer than three stools per week and severe constipation as less than one stool per week. ° °One of the most common issues patients have following surgery is constipation.  Even if you have a regular bowel pattern at home, your normal regimen is likely to be disrupted due to multiple reasons following surgery.  Combination of anesthesia, postoperative narcotics, change in appetite and fluid intake all can affect your bowels.  In order to avoid complications following surgery, here are some recommendations in order to help you during your recovery period. ° °Colace (docusate) - Pick up an over-the-counter form   of Colace or another stool softener and take twice a day as long as you are requiring postoperative pain medications.  Take with a full glass of water daily.  If you experience loose stools or diarrhea, hold the colace until you stool forms back up.  If your symptoms do not get better within 1 week or if they get worse, check with your doctor. ° °Dulcolax (bisacodyl) - Pick up over-the-counter and take as directed by the product  packaging as needed to assist with the movement of your bowels.  Take with a full glass of water.  Use this product as needed if not relieved by Colace only.  ° °MiraLax (polyethylene glycol) - Pick up over-the-counter to have on hand.  MiraLax is a solution that will increase the amount of water in your bowels to assist with bowel movements.  Take as directed and can mix with a glass of water, juice, soda, coffee, or tea.  Take if you go more than two days without a movement. °Do not use MiraLax more than once per day. Call your doctor if you are still constipated or irregular after using this medication for 7 days in a row. ° °If you continue to have problems with postoperative constipation, please contact the office for further assistance and recommendations.  If you experience "the worst abdominal pain ever" or develop nausea or vomiting, please contact the office immediatly for further recommendations for treatment. ° °ITCHING ° If you experience itching with your medications, try taking only a single pain pill, or even half a pain pill at a time.  You can also use Benadryl over the counter for itching or also to help with sleep.  ° °TED HOSE STOCKINGS °Wear the elastic stockings on both legs for three weeks following surgery during the day but you may remove then at night for sleeping. ° °MEDICATIONS °See your medication summary on the “After Visit Summary” that the nursing staff will review with you prior to discharge.  You may have some home medications which will be placed on hold until you complete the course of blood thinner medication.  It is important for you to complete the blood thinner medication as prescribed by your surgeon.  Continue your approved medications as instructed at time of discharge. ° °PRECAUTIONS °If you experience chest pain or shortness of breath - call 911 immediately for transfer to the hospital emergency department.  °If you develop a fever greater that 101 F, purulent drainage  from wound, increased redness or drainage from wound, foul odor from the wound/dressing, or calf pain - CONTACT YOUR SURGEON.   °                                                °FOLLOW-UP APPOINTMENTS °Make sure you keep all of your appointments after your operation with your surgeon and caregivers. You should call the office at the above phone number and make an appointment for approximately two weeks after the date of your surgery or on the date instructed by your surgeon outlined in the "After Visit Summary". ° °RANGE OF MOTION AND STRENGTHENING EXERCISES  °These exercises are designed to help you keep full movement of your hip joint. Follow your caregiver's or physical therapist's instructions. Perform all exercises about fifteen times, three times per day or as directed. Exercise both hips, even if you have   had only one joint replacement. These exercises can be done on a training (exercise) mat, on the floor, on a table or on a bed. Use whatever works the best and is most comfortable for you. Use music or television while you are exercising so that the exercises are a pleasant break in your day. This will make your life better with the exercises acting as a break in routine you can look forward to.  °• Lying on your back, slowly slide your foot toward your buttocks, raising your knee up off the floor. Then slowly slide your foot back down until your leg is straight again.  °• Lying on your back spread your legs as far apart as you can without causing discomfort.  °• Lying on your side, raise your upper leg and foot straight up from the floor as far as is comfortable. Slowly lower the leg and repeat.  °• Lying on your back, tighten up the muscle in the front of your thigh (quadriceps muscles). You can do this by keeping your leg straight and trying to raise your heel off the floor. This helps strengthen the largest muscle supporting your knee.  °• Lying on your back, tighten up the muscles of your buttocks both  with the legs straight and with the knee bent at a comfortable angle while keeping your heel on the floor.  ° °IF YOU ARE TRANSFERRED TO A SKILLED REHAB FACILITY °If the patient is transferred to a skilled rehab facility following release from the hospital, a list of the current medications will be sent to the facility for the patient to continue.  When discharged from the skilled rehab facility, please have the facility set up the patient's Home Health Physical Therapy prior to being released. Also, the skilled facility will be responsible for providing the patient with their medications at time of release from the facility to include their pain medication, the muscle relaxants, and their blood thinner medication. If the patient is still at the rehab facility at time of the two week follow up appointment, the skilled rehab facility will also need to assist the patient in arranging follow up appointment in our office and any transportation needs. ° °MAKE SURE YOU:  °• Understand these instructions.  °• Get help right away if you are not doing well or get worse.  ° ° °Pick up stool softner and laxative for home use following surgery while on pain medications. °Do not submerge incision under water. °Please use good hand washing techniques while changing dressing each day. °May shower starting three days after surgery. °Please use a clean towel to pat the incision dry following showers. °Continue to use ice for pain and swelling after surgery. °Do not use any lotions or creams on the incision until instructed by your surgeon. ° °

## 2018-03-06 NOTE — Anesthesia Postprocedure Evaluation (Signed)
Anesthesia Post Note  Patient: Clearnce HastenBrenda B Mcmanus  Procedure(s) Performed: TOTAL HIP ARTHROPLASTY ANTERIOR APPROACH (Right Hip)     Patient location during evaluation: PACU Anesthesia Type: Spinal Level of consciousness: oriented, awake and alert and awake Pain management: pain level controlled Vital Signs Assessment: post-procedure vital signs reviewed and stable Respiratory status: spontaneous breathing, respiratory function stable and nonlabored ventilation Cardiovascular status: blood pressure returned to baseline and stable Postop Assessment: no headache, no backache, no apparent nausea or vomiting, spinal receding and patient able to bend at knees Anesthetic complications: no    Last Vitals:  Vitals:   03/06/18 1320 03/06/18 1442  BP: 117/65 (!) 113/56  Pulse: 65 67  Resp: 15 16  Temp: (!) 35.8 C 36.4 C  SpO2: 100% 100%    Last Pain:  Vitals:   03/06/18 1601  TempSrc:   PainSc: 8                  Cecile HearingStephen Edward Shauntelle Jamerson

## 2018-03-06 NOTE — Anesthesia Preprocedure Evaluation (Signed)
Anesthesia Evaluation  Patient identified by MRN, date of birth, ID band Patient awake    Reviewed: Allergy & Precautions, NPO status , Patient's Chart, lab work & pertinent test results  Airway Mallampati: II  TM Distance: >3 FB Neck ROM: Full    Dental  (+) Teeth Intact, Dental Advisory Given   Pulmonary neg pulmonary ROS,    Pulmonary exam normal breath sounds clear to auscultation       Cardiovascular hypertension, Pt. on medications Normal cardiovascular exam+ dysrhythmias (H/o PSVT)  Rhythm:Regular Rate:Normal     Neuro/Psych  Headaches, Left foot paresthesias     GI/Hepatic Neg liver ROS, GERD  Medicated,  Endo/Other  negative endocrine ROS  Renal/GU negative Renal ROS     Musculoskeletal  (+) Arthritis , Osteoarthritis,  right hip osteoarthritis   Abdominal   Peds  Hematology negative hematology ROS (+) Plt 331k on 03/01/18   Anesthesia Other Findings Day of surgery medications reviewed with the patient.  Reproductive/Obstetrics                             Anesthesia Physical Anesthesia Plan  ASA: II  Anesthesia Plan: Spinal   Post-op Pain Management:    Induction:   PONV Risk Score and Plan: 2 and Propofol infusion and Ondansetron  Airway Management Planned: Natural Airway and Nasal Cannula  Additional Equipment:   Intra-op Plan:   Post-operative Plan:   Informed Consent: I have reviewed the patients History and Physical, chart, labs and discussed the procedure including the risks, benefits and alternatives for the proposed anesthesia with the patient or authorized representative who has indicated his/her understanding and acceptance.   Dental advisory given  Plan Discussed with: CRNA, Anesthesiologist and Surgeon  Anesthesia Plan Comments:         Anesthesia Quick Evaluation

## 2018-03-06 NOTE — Evaluation (Signed)
Physical Therapy Evaluation Patient Details Name: Kaylee Davis MRN: [id number] DOB: 1940/11/15 Today's Date: 03/06/2018   History of Present Illness  77 yo female s/p R DA-THA on 03/06/18. PMH includes OA, HTN, GERD, UTI, PSVT, migraines, L foot paresthesias, TMJ dysfunction, L TKR, R TKR.   Clinical Impression   Pt presents with R hip pain, difficulty performing bed mobility due to pain, decreased activity tolerance, and dizziness/nausea with standing. Pt to benefit from acute PT to address deficits. Pt performed sit to stand with min guard, unable to ambulate this session due to dizziness and nausea. BP and HR WNL. Pt educated on quad sets (5-10/hour), ankle pumps (20/hour), and heel slides (5-10/hour) to perform this afternoon/evening to lessen stiffness and increase circulation, to pt's tolerance and limited by pain. PT to progress mobility as tolerated, and will continue to follow acutely.      Follow Up Recommendations Follow surgeon's recommendation for DC plan and follow-up therapies;Supervision for mobility/OOB(HEP )    Equipment Recommendations  None recommended by PT    Recommendations for Other Services       Precautions / Restrictions Precautions Precautions: Fall Restrictions Weight Bearing Restrictions: No Other Position/Activity Restrictions: WBAT       Mobility  Bed Mobility Overal bed mobility: Needs Assistance Bed Mobility: Supine to Sit     Supine to sit: Min assist;HOB elevated     General bed mobility comments: Min assist for RLE lifting and translation to EOB. Verbal cuing for sequencing provided throughout.   Transfers Overall transfer level: Needs assistance Equipment used: Rolling walker (2 wheeled) Transfers: Sit to/from Stand Sit to Stand: Min guard;From elevated surface         General transfer comment: Min guard for safety. Verbal cuing for hand placement. Upon standing, pt reporting feeling dizzy and nauseous. Pt returned to  supine position, BP and HR 127/68 and 63 bpm respectively.   Ambulation/Gait Ambulation/Gait assistance: (NT - pt with dizziness and nausea upon standing. )              Stairs            Wheelchair Mobility    Modified Rankin (Stroke Patients Only)       Balance Overall balance assessment: Mild deficits observed, not formally tested                                           Pertinent Vitals/Pain Pain Assessment: 0-10 Pain Score: 4  Pain Location: R hip  Pain Descriptors / Indicators: Sore Pain Intervention(s): Limited activity within patient's tolerance;Repositioned;Ice applied;Monitored during session    Home Living Family/patient expects to be discharged to:: Private residence Living Arrangements: Spouse/significant other Available Help at Discharge: Family;Available PRN/intermittently Type of Home: House Home Access: Stairs to enter Entrance Stairs-Rails: None Entrance Stairs-Number of Steps: 1 Home Layout: Two level;Bed/bath upstairs Home Equipment: Walker - 2 wheels;Crutches;Cane - single point;Other (comment) Additional Comments: Pt has one toilet with rails for standing from toilet     Prior Function Level of Independence: Independent with assistive device(s)         Comments: pt using RW prior to surgery because she was in so much hip pain      Hand Dominance   Dominant Hand: Right    Extremity/Trunk Assessment   Upper Extremity Assessment Upper Extremity Assessment: Overall WFL for tasks assessed    Lower Extremity  Assessment Lower Extremity Assessment: Overall WFL for tasks assessed;RLE deficits/detail RLE Deficits / Details: suspected post-surgical hip weakness; able to perform ankle pumps, quad sets, assisted heel slides, SLR with lift assist  RLE Sensation: WNL    Cervical / Trunk Assessment Cervical / Trunk Assessment: Normal  Communication   Communication: No difficulties  Cognition Arousal/Alertness:  Awake/alert Behavior During Therapy: WFL for tasks assessed/performed;Anxious Overall Cognitive Status: Within Functional Limits for tasks assessed                                 General Comments: Pt stating "I am shakey, I am nervous" multiple times during eval       General Comments      Exercises Total Joint Exercises Ankle Circles/Pumps: AROM;Both;5 reps;Supine   Assessment/Plan    PT Assessment Patient needs continued PT services  PT Problem List Decreased strength;Pain;Decreased activity tolerance;Decreased knowledge of use of DME;Decreased balance;Decreased safety awareness;Decreased mobility       PT Treatment Interventions DME instruction;Therapeutic activities;Gait training;Therapeutic exercise;Patient/family education;Stair training;Balance training;Functional mobility training    PT Goals (Current goals can be found in the Care Plan section)  Acute Rehab PT Goals Patient Stated Goal: none stated  PT Goal Formulation: With patient Time For Goal Achievement: 03/13/18 Potential to Achieve Goals: Good    Frequency 7X/week   Barriers to discharge        Co-evaluation               AM-PAC PT "6 Clicks" Mobility  Outcome Measure Help needed turning from your back to your side while in a flat bed without using bedrails?: A Little Help needed moving from lying on your back to sitting on the side of a flat bed without using bedrails?: A Little Help needed moving to and from a bed to a chair (including a wheelchair)?: A Little Help needed standing up from a chair using your arms (e.g., wheelchair or bedside chair)?: A Little Help needed to walk in hospital room?: A Little Help needed climbing 3-5 steps with a railing? : A Lot 6 Click Score: 17    End of Session Equipment Utilized During Treatment: Gait belt Activity Tolerance: Treatment limited secondary to medical complications (Comment)(Pt limited by nausea and dizziness) Patient left: in  bed;with bed alarm set;with call bell/phone within reach;with family/visitor present;with SCD's reapplied Nurse Communication: Mobility status PT Visit Diagnosis: Other abnormalities of gait and mobility (R26.89);Difficulty in walking, not elsewhere classified (R26.2)    Time: 1530-1550 PT Time Calculation (min) (ACUTE ONLY): 20 min   Charges:   PT Evaluation $PT Eval Low Complexity: 1 Low         Nicola PoliceAlexa D Dennice Tindol, PT Acute Rehabilitation Services Pager [phone]  Office [phone]  Tyrae Alcoser D Despina Hiddenure 03/06/2018, 6:53 PM

## 2018-03-06 NOTE — Interval H&P Note (Signed)
History and Physical Interval Note:  03/06/2018 8:12 AM  Kaylee Davis  has presented today for surgery, with the diagnosis of right hip osteoarthritis  The various methods of treatment have been discussed with the patient and family. After consideration of risks, benefits and other options for treatment, the patient has consented to  Procedure(s) with comments: TOTAL HIP ARTHROPLASTY ANTERIOR APPROACH (Right) - 100min as a surgical intervention .  The patient's history has been reviewed, patient examined, no change in status, stable for surgery.  I have reviewed the patient's chart and labs.  Questions were answered to the patient's satisfaction.     Homero FellersFrank Cashmere Harmes

## 2018-03-06 NOTE — Anesthesia Procedure Notes (Signed)
Spinal  Patient location during procedure: OR Start time: 03/06/2018 8:32 AM End time: 03/06/2018 8:40 AM Staffing Anesthesiologist: Achille RichHodierne, Carine Nordgren, MD Performed: anesthesiologist  Preanesthetic Checklist Completed: patient identified, surgical consent, pre-op evaluation, timeout performed, IV checked, risks and benefits discussed and monitors and equipment checked Spinal Block Patient position: sitting Prep: DuraPrep Patient monitoring: cardiac monitor, continuous pulse ox and blood pressure Approach: left paramedian Location: L3-4 Injection technique: single-shot Needle Needle type: Spinocan  Needle gauge: 22 G Needle length: 9 cm Assessment Sensory level: T10 Additional Notes Functioning IV was confirmed and monitors were applied. Sterile prep and drape, including hand hygiene and sterile gloves were used. The patient was positioned and the spine was prepped. The skin was anesthetized with lidocaine.  Free flow of clear CSF was obtained prior to injecting local anesthetic into the CSF.  The spinal needle aspirated freely following injection.  The needle was carefully withdrawn.  The patient tolerated the procedure well.

## 2018-03-06 NOTE — Op Note (Signed)
OPERATIVE REPORT- TOTAL HIP ARTHROPLASTY   PREOPERATIVE DIAGNOSIS: Osteoarthritis of the Right hip.   POSTOPERATIVE DIAGNOSIS: Osteoarthritis of the Right  hip.   PROCEDURE: Right total hip arthroplasty, anterior approach.   SURGEON: Ollen GrossFrank Nil Bolser, MD   ASSISTANT: Dennie BibleAshley Stinson, PA-C  ANESTHESIA:  Spinal  ESTIMATED BLOOD LOSS:-350 mL    DRAINS: Hemovac x1.   COMPLICATIONS: None   CONDITION: PACU - hemodynamically stable.   BRIEF CLINICAL NOTE: [name] is a 77 y.o. female who has advanced end-  stage arthritis of their Right  hip with progressively worsening pain and  dysfunction.The patient has failed nonoperative management and presents for  total hip arthroplasty.   PROCEDURE IN DETAIL: After successful administration of spinal  anesthetic, the traction boots [facility] the [facility] were placed on both  feet and the patient was [facility] the [facility], boots placed into the leg  holders. The Right hip was then isolated from the perineum with plastic  drapes and prepped and draped in the usual sterile fashion. ASIS and  greater trochanter were marked and a oblique incision was made, starting  at about 1 cm lateral and 2 cm distal to the ASIS and coursing towards  the anterior cortex of the femur. The skin was cut with a 10 blade  through subcutaneous tissue to the level of the fascia overlying the  tensor fascia lata muscle. The fascia was then incised in line with the  incision at the junction of the anterior third and posterior 2/3rd. The  muscle was teased off the fascia and then the interval between the TFL  and the rectus was developed. The Hohmann retractor was then placed at  the top of the femoral neck over the capsule. The vessels overlying the  capsule were cauterized and the fat on top of the capsule was removed.  A Hohmann retractor was then placed anterior underneath the rectus  femoris to give exposure to the entire anterior capsule. A T-shaped   capsulotomy was performed. The edges were tagged and the femoral head  was identified.       Osteophytes are removed off the superior acetabulum.  The femoral neck was then cut in situ with an oscillating saw. Traction  was then applied to the left lower extremity utilizing the [facility]  traction. The femoral head was then removed. Retractors were placed  around the acetabulum and then circumferential removal of the labrum was  performed. Osteophytes were also removed. Reaming starts at 45 mm to  medialize and  Increased in 2 mm increments to 47 mm. We reamed in  approximately 40 degrees of abduction, 20 degrees anteversion. A 48 mm  pinnacle acetabular shell was then impacted in anatomic position under  fluoroscopic guidance with excellent purchase. We did not need to place  any additional dome screws. A 28 mm neutral + 4 marathon liner was then  placed into the acetabular shell.       The femoral lift was then placed along the lateral aspect of the femur  just distal to the vastus ridge. The leg was  externally rotated and capsule  was stripped off the inferior aspect of the femoral neck down to the  level of the lesser trochanter, this was done with electrocautery. The femur was lifted after this was performed. The  leg was then placed in an extended and adducted position essentially delivering the femur. We also removed the capsule superiorly and the piriformis from the piriformis  fossa to gain excellent exposure of the  proximal femur. Rongeur was used to remove some cancellous bone to get  into the lateral portion of the proximal femur for placement of the  initial starter reamer. The starter broaches was placed  the starter broach  and was shown to go down the center of the canal. Broaching  with the Actis system was then performed starting at size 0  coursing  Up to size 5. A size 5 had excellent torsional and rotational  and axial stability. The trial high offset neck was then placed   with a 28 + 5 trial head. The hip was then reduced. We confirmed that  the stem was in the canal both on AP and lateral x-rays. It also has excellent sizing. The hip was reduced with outstanding stability through full extension and full external rotation.. AP pelvis was taken and the leg lengths were measured and found to be equal. Hip was then dislocated again and the femoral head and neck removed. The  femoral broach was removed. Size 5 Actis stem with a high offset  neck was then impacted into the femur following native anteversion. Has  excellent purchase in the canal. Excellent torsional and rotational and  axial stability. It is confirmed to be in the canal on AP and lateral  fluoroscopic views. The 28 + 5 metal head was placed and the hip  reduced with outstanding stability. Again AP pelvis was taken and it  confirmed that the leg lengths were equal. The wound was then copiously  irrigated with saline solution and the capsule reattached and repaired  with Ethibond suture. 30 ml of .25% Bupivicaine was  injected into the capsule and into the edge of the tensor fascia lata as well as subcutaneous tissue. The fascia overlying the tensor fascia lata was then closed with a running #1 V-Loc. Subcu was closed with interrupted 2-0 Vicryl and subcuticular running 4-0 Monocryl. Incision was cleaned  and dried. Steri-Strips and a bulky sterile dressing applied. Hemovac  drain was hooked to suction and then the patient was awakened and transported to  recovery in stable condition.        Please note that a surgical assistant was a medical necessity for this procedure to perform it in a safe and expeditious manner. Assistant was necessary to provide appropriate retraction of vital neurovascular structures and to prevent femoral fracture and allow for anatomic placement of the prosthesis.  Ollen Gross, M.D.

## 2018-03-06 NOTE — Transfer of Care (Signed)
Immediate Anesthesia Transfer of Care Note  Patient: Kaylee Davis  Procedure(s) Performed: TOTAL HIP ARTHROPLASTY ANTERIOR APPROACH (Right Hip)  Patient Location: PACU  Anesthesia Type:Spinal  Level of Consciousness: awake, alert  and oriented  Airway & Oxygen Therapy: Patient Spontanous Breathing and Patient connected to face mask oxygen  Post-op Assessment: Report given to RN and Post -op Vital signs reviewed and stable  Post vital signs: Reviewed and stable  Last Vitals:  Vitals Value Taken Time  BP 113/56 03/06/2018 10:12 AM  Temp    Pulse 63 03/06/2018 10:15 AM  Resp 14 03/06/2018 10:15 AM  SpO2 100 % 03/06/2018 10:15 AM  Vitals shown include unvalidated device data.  Last Pain:  Vitals:   03/06/18 0638  TempSrc: Oral  PainSc:       Patients Stated Pain Goal: 6 (03/06/18 [id number])  Complications: No apparent anesthesia complications

## 2018-03-07 ENCOUNTER — Encounter (HOSPITAL_COMMUNITY): Payer: Self-pay | Admitting: Orthopedic Surgery

## 2018-03-07 LAB — BASIC METABOLIC PANEL
Anion gap: 9 (ref 5–15)
BUN: 9 mg/dL (ref 8–23)
CO2: 25 mmol/L (ref 22–32)
Calcium: 8.8 mg/dL — ABNORMAL LOW (ref 8.9–10.3)
Chloride: 105 mmol/L (ref 98–111)
Creatinine, Ser: 0.6 mg/dL (ref 0.44–1.00)
GFR calc Af Amer: >60 mL/min (ref >60)
GFR calc non Af Amer: >60 mL/min (ref >60)
Glucose, Bld: 122 mg/dL — ABNORMAL HIGH (ref 70–99)
POTASSIUM: 3.7 mmol/L (ref 3.5–5.1)
Sodium: 139 mmol/L (ref 135–145)

## 2018-03-07 LAB — CBC
HCT: 32.3 % — ABNORMAL LOW (ref 36.0–46.0)
Hemoglobin: 10.2 g/dL — ABNORMAL LOW (ref 12.0–15.0)
MCH: 28.7 pg (ref 26.0–34.0)
MCHC: 31.6 g/dL (ref 30.0–36.0)
MCV: 90.7 fL (ref 80.0–100.0)
Platelets: 254 10*3/uL (ref 150–400)
RBC: 3.56 MIL/uL — ABNORMAL LOW (ref 3.87–5.11)
RDW: 12.6 % (ref 11.5–15.5)
WBC: 16.5 10*3/uL — ABNORMAL HIGH (ref 4.0–10.5)
nRBC: 0 % (ref 0.0–0.2)

## 2018-03-07 MED ORDER — HYDROCODONE-ACETAMINOPHEN 5-325 MG PO TABS: 1.0000 | ORAL_TABLET | Freq: Four times a day (QID) | ORAL | 0 refills | Status: DC | PRN

## 2018-03-07 MED ORDER — METHOCARBAMOL 500 MG PO TABS: 500.0000 mg | ORAL_TABLET | Freq: Four times a day (QID) | ORAL | 0 refills | Status: DC | PRN

## 2018-03-07 MED ORDER — ASPIRIN 325 MG PO TBEC: 325.0000 mg | DELAYED_RELEASE_TABLET | Freq: Two times a day (BID) | ORAL | 0 refills | Status: AC

## 2018-03-07 MED ORDER — TRAMADOL HCL 50 MG PO TABS: 50.0000 mg | ORAL_TABLET | Freq: Four times a day (QID) | ORAL | 0 refills | Status: DC | PRN

## 2018-03-07 NOTE — Progress Notes (Signed)
Physical Therapy Treatment Patient Details Name: Kaylee HastenBrenda B Rivere MRN: [id number] DOB: [birth year] Today's Date: 03/07/2018    History of Present Illness 77 yo female s/p R DA-THA on 03/06/18. PMH includes OA, HTN, GERD, UTI, PSVT, migraines, L foot paresthesias, TMJ dysfunction, L TKR, R TKR.     PT Comments    POD # 1 am session Assisted OOB to amb to bathroom then in hallway.  Demonstrated and instructed pt how to use belt to self assist LE as a leg lifterGeneral Gait Details: 25% VC 's on proper walker to self distance and safety with turns   Also assisted with toiler transfer.  Returned to room and Performed some TE's following HEP handout.  Instructed on proper tech, freq as well as use of ICE.   Pt will need another PT session to address stairs.   Follow Up Recommendations  Follow surgeon's recommendation for DC plan and follow-up therapies;Supervision for mobility/OOB(HEP)     Equipment Recommendations  None recommended by PT    Recommendations for Other Services       Precautions / Restrictions Precautions Precautions: Fall Restrictions Weight Bearing Restrictions: No Other Position/Activity Restrictions: WBAT     Mobility  Bed Mobility Overal bed mobility: Needs Assistance Bed Mobility: Supine to Sit     Supine to sit: Min guard     General bed mobility comments: demonstarted and instructed how to use a belt to self assist R LE  Transfers Overall transfer level: Needs assistance Equipment used: Rolling walker (2 wheeled) Transfers: Sit to/from Stand Sit to Stand: Min guard;From elevated surface         General transfer comment: 25% VC's on proper hand placment and increased tiem.  Assisted with toilet transfer as well  Ambulation/Gait Ambulation/Gait assistance: Supervision;Min guard Gait Distance (Feet): 45 Feet Assistive device: Rolling walker (2 wheeled) Gait Pattern/deviations: Step-to pattern;Step-through pattern;Decreased stance time -  right Gait velocity: decreased    General Gait Details: 25% VC 's on proper walker to self distance and safety with turns   Also assisted with toiler transfer.     Stairs             Wheelchair Mobility    Modified Rankin (Stroke Patients Only)       Balance     Total Hip Replacement TE's 10 reps ankle pumps 10 reps knee presses 10 reps heel slides 10 reps SAQ's 10 reps ABD Followed by ICE                                         Cognition Arousal/Alertness: Awake/alert Behavior During Therapy: WFL for tasks assessed/performed Overall Cognitive Status: Within Functional Limits for tasks assessed                                        Exercises      General Comments        Pertinent Vitals/Pain Pain Assessment: 0-10 Pain Score: 5  Pain Location: R hip  Pain Descriptors / Indicators: Sore;Operative site guarding;Tender Pain Intervention(s): Monitored during session;Premedicated before session;Ice applied    Home Living                      Prior Function            PT  Goals (current goals can now be found in the care plan section) Progress towards PT goals: Progressing toward goals    Frequency    7X/week      PT Plan Current plan remains appropriate    Co-evaluation              AM-PAC PT "6 Clicks" Mobility   Outcome Measure  Help needed turning from your back to your side while in a flat bed without using bedrails?: A Little Help needed moving from lying on your back to sitting on the side of a flat bed without using bedrails?: A Little Help needed moving to and from a bed to a chair (including a wheelchair)?: A Little Help needed standing up from a chair using your arms (e.g., wheelchair or bedside chair)?: A Little Help needed to walk in hospital room?: A Little Help needed climbing 3-5 steps with a railing? : A Lot 6 Click Score: 17    End of Session Equipment Utilized During  Treatment: Gait belt Activity Tolerance: Patient tolerated treatment well Patient left: in chair;with call bell/phone within reach Nurse Communication: Mobility status PT Visit Diagnosis: Other abnormalities of gait and mobility (R26.89);Difficulty in walking, not elsewhere classified (R26.2)     Time: 1020-1050 PT Time Calculation (min) (ACUTE ONLY): 30 min  Charges:  $Gait Training: 8-22 mins $Therapeutic Exercise: 8-22 mins                     Felecia ShellingLori Cayman Brogden  PTA Acute  Rehabilitation Services Pager      [phone] Office      [phone]

## 2018-03-07 NOTE — Progress Notes (Signed)
Physical Therapy Treatment Patient Details Name: Kaylee HastenBrenda B Davis MRN: [id number] DOB: 1940/08/05 Today's Date: 03/07/2018    History of Present Illness 77 yo female s/p R DA-THA on 03/06/18. PMH includes OA, HTN, GERD, UTI, PSVT, migraines, L foot paresthesias, TMJ dysfunction, L TKR, R TKR.     PT Comments    POD # 1 pm session Spouse and son present for 'hands on" instruction.  Assisted with amb, transfers and stairs. Addressed all mobility questions. Pt ready for D/C to home.    Follow Up Recommendations  Follow surgeon's recommendation for DC plan and follow-up therapies;Supervision for mobility/OOB(HEP)     Equipment Recommendations  None recommended by PT    Recommendations for Other Services       Precautions / Restrictions Precautions Precautions: Fall Restrictions Weight Bearing Restrictions: No Other Position/Activity Restrictions: WBAT     Mobility  Bed Mobility Overal bed mobility: Needs Assistance Bed Mobility: Supine to Sit     Supine to sit: Min guard     General bed mobility comments: demonstarted and instructed how to use a belt to self assist R LE  Transfers Overall transfer level: Needs assistance Equipment used: Rolling walker (2 wheeled) Transfers: Sit to/from Stand Sit to Stand: Min guard;From elevated surface         General transfer comment: 25% VC's on proper hand placment and increased tiem.  Assisted with toilet transfer as well  Ambulation/Gait Ambulation/Gait assistance: Supervision;Min guard Gait Distance (Feet): 73 Feet Assistive device: Rolling walker (2 wheeled) Gait Pattern/deviations: Step-to pattern;Step-through pattern;Decreased stance time - right Gait velocity: decreased    General Gait Details: 25% VC 's on proper walker to self distance and safety with turns   Also assisted with toiler transfer.     Stairs Stairs: Yes Stairs assistance: Min guard;Min assist Stair Management: One rail Left;Step to  pattern;Forwards Number of Stairs: 3 General stair comments: with spouse "hands on" assisted with 25% VC's on safe handling and proper sequencing   Wheelchair Mobility    Modified Rankin (Stroke Patients Only)       Balance                                            Cognition Arousal/Alertness: Awake/alert Behavior During Therapy: WFL for tasks assessed/performed Overall Cognitive Status: Within Functional Limits for tasks assessed                                        Exercises      General Comments        Pertinent Vitals/Pain Pain Assessment: 0-10 Pain Score: 5  Pain Location: R hip  Pain Descriptors / Indicators: Sore;Operative site guarding;Tender Pain Intervention(s): Monitored during session;Premedicated before session;Ice applied    Home Living                      Prior Function            PT Goals (current goals can now be found in the care plan section) Progress towards PT goals: Progressing toward goals    Frequency    7X/week      PT Plan Current plan remains appropriate    Co-evaluation              AM-PAC PT "  6 Clicks" Mobility   Outcome Measure  Help needed turning from your back to your side while in a flat bed without using bedrails?: A Little Help needed moving from lying on your back to sitting on the side of a flat bed without using bedrails?: A Little Help needed moving to and from a bed to a chair (including a wheelchair)?: A Little Help needed standing up from a chair using your arms (e.g., wheelchair or bedside chair)?: A Little Help needed to walk in hospital room?: A Little Help needed climbing 3-5 steps with a railing? : A Lot 6 Click Score: 17    End of Session Equipment Utilized During Treatment: Gait belt Activity Tolerance: Patient tolerated treatment well Patient left: in chair;with call bell/phone within reach Nurse Communication: Mobility status PT Visit  Diagnosis: Other abnormalities of gait and mobility (R26.89);Difficulty in walking, not elsewhere classified (R26.2)     Time: 1415-1440 PT Time Calculation (min) (ACUTE ONLY): 25 min  Charges:  $Gait Training: 8-22 mins $Therapeutic Activity: 8-22 mins                     {  Felecia ShellingLori Devera Englander  PTA Acute  Rehabilitation Services Pager      [phone] Office      [phone]

## 2018-03-07 NOTE — Plan of Care (Signed)
Patient is discharge home. Discharge instructions went over with patient and her husband. IV dc'd, Rx given

## 2018-03-07 NOTE — Progress Notes (Signed)
   Subjective: 1 Day Post-Op Procedure(s) (LRB): TOTAL HIP ARTHROPLASTY ANTERIOR APPROACH (Right) Patient reports pain as mild.   Patient seen in rounds by Dr. Lequita HaltAluisio. Patient is well, and has had no acute complaints or problems. States she is ready to go home. Denies chest pain or SOB. Foley catheter removed this AM.  We will continue therapy today.   Objective: Vital signs in last 24 hours: Temp:  [96.4 F (35.8 C)-98.7 F (37.1 C)] 97.9 F (36.6 C) (12/19 0553) Pulse Rate:  [59-67] 62 (12/19 0553) Resp:  [12-20] 17 (12/19 0553) BP: (106-134)/(50-67) 127/63 (12/19 0553) SpO2:  [95 %-100 %] 98 % (12/19 0553)  Intake/Output from previous day:  Intake/Output Summary (Last 24 hours) at 03/07/2018 0750 Last data filed at 03/07/2018 0600 Gross per 24 hour  Intake 3006.55 ml  Output 2605 ml  Net 401.55 ml    Labs: Recent Labs    03/07/18 0525  HGB 10.2*   Recent Labs    03/07/18 0525  WBC 16.5*  RBC 3.56*  HCT 32.3*  PLT 254   Recent Labs    03/07/18 0525  NA 139  K 3.7  CL 105  CO2 25  BUN 9  CREATININE 0.60  GLUCOSE 122*  CALCIUM 8.8*   Exam: General - Patient is Alert and Oriented Extremity - Neurologically intact Neurovascular intact Sensation intact distally Dorsiflexion/Plantar flexion intact Dressing - dressing C/D/I Motor Function - intact, moving foot and toes well on exam.   Past Medical History:  Diagnosis Date  . Arthritis    OA BOTH KNEES; OCCAS PAIN LEFT SHOULDER - HX OF ROTATOR CUFF PROBLEM  . Dyspepsia   . GERD (gastroesophageal reflux disease)   . GI bleed    GI BLEED FROM DUODENAL ULCER--ABOUT 17 YRS AGO - GIVEN TRANSFUSIONS  . History of blood transfusion    1990's  . History of frequent urinary tract infections   . History of PSVT (paroxysmal supraventricular tachycardia)    can control by doing a valsalva. Triggered by elevated heart rate  . Hypertension   . Imbalance   . Migraine headache    much reduced frequency  .  Tachycardia    Episodic  . Tingling    left foot   . TMJ (temporomandibular joint disorder)    PAST HX GRINDING TEETH --BUT NO LONGER A PROBLEM  . Wears glasses     Assessment/Plan: 1 Day Post-Op Procedure(s) (LRB): TOTAL HIP ARTHROPLASTY ANTERIOR APPROACH (Right) Principal Problem:   OA (osteoarthritis) of hip  Estimated body mass index is 25.06 kg/m as calculated from the following:   Height as of this encounter: 5\' 7"  (1.702 m).   Weight as of this encounter: 72.6 kg. Advance diet Up with therapy D/C IV fluids  DVT Prophylaxis - Aspirin Weight bearing as tolerated. D/C O2 and pulse ox and try on room air. Hemovac pulled without difficulty, will continue therapy.  Plan is to go Home with HEP after hospital stay. Plan for discharge this afternoon as long as patient progresses with therapy and is meeting her goals. Follow-up in the office in 2 weeks with Dr. Lequita HaltAluisio.  Arther AbbottKristie Edmisten, PA-C Orthopedic Surgery 03/07/2018, 7:50 AM

## 2018-03-08 ENCOUNTER — Telehealth: Payer: Self-pay | Admitting: *Deleted

## 2018-03-08 NOTE — Telephone Encounter (Signed)
Pt was on TCM report admitted 03/03/18 for right total hip arthroplasty. Pt D/C 03/06/18 she will follow-up visit in 2 weeks with Dr. Lequita HaltAluisio.Marland Kitchen.Raechel Chute/lmb

## 2018-03-11 NOTE — Discharge Summary (Signed)
Physician Discharge Summary   Patient ID: Kaylee Davis MRN: [id number] DOB/AGE: 10/28/1940 77 y.o.  Admit date: 03/06/2018 Discharge date: 03/07/2018  Primary Diagnosis: Osteoarthritis, right hip   Admission Diagnoses:  Past Medical History:  Diagnosis Date  . Arthritis    OA BOTH KNEES; OCCAS PAIN LEFT SHOULDER - HX OF ROTATOR CUFF PROBLEM  . Dyspepsia   . GERD (gastroesophageal reflux disease)   . GI bleed    GI BLEED FROM DUODENAL ULCER--ABOUT 17 YRS AGO - GIVEN TRANSFUSIONS  . History of blood transfusion    1990's  . History of frequent urinary tract infections   . History of PSVT (paroxysmal supraventricular tachycardia)    can control by doing a valsalva. Triggered by elevated heart rate  . Hypertension   . Imbalance   . Migraine headache    much reduced frequency  . Tachycardia    Episodic  . Tingling    left foot   . TMJ (temporomandibular joint disorder)    PAST HX GRINDING TEETH --BUT NO LONGER A PROBLEM  . Wears glasses    Discharge Diagnoses:   Principal Problem:   OA (osteoarthritis) of hip  Estimated body mass index is 25.06 kg/m as calculated from the following:   Height as of this encounter: 5\' 7"  (1.702 m).   Weight as of this encounter: 72.6 kg.  Procedure:  Procedure(s) (LRB): TOTAL HIP ARTHROPLASTY ANTERIOR APPROACH (Right)   Consults: None  HPI: Kaylee Davis is a 77 y.o. female who has advanced end-stage arthritis of their Right  hip with progressively worsening pain and dysfunction.The patient has failed nonoperative management and presents for  total hip arthroplasty.   Laboratory Data: Admission on 03/06/2018, Discharged on 03/07/2018  Component Date Value Ref Range Status  . WBC 03/07/2018 16.5* 4.0 - 10.5 K/uL Final  . RBC 03/07/2018 3.56* 3.87 - 5.11 MIL/uL Final  . Hemoglobin 03/07/2018 10.2* 12.0 - 15.0 g/dL Final  . HCT 21/30/865712/19/2019 32.3* 36.0 - 46.0 % Final  . MCV 03/07/2018 90.7  80.0 - 100.0 fL Final  . MCH  03/07/2018 28.7  26.0 - 34.0 pg Final  . MCHC 03/07/2018 31.6  30.0 - 36.0 g/dL Final  . RDW 84/69/629512/19/2019 12.6  11.5 - 15.5 % Final  . Platelets 03/07/2018 254  150 - 400 K/uL Final  . nRBC 03/07/2018 0.0  0.0 - 0.2 % Final   Performed at [facility], [address] [place], [place] [address]  . Sodium 03/07/2018 139  135 - 145 mmol/L Final  . Potassium 03/07/2018 3.7  3.5 - 5.1 mmol/L Final  . Chloride 03/07/2018 105  98 - 111 mmol/L Final  . CO2 03/07/2018 25  22 - 32 mmol/L Final  . Glucose, Bld 03/07/2018 122* 70 - 99 mg/dL Final  . BUN 24/40/102712/19/2019 9  8 - 23 mg/dL Final  . Creatinine, Ser 03/07/2018 0.60  0.44 - 1.00 mg/dL Final  . Calcium 25/36/644012/19/2019 8.8* 8.9 - 10.3 mg/dL Final  . GFR calc non Af Amer 03/07/2018 >60  >60 mL/min Final  . GFR calc Af Amer 03/07/2018 >60  >60 mL/min Final  . Anion gap 03/07/2018 9  5 - 15 Final   Performed at [facility], [address] [place], [place] [address]  Hospital Outpatient Visit on 03/01/2018  Component Date Value Ref Range Status  . MRSA, PCR 03/01/2018 NEGATIVE  NEGATIVE Final  . Staphylococcus aureus 03/01/2018 NEGATIVE  NEGATIVE Final   Comment: (NOTE) The  Xpert SA Assay (FDA approved for NASAL specimens in patients 38 years of age and older), is one component of a comprehensive surveillance program. It is not intended to diagnose infection nor to guide or monitor treatment. Performed at [facility], [address] [place], [place] [address]   . aPTT 03/01/2018 29  24 - 36 seconds Final   Performed at [facility], [address], [place], [place] [address]  . WBC 03/01/2018 8.7  4.0 - 10.5 K/uL Final  . RBC 03/01/2018 4.60  3.87 - 5.11 MIL/uL Final  . Hemoglobin 03/01/2018 13.2  12.0 - 15.0 g/dL Final  . HCT 09/81/1914 41.6  36.0 - 46.0 % Final  . MCV 03/01/2018 90.4  80.0 - 100.0 fL Final  . MCH 03/01/2018 28.7  26.0 - 34.0 pg Final  . MCHC  03/01/2018 31.7  30.0 - 36.0 g/dL Final  . RDW 78/29/5621 13.0  11.5 - 15.5 % Final  . Platelets 03/01/2018 331  150 - 400 K/uL Final  . nRBC 03/01/2018 0.0  0.0 - 0.2 % Final   Performed at [facility], [address], [place], [place] [address]  . Sodium 03/01/2018 141  135 - 145 mmol/L Final  . Potassium 03/01/2018 4.0  3.5 - 5.1 mmol/L Final  . Chloride 03/01/2018 104  98 - 111 mmol/L Final  . CO2 03/01/2018 27  22 - 32 mmol/L Final  . Glucose, Bld 03/01/2018 106* 70 - 99 mg/dL Final  . BUN 78/46/9629 17  8 - 23 mg/dL Final  . Creatinine, Ser 03/01/2018 0.96  0.44 - 1.00 mg/dL Final  . Calcium 52/84/1324 9.1  8.9 - 10.3 mg/dL Final  . Total Protein 03/01/2018 7.2  6.5 - 8.1 g/dL Final  . Albumin 40/12/2723 4.3  3.5 - 5.0 g/dL Final  . AST 36/64/4034 20  15 - 41 U/L Final  . ALT 03/01/2018 10  0 - 44 U/L Final  . Alkaline Phosphatase 03/01/2018 108  38 - 126 U/L Final  . Total Bilirubin 03/01/2018 0.6  0.3 - 1.2 mg/dL Final  . GFR calc non Af Amer 03/01/2018 56* >60 mL/min Final  . GFR calc Af Amer 03/01/2018 >60  >60 mL/min Final  . Anion gap 03/01/2018 10  5 - 15 Final   Performed at [facility], [address], [place], [place] [address]  . Prothrombin Time 03/01/2018 12.4  11.4 - 15.2 seconds Final  . INR 03/01/2018 0.94   Final   Performed at [facility], [address] [place], [place] [address]  . ABO/RH(D) 03/01/2018 O POS   Final  . Antibody Screen 03/01/2018 NEG   Final  . Sample Expiration 03/01/2018 03/09/2018   Final  . Extend sample reason 03/01/2018    Final                   Value:NO TRANSFUSIONS OR PREGNANCY IN THE PAST 3 MONTHS Performed at [facility], [address], [place], [place] [address]      X-Rays:Dg Pelvis Portable  Result Date: 03/06/2018 CLINICAL DATA:  Status post right knee replacement EXAM: PORTABLE PELVIS 1-2 VIEWS COMPARISON:  07/04/2017 FINDINGS: Right hip  prosthesis is noted in satisfactory position. Surgical drain is noted in place. No acute bony abnormality is noted. IMPRESSION: Status post right hip replacement Electronically Signed   By: Alcide Clever M.D.   On: 03/06/2018 10:50   Dg C-arm 1-60 Min-no Report  Result Date: 03/06/2018 Fluoroscopy was utilized by the requesting physician.  No radiographic interpretation.   Dg Hip Operative Unilat With Pelvis Right  Result Date: 03/06/2018 CLINICAL DATA:  Right hip replacement EXAM: OPERATIVE right HIP (WITH PELVIS IF PERFORMED) 4 VIEWS TECHNIQUE: Fluoroscopic spot image(s) were submitted for interpretation post-operatively. COMPARISON:  None. FINDINGS: For some images show total hip arthroplasty on the right. Components appear well positioned. Density presumably representing a sponge remains in place on the last image. IMPRESSION: Right hip arthroplasty. Sponge or similar density on the last image. Electronically Signed   By: Paulina FusiMark  Shogry M.D.   On: 03/06/2018 12:03    EKG: Orders placed or performed in visit on 01/10/18  . EKG 12-Lead     Hospital Course: Kaylee Davis is a 77 y.o. who was admitted to [facility]. They were brought to the operating room on 03/06/2018 and underwent Procedure(s): TOTAL HIP ARTHROPLASTY ANTERIOR APPROACH.  Patient tolerated the procedure well and was later transferred to the recovery room and then to the orthopaedic floor for postoperative care. They were given PO and IV analgesics for pain control following their surgery. They were given 24 hours of postoperative antibiotics of  Anti-infectives (From admission, onward)   Start     Dose/Rate Route Frequency Ordered Stop   03/06/18 2000  vancomycin (VANCOCIN) IVPB 1000 mg/200 mL premix     1,000 mg 200 mL/hr over 60 Minutes Intravenous Every 12 hours 03/06/18 1120 03/06/18 2044   03/06/18 0630  vancomycin (VANCOCIN) IVPB 1000 mg/200 mL premix     1,000 mg 200 mL/hr over 60 Minutes Intravenous On  call to O.R. 03/06/18 [id number] 03/06/18 1127     and started on DVT prophylaxis in the form of Aspirin.   PT and OT were ordered for total joint protocol. Discharge planning consulted to help with postop disposition and equipment needs.  Patient had a good night on the evening of surgery. They started to get up OOB with therapy on POD #0. Pt was seen during rounds and was ready to go home pending progress with therapy. Hemovac drain was pulled without difficulty. She worked with therapy on POD #1 and was meeting her goals. Pt was discharged to home later that day in stable condition.  Diet: Regular diet Activity: WBAT Follow-up: in 2 weeks with Dr. Lequita HaltAluisio Disposition: Home with HEP Discharged Condition: stable   Discharge Instructions    Call MD / Call 911   Complete by:  As directed    If you experience chest pain or shortness of breath, CALL 911 and be transported to the hospital emergency room.  If you develope a fever above 101 F, pus (white drainage) or increased drainage or redness at the wound, or calf pain, call your surgeon's office.   Change dressing   Complete by:  As directed    You may change your dressing on Friday, then change the dressing daily with sterile 4 x 4 inch gauze dressing and paper tape.   Constipation Prevention   Complete by:  As directed    Drink plenty of fluids.  Prune juice may be helpful.  You may use a stool softener, such as Colace (over the counter) 100 mg twice a day.  Use MiraLax (over the counter) for constipation as needed.   Diet - low sodium heart healthy   Complete by:  As directed    Discharge instructions   Complete by:  As directed    Dr. Ollen GrossFrank Aluisio Total  Joint Specialist [facility] [address] [place], [place] [address] [phone]  ANTERIOR APPROACH TOTAL HIP REPLACEMENT POSTOPERATIVE DIRECTIONS   Hip Rehabilitation, Guidelines Following Surgery  The results of a hip operation are greatly improved after range of  motion and muscle strengthening exercises. Follow all safety measures which are given to protect your hip. If any of these exercises cause increased pain or swelling in your joint, decrease the amount until you are comfortable again. Then slowly increase the exercises. Call your caregiver if you have problems or questions.   HOME CARE INSTRUCTIONS  Remove items at home which could result in a fall. This includes throw rugs or furniture in walking pathways.  ICE to the affected hip every three hours for 30 minutes at a time and then as needed for pain and swelling.  Continue to use ice on the hip for pain and swelling from surgery. You may notice swelling that will progress down to the foot and ankle.  This is normal after surgery.  Elevate the leg when you are not up walking on it.   Continue to use the breathing machine which will help keep your temperature down.  It is common for your temperature to cycle up and down following surgery, especially at night when you are not up moving around and exerting yourself.  The breathing machine keeps your lungs expanded and your temperature down.  DIET You may resume your previous home diet once your are discharged from the hospital.  DRESSING / WOUND CARE / SHOWERING You may shower 3 days after surgery, but keep the wounds dry during showering.  You may use an occlusive plastic wrap (Press'n Seal for example), NO SOAKING/SUBMERGING IN THE BATHTUB.  If the bandage gets wet, change with a clean dry gauze.  If the incision gets wet, pat the wound dry with a clean towel. You may start showering once you are discharged home but do not submerge the incision under water. Just pat the incision dry and apply a dry gauze dressing on daily. Change the surgical dressing daily and reapply a dry dressing each time.  ACTIVITY Walk with your walker as instructed. Use walker as long as suggested by your caregivers. Avoid periods of inactivity such as sitting longer than an  hour when not asleep. This helps prevent blood clots.  You may resume a sexual relationship in one month or when given the OK by your doctor.  You may return to work once you are cleared by your doctor.  Do not drive a car for 6 weeks or until released by you surgeon.  Do not drive while taking narcotics.  WEIGHT BEARING Weight bearing as tolerated with assist device (walker, cane, etc) as directed, use it as long as suggested by your surgeon or therapist, typically at least 4-6 weeks.  POSTOPERATIVE CONSTIPATION PROTOCOL Constipation - defined medically as fewer than three stools per week and severe constipation as less than one stool per week.  One of the most common issues patients have following surgery is constipation.  Even if you have a regular bowel pattern at home, your normal regimen is likely to be disrupted due to multiple reasons following surgery.  Combination of anesthesia, postoperative narcotics, change in appetite and fluid intake all can affect your bowels.  In order to avoid complications following surgery, here are some recommendations in order to help you during your recovery period.  Colace (docusate) - Pick up an over-the-counter form of Colace or another stool softener  and take twice a day as long as you are requiring postoperative pain medications.  Take with a full glass of water daily.  If you experience loose stools or diarrhea, hold the colace until you stool forms back up.  If your symptoms do not get better within 1 week or if they get worse, check with your doctor.  Dulcolax (bisacodyl) - Pick up over-the-counter and take as directed by the product packaging as needed to assist with the movement of your bowels.  Take with a full glass of water.  Use this product as needed if not relieved by Colace only.   MiraLax (polyethylene glycol) - Pick up over-the-counter to have on hand.  MiraLax is a solution that will increase the amount of water in your bowels to assist  with bowel movements.  Take as directed and can mix with a glass of water, juice, soda, coffee, or tea.  Take if you go more than two days without a movement. Do not use MiraLax more than once per day. Call your doctor if you are still constipated or irregular after using this medication for 7 days in a row.  If you continue to have problems with postoperative constipation, please contact the office for further assistance and recommendations.  If you experience "the worst abdominal pain ever" or develop nausea or vomiting, please contact the office immediatly for further recommendations for treatment.  ITCHING  If you experience itching with your medications, try taking only a single pain pill, or even half a pain pill at a time.  You can also use Benadryl over the counter for itching or also to help with sleep.   TED HOSE STOCKINGS Wear the elastic stockings on both legs for three weeks following surgery during the day but you may remove then at night for sleeping.  MEDICATIONS See your medication summary on the "After Visit Summary" that the nursing staff will review with you prior to discharge.  You may have some home medications which will be placed on hold until you complete the course of blood thinner medication.  It is important for you to complete the blood thinner medication as prescribed by your surgeon.  Continue your approved medications as instructed at time of discharge.  PRECAUTIONS If you experience chest pain or shortness of breath - call 911 immediately for transfer to the hospital emergency department.  If you develop a fever greater that 101 F, purulent drainage from wound, increased redness or drainage from wound, foul odor from the wound/dressing, or calf pain - CONTACT YOUR SURGEON.                                                   FOLLOW-UP APPOINTMENTS Make sure you keep all of your appointments after your operation with your surgeon and caregivers. You should call the  office at the above phone number and make an appointment for approximately two weeks after the date of your surgery or on the date instructed by your surgeon outlined in the "After Visit Summary".  RANGE OF MOTION AND STRENGTHENING EXERCISES  These exercises are designed to help you keep full movement of your hip joint. Follow your caregiver's or physical therapist's instructions. Perform all exercises about fifteen times, three times per day or as directed. Exercise both hips, even if you have had only one joint replacement. These  exercises can be done on a training (exercise) mat, on the floor, on a table or on a bed. Use whatever works the best and is most comfortable for you. Use music or television while you are exercising so that the exercises are a pleasant break in your day. This will make your life better with the exercises acting as a break in routine you can look forward to.  Lying on your back, slowly slide your foot toward your buttocks, raising your knee up off the floor. Then slowly slide your foot back down until your leg is straight again.  Lying on your back spread your legs as far apart as you can without causing discomfort.  Lying on your side, raise your upper leg and foot straight up from the floor as far as is comfortable. Slowly lower the leg and repeat.  Lying on your back, tighten up the muscle in the front of your thigh (quadriceps muscles). You can do this by keeping your leg straight and trying to raise your heel off the floor. This helps strengthen the largest muscle supporting your knee.  Lying on your back, tighten up the muscles of your buttocks both with the legs straight and with the knee bent at a comfortable angle while keeping your heel on the floor.   IF YOU ARE TRANSFERRED TO A SKILLED REHAB FACILITY If the patient is transferred to a skilled rehab facility following release from the hospital, a list of the current medications will be sent to the facility for the  patient to continue.  When discharged from the skilled rehab facility, please have the facility set up the patient's Home Health Physical Therapy prior to being released. Also, the skilled facility will be responsible for providing the patient with their medications at time of release from the facility to include their pain medication, the muscle relaxants, and their blood thinner medication. If the patient is still at the rehab facility at time of the two week follow up appointment, the skilled rehab facility will also need to assist the patient in arranging follow up appointment in our office and any transportation needs.  MAKE SURE YOU:  Understand these instructions.  Get help right away if you are not doing well or get worse.    Pick up stool softner and laxative for home use following surgery while on pain medications. Do not submerge incision under water. Please use good hand washing techniques while changing dressing each day. May shower starting three days after surgery. Please use a clean towel to pat the incision dry following showers. Continue to use ice for pain and swelling after surgery. Do not use any lotions or creams on the incision until instructed by your surgeon.   Do not sit on low chairs, stoools or toilet seats, as it may be difficult to get up from low surfaces   Complete by:  As directed    Driving restrictions   Complete by:  As directed    No driving for two weeks   TED hose   Complete by:  As directed    Use stockings (TED hose) for three weeks on both leg(s).  You may remove them at night for sleeping.   Weight bearing as tolerated   Complete by:  As directed      Allergies as of 03/07/2018      Reactions   Nitrofuran Derivatives Other (See Comments)   Chest pain and elevated liver enzymes   Other    PT STATES  THE SULFA AND PENICILLIN SHE TOOK MANY YEARS AGO BOTH HAD RED DYES--SO SHE WONDERED IF ALLERGIC TO RED DYES   Penicillins Hives   Has had  penicillin since and has been fine.    Sulfonamide Derivatives Hives      Medication List    STOP taking these medications   meloxicam 15 MG tablet Commonly known as:  MOBIC     TAKE these medications   amLODipine 10 MG tablet Commonly known as:  NORVASC TAKE 1 BY MOUTH EVERY MORNING What changed:    how much to take  how to take this  when to take this   aspirin 325 MG EC tablet Take 1 tablet (325 mg total) by mouth 2 (two) times daily for 20 days. Take one tablet (325 mg) Aspirin two times a day for three weeks following surgery. Then take one baby Aspirin (81 mg) once a day for three weeks. Then discontinue aspirin.   esomeprazole 20 MG capsule Commonly known as:  NEXIUM Take 1 capsule (20 mg total) by mouth every morning.   furosemide 40 MG tablet Commonly known as:  LASIX TAKE 1 TABLET BY MOUTH ONCE DAILY   HYDROcodone-acetaminophen 5-325 MG tablet Commonly known as:  NORCO/VICODIN Take 1-2 tablets by mouth every 6 (six) hours as needed for severe pain.   methocarbamol 500 MG tablet Commonly known as:  ROBAXIN Take 1 tablet (500 mg total) by mouth every 6 (six) hours as needed for muscle spasms.   PREMARIN vaginal cream Generic drug:  conjugated estrogens Place 1 Applicatorful vaginally once a week.   traMADol 50 MG tablet Commonly known as:  ULTRAM Take 1-2 tablets (50-100 mg total) by mouth every 6 (six) hours as needed for moderate pain.            Discharge Care Instructions  (From admission, onward)         Start     Ordered   03/07/18 0000  Weight bearing as tolerated     03/07/18 0753   03/07/18 0000  Change dressing    Comments:  You may change your dressing on Friday, then change the dressing daily with sterile 4 x 4 inch gauze dressing and paper tape.   03/07/18 0753         Follow-up Information    Ollen Gross, MD. Schedule an appointment as soon as possible for a visit on 03/21/2018.   Specialty:  Orthopedic Surgery Contact  information: [address] [place] [address] [place] [address] [phone]           Signed: Arther Abbott, PA-C Orthopedic Surgery 03/11/2018, 7:50 AM

## 2018-04-01 ENCOUNTER — Ambulatory Visit: Payer: Medicare Other | Admitting: Internal Medicine

## 2018-04-01 ENCOUNTER — Encounter: Payer: Self-pay | Admitting: Internal Medicine

## 2018-04-01 DIAGNOSIS — J069 Acute upper respiratory infection, unspecified: Secondary | ICD-10-CM | POA: Diagnosis not present

## 2018-04-01 MED ORDER — AZITHROMYCIN 250 MG PO TABS: ORAL_TABLET | ORAL | 0 refills | Status: DC

## 2018-04-01 NOTE — Assessment & Plan Note (Signed)
Z pac if worse OTC meds

## 2018-04-01 NOTE — Patient Instructions (Signed)
You can use over-the-counter  "cold" medicines  such as "Afrin" nasal spray for nasal congestion as directed. Use " Delsym" or" Robitussin" cough syrup varietis for cough.  You can use plain "Tylenol" or "Advil" for fever, chills and achyness. Use Halls or Ricola cough drops.   "Common cold" symptoms are usually triggered by a virus.  The antibiotics are usually not necessary. On average, a" viral cold" illness would take 4-7 days to resolve.   Please, make an appointment if you are not better or if you're worse.  

## 2018-04-01 NOTE — Progress Notes (Signed)
Subjective:  Patient ID: Kaylee Davis, female    DOB: 1940/04/10  Age: 78 y.o. MRN: [id number]  CC: No chief complaint on file.   HPI Kaylee HastenBrenda B Attia presents for URI sx's - cough S/p recent surgery    Outpatient Medications Prior to Visit  Medication Sig Dispense Refill  . amLODipine (NORVASC) 10 MG tablet TAKE 1 BY MOUTH EVERY MORNING 90 tablet 1  . esomeprazole (NEXIUM) 20 MG capsule Take 1 capsule (20 mg total) by mouth every morning. 90 capsule 3  . furosemide (LASIX) 40 MG tablet TAKE 1 TABLET BY MOUTH ONCE DAILY 90 tablet 3  . PREMARIN vaginal cream Place 1 Applicatorful vaginally once a week.   5  . HYDROcodone-acetaminophen (NORCO/VICODIN) 5-325 MG tablet Take 1-2 tablets by mouth every 6 (six) hours as needed for severe pain. (Patient not taking: Reported on 04/01/2018) 56 tablet 0  . methocarbamol (ROBAXIN) 500 MG tablet Take 1 tablet (500 mg total) by mouth every 6 (six) hours as needed for muscle spasms. (Patient not taking: Reported on 04/01/2018) 40 tablet 0  . traMADol (ULTRAM) 50 MG tablet Take 1-2 tablets (50-100 mg total) by mouth every 6 (six) hours as needed for moderate pain. (Patient not taking: Reported on 04/01/2018) 40 tablet 0   No facility-administered medications prior to visit.     ROS: Review of Systems  Constitutional: Negative for activity change, appetite change, chills, fatigue and unexpected weight change.  HENT: Positive for congestion and sore throat. Negative for mouth sores and sinus pressure.   Eyes: Negative for visual disturbance.  Respiratory: Positive for cough. Negative for chest tightness.   Gastrointestinal: Negative for abdominal pain and nausea.  Genitourinary: Negative for difficulty urinating, frequency and vaginal pain.  Musculoskeletal: Negative for back pain and gait problem.  Skin: Negative for pallor and rash.  Neurological: Negative for dizziness, tremors, weakness, numbness and headaches.  Psychiatric/Behavioral:  Negative for confusion and sleep disturbance.    Objective:  BP 128/66 (BP Location: Left Arm, Patient Position: Sitting, Cuff Size: Normal)   Pulse 73   Temp 98.3 F (36.8 C) (Oral)   Ht 5\' 7"  (1.702 m)   Wt 159 lb (72.1 kg)   LMP 03/21/1983   SpO2 98%   BMI 24.90 kg/m   BP Readings from Last 3 Encounters:  04/01/18 128/66  03/07/18 (!) 127/56  03/01/18 124/64    Wt Readings from Last 3 Encounters:  04/01/18 159 lb (72.1 kg)  03/06/18 160 lb (72.6 kg)  03/01/18 160 lb (72.6 kg)    Physical Exam Constitutional:      General: She is not in acute distress.    Appearance: She is well-developed.  HENT:     Head: Normocephalic.     Right Ear: External ear normal.     Left Ear: External ear normal.     Nose: Nose normal.  Eyes:     General:        Right eye: No discharge.        Left eye: No discharge.     Conjunctiva/sclera: Conjunctivae normal.     Pupils: Pupils are equal, round, and reactive to light.  Neck:     Musculoskeletal: Normal range of motion and neck supple.     Thyroid: No thyromegaly.     Vascular: No JVD.     Trachea: No tracheal deviation.  Cardiovascular:     Rate and Rhythm: Normal rate and regular rhythm.     Heart sounds: Normal heart  sounds.  Pulmonary:     Effort: No respiratory distress.     Breath sounds: No stridor. No wheezing.  Abdominal:     General: Bowel sounds are normal. There is no distension.     Palpations: Abdomen is soft. There is no mass.     Tenderness: There is no abdominal tenderness. There is no guarding or rebound.  Musculoskeletal:        General: No tenderness.  Lymphadenopathy:     Cervical: No cervical adenopathy.  Skin:    Findings: No erythema or rash.  Neurological:     Cranial Nerves: No cranial nerve deficit.     Motor: No abnormal muscle tone.     Coordination: Coordination normal.     Deep Tendon Reflexes: Reflexes normal.  Psychiatric:        Behavior: Behavior normal.        Thought Content:  Thought content normal.        Judgment: Judgment normal.   eryth throat  Lab Results  Component Value Date   WBC 16.5 (H) 03/07/2018   HGB 10.2 (L) 03/07/2018   HCT 32.3 (L) 03/07/2018   PLT 254 03/07/2018   GLUCOSE 122 (H) 03/07/2018   CHOL 215 (H) 01/10/2018   TRIG 221.0 (H) 01/10/2018   HDL 52.10 01/10/2018   LDLDIRECT 130.0 01/10/2018   LDLCALC 88 02/17/2016   ALT 10 03/01/2018   AST 20 03/01/2018   NA 139 03/07/2018   K 3.7 03/07/2018   CL 105 03/07/2018   CREATININE 0.60 03/07/2018   BUN 9 03/07/2018   CO2 25 03/07/2018   TSH 2.96 02/25/2016   INR 0.94 03/01/2018   HGBA1C 5.5 02/16/2016    No results found.  Assessment & Plan:   There are no diagnoses linked to this encounter.   No orders of the defined types were placed in this encounter.    Follow-up: No follow-ups on file.  Sonda Primes, MD

## 2018-04-12 DIAGNOSIS — Z96641 Presence of right artificial hip joint: Secondary | ICD-10-CM | POA: Diagnosis not present

## 2018-04-12 DIAGNOSIS — Z471 Aftercare following joint replacement surgery: Secondary | ICD-10-CM | POA: Diagnosis not present

## 2018-05-21 DIAGNOSIS — H18413 Arcus senilis, bilateral: Secondary | ICD-10-CM | POA: Diagnosis not present

## 2018-05-21 DIAGNOSIS — H2513 Age-related nuclear cataract, bilateral: Secondary | ICD-10-CM | POA: Diagnosis not present

## 2018-05-21 DIAGNOSIS — H25013 Cortical age-related cataract, bilateral: Secondary | ICD-10-CM | POA: Diagnosis not present

## 2018-05-21 DIAGNOSIS — H02834 Dermatochalasis of left upper eyelid: Secondary | ICD-10-CM | POA: Diagnosis not present

## 2018-05-23 ENCOUNTER — Other Ambulatory Visit: Payer: Self-pay | Admitting: Internal Medicine

## 2018-06-12 ENCOUNTER — Ambulatory Visit: Payer: Medicare Other | Admitting: Obstetrics and Gynecology

## 2018-08-06 ENCOUNTER — Other Ambulatory Visit: Payer: Self-pay | Admitting: Internal Medicine

## 2018-08-07 ENCOUNTER — Other Ambulatory Visit: Payer: Self-pay | Admitting: Internal Medicine

## 2018-10-25 ENCOUNTER — Other Ambulatory Visit: Payer: Self-pay | Admitting: Internal Medicine

## 2019-01-10 ENCOUNTER — Other Ambulatory Visit: Payer: Self-pay

## 2019-01-10 MED ORDER — AMLODIPINE BESYLATE 10 MG PO TABS: ORAL_TABLET | ORAL | 1 refills | Status: DC

## 2019-01-13 ENCOUNTER — Ambulatory Visit (INDEPENDENT_AMBULATORY_CARE_PROVIDER_SITE_OTHER): Payer: Medicare Other | Admitting: Internal Medicine

## 2019-01-13 ENCOUNTER — Other Ambulatory Visit: Payer: Self-pay

## 2019-01-13 ENCOUNTER — Other Ambulatory Visit (INDEPENDENT_AMBULATORY_CARE_PROVIDER_SITE_OTHER): Payer: Medicare Other

## 2019-01-13 ENCOUNTER — Encounter: Payer: Self-pay | Admitting: Internal Medicine

## 2019-01-13 VITALS — BP 130/84 | HR 72 | Temp 97.8°F | Ht 67.0 in | Wt 163.0 lb

## 2019-01-13 DIAGNOSIS — Z23 Encounter for immunization: Secondary | ICD-10-CM | POA: Diagnosis not present

## 2019-01-13 DIAGNOSIS — Z0000 Encounter for general adult medical examination without abnormal findings: Secondary | ICD-10-CM

## 2019-01-13 DIAGNOSIS — K219 Gastro-esophageal reflux disease without esophagitis: Secondary | ICD-10-CM | POA: Diagnosis not present

## 2019-01-13 DIAGNOSIS — I10 Essential (primary) hypertension: Secondary | ICD-10-CM

## 2019-01-13 LAB — CBC
HCT: 40.6 % (ref 36.0–46.0)
Hemoglobin: 13.4 g/dL (ref 12.0–15.0)
MCHC: 33 g/dL (ref 30.0–36.0)
MCV: 85.8 fl (ref 78.0–100.0)
Platelets: 318 10*3/uL (ref 150.0–400.0)
RBC: 4.73 Mil/uL (ref 3.87–5.11)
RDW: 13.7 % (ref 11.5–15.5)
WBC: 6.9 10*3/uL (ref 4.0–10.5)

## 2019-01-13 LAB — COMPREHENSIVE METABOLIC PANEL
ALT: 7 U/L (ref 0–35)
AST: 16 U/L (ref 0–37)
Albumin: 4.3 g/dL (ref 3.5–5.2)
Alkaline Phosphatase: 109 U/L (ref 39–117)
BUN: 12 mg/dL (ref 6–23)
CO2: 29 mEq/L (ref 19–32)
Calcium: 9.1 mg/dL (ref 8.4–10.5)
Chloride: 105 mEq/L (ref 96–112)
Creatinine, Ser: 0.82 mg/dL (ref 0.40–1.20)
GFR: 66.97 mL/min (ref >60.00)
Glucose, Bld: 95 mg/dL (ref 70–99)
Potassium: 4 mEq/L (ref 3.5–5.1)
Sodium: 141 mEq/L (ref 135–145)
Total Bilirubin: 0.5 mg/dL (ref 0.2–1.2)
Total Protein: 7.2 g/dL (ref 6.0–8.3)

## 2019-01-13 LAB — LIPID PANEL
Cholesterol: 212 mg/dL — ABNORMAL HIGH (ref 0–200)
HDL: 61.8 mg/dL (ref >39.00)
LDL Cholesterol: 134 mg/dL — ABNORMAL HIGH (ref 0–99)
NonHDL: 150.35
Total CHOL/HDL Ratio: 3
Triglycerides: 83 mg/dL (ref 0.0–149.0)
VLDL: 16.6 mg/dL (ref 0.0–40.0)

## 2019-01-13 NOTE — Assessment & Plan Note (Signed)
Taking nexium and well controlled.

## 2019-01-13 NOTE — Assessment & Plan Note (Signed)
BP at goal on lasix and amlodipine. Checking CMP and adjust as needed.  

## 2019-01-13 NOTE — Assessment & Plan Note (Signed)
Flu shot given. Pneumonia complete. Shingrix counseled. Tetanus given due 2030. Colonoscopy aged out. Mammogram aged out, pap smear aged out and dexa declines. Counseled about sun safety and mole surveillance. Counseled about the dangers of distracted driving. Given 10 year screening recommendations.

## 2019-01-13 NOTE — Progress Notes (Signed)
Subjective:   Patient ID: Kaylee Davis, female    DOB: 1940/04/14, 78 y.o.   MRN: [id number]  HPI Here for medicare wellness and physical, no new complaints. Please see A/P for status and treatment of chronic medical problems.   Diet: heart healthy  Physical activity: sedentary Depression/mood screen: negative Hearing: intact to whispered voice Visual acuity: grossly normal with lens, performs annual eye exam  ADLs: capable Fall risk: low Home safety: good Cognitive evaluation: intact to orientation, naming, recall and repetition EOL planning: adv directives discussed    Office Visit from 01/13/2019 in [place]  PHQ-2 Total Score  0      I have personally reviewed and have noted 1. The patient's medical and social history - reviewed today no changes 2. Their use of alcohol, tobacco or illicit drugs 3. Their current medications and supplements 4. The patient's functional ability including ADL's, fall risks, home safety risks and hearing or visual impairment. 5. Diet and physical activities 6. Evidence for depression or mood disorders 7. Care team reviewed and updated  Patient Care Team: Hoyt Koch, MD as PCP - General (Internal Medicine) [place] (Gynecology) Gaynelle Arabian, MD (Orthopedic Surgery) Past Medical History:  Diagnosis Date  . Arthritis    OA BOTH KNEES; OCCAS PAIN LEFT SHOULDER - HX OF ROTATOR CUFF PROBLEM  . Dyspepsia   . GERD (gastroesophageal reflux disease)   . GI bleed    GI BLEED FROM DUODENAL ULCER--ABOUT 17 YRS AGO - GIVEN TRANSFUSIONS  . History of blood transfusion    1990's  . History of frequent urinary tract infections   . History of PSVT (paroxysmal supraventricular tachycardia)    can control by doing a valsalva. Triggered by elevated heart rate  . Hypertension   . Imbalance   . Migraine headache    much reduced frequency  . Tachycardia    Episodic  . Tingling    left foot    . TMJ (temporomandibular joint disorder)    PAST HX GRINDING TEETH --BUT NO LONGER A PROBLEM  . Wears glasses    Past Surgical History:  Procedure Laterality Date  . ABDOMINAL HYSTERECTOMY  1985   TAH/BSO--for ?fibroids  . APPENDECTOMY    . ARTHROPLASTY     left Knee   . cyst removed      from mouth  . KNEE ARTHROSCOPY Left 10/2005  . LAPAROSCOPIC APPENDECTOMY  02/2000  . REPLACEMENT TOTAL KNEE Right   . TONSILLECTOMY    . TOTAL HIP ARTHROPLASTY Right 03/06/2018   Procedure: TOTAL HIP ARTHROPLASTY ANTERIOR APPROACH;  Surgeon: Gaynelle Arabian, MD;  Location: [facility];  Service: Orthopedics;  Laterality: Right;  111min  . 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 TOTAL KNEE ARTHROPLASTY Right 05/28/2015   Procedure: RIGHT TOTAL KNEE ARTHROPLASTY;  Surgeon: Gaynelle Arabian, MD;  Location: [facility];  Service: Orthopedics;  Laterality: Right;  . WISDOM TOOTH EXTRACTION  2008   Family History  Problem Relation Age of Onset  . Cancer Neg Hx        Breast/Colon  . Diabetes Neg Hx   . Heart disease Neg Hx        CAD/MI   Review of Systems  Constitutional: Negative.   HENT: Negative.   Eyes: Negative.   Respiratory: Negative for cough, chest tightness and shortness of breath.  Cardiovascular: Negative for chest pain, palpitations and leg swelling.  Gastrointestinal: Negative for abdominal distention, abdominal pain, constipation, diarrhea, nausea and vomiting.  Musculoskeletal: Negative.   Skin: Negative.   Neurological: Negative.   Psychiatric/Behavioral: Negative.     Objective:  Physical Exam Constitutional:      Appearance: She is well-developed.  HENT:     Head: Normocephalic and atraumatic.  Neck:     Musculoskeletal: Normal range of motion.  Cardiovascular:     Rate and Rhythm: Normal rate and regular rhythm.  Pulmonary:     Effort:  Pulmonary effort is normal. No respiratory distress.     Breath sounds: Normal breath sounds. No wheezing or rales.  Abdominal:     General: Bowel sounds are normal. There is no distension.     Palpations: Abdomen is soft.     Tenderness: There is no abdominal tenderness. There is no rebound.  Skin:    General: Skin is warm and dry.  Neurological:     Mental Status: She is alert and oriented to person, place, and time.     Coordination: Coordination normal.     Vitals:   01/13/19 0821  BP: 130/84  Pulse: 72  Temp: 97.8 F (36.6 C)  TempSrc: Oral  SpO2: 98%  Weight: 163 lb (73.9 kg)  Height: 5\' 7"  (1.702 m)    Assessment & Plan:

## 2019-01-13 NOTE — Patient Instructions (Signed)
Health Maintenance, Female Adopting a healthy lifestyle and getting preventive care are important in promoting health and wellness. Ask your health care provider about:  The right schedule for you to have regular tests and exams.  Things you can do on your own to prevent diseases and keep yourself healthy. What should I know about diet, weight, and exercise? Eat a healthy diet   Eat a diet that includes plenty of vegetables, fruits, low-fat dairy products, and lean protein.  Do not eat a lot of foods that are high in solid fats, added sugars, or sodium. Maintain a healthy weight Body mass index (BMI) is used to identify weight problems. It estimates body fat based on height and weight. Your health care provider can help determine your BMI and help you achieve or maintain a healthy weight. Get regular exercise Get regular exercise. This is one of the most important things you can do for your health. Most adults should:  Exercise for at least 150 minutes each week. The exercise should increase your heart rate and make you sweat (moderate-intensity exercise).  Do strengthening exercises at least twice a week. This is in addition to the moderate-intensity exercise.  Spend less time sitting. Even light physical activity can be beneficial. Watch cholesterol and blood lipids Have your blood tested for lipids and cholesterol at 80 years of age, then have this test every 5 years. Have your cholesterol levels checked more often if:  Your lipid or cholesterol levels are high.  You are older than 80 years of age.  You are at high risk for heart disease. What should I know about cancer screening? Depending on your health history and family history, you may need to have cancer screening at various ages. This may include screening for:  Breast cancer.  Cervical cancer.  Colorectal cancer.  Skin cancer.  Lung cancer. What should I know about heart disease, diabetes, and high blood  pressure? Blood pressure and heart disease  High blood pressure causes heart disease and increases the risk of stroke. This is more likely to develop in people who have high blood pressure readings, are of African descent, or are overweight.  Have your blood pressure checked: ? Every 3-5 years if you are 18-39 years of age. ? Every year if you are 40 years old or older. Diabetes Have regular diabetes screenings. This checks your fasting blood sugar level. Have the screening done:  Once every three years after age 40 if you are at a normal weight and have a low risk for diabetes.  More often and at a younger age if you are overweight or have a high risk for diabetes. What should I know about preventing infection? Hepatitis B If you have a higher risk for hepatitis B, you should be screened for this virus. Talk with your health care provider to find out if you are at risk for hepatitis B infection. Hepatitis C Testing is recommended for:  Everyone born from 1945 through 1965.  Anyone with known risk factors for hepatitis C. Sexually transmitted infections (STIs)  Get screened for STIs, including gonorrhea and chlamydia, if: ? You are sexually active and are younger than 80 years of age. ? You are older than 80 years of age and your health care provider tells you that you are at risk for this type of infection. ? Your sexual activity has changed since you were last screened, and you are at increased risk for chlamydia or gonorrhea. Ask your health care provider if   you are at risk.  Ask your health care provider about whether you are at high risk for HIV. Your health care provider may recommend a prescription medicine to help prevent HIV infection. If you choose to take medicine to prevent HIV, you should first get tested for HIV. You should then be tested every 3 months for as long as you are taking the medicine. Pregnancy  If you are about to stop having your period (premenopausal) and  you may become pregnant, seek counseling before you get pregnant.  Take 400 to 800 micrograms (mcg) of folic acid every day if you become pregnant.  Ask for birth control (contraception) if you want to prevent pregnancy. Osteoporosis and menopause Osteoporosis is a disease in which the bones lose minerals and strength with aging. This can result in bone fractures. If you are 65 years old or older, or if you are at risk for osteoporosis and fractures, ask your health care provider if you should:  Be screened for bone loss.  Take a calcium or vitamin D supplement to lower your risk of fractures.  Be given hormone replacement therapy (HRT) to treat symptoms of menopause. Follow these instructions at home: Lifestyle  Do not use any products that contain nicotine or tobacco, such as cigarettes, e-cigarettes, and chewing tobacco. If you need help quitting, ask your health care provider.  Do not use street drugs.  Do not share needles.  Ask your health care provider for help if you need support or information about quitting drugs. Alcohol use  Do not drink alcohol if: ? Your health care provider tells you not to drink. ? You are pregnant, may be pregnant, or are planning to become pregnant.  If you drink alcohol: ? Limit how much you use to 0-1 drink a day. ? Limit intake if you are breastfeeding.  Be aware of how much alcohol is in your drink. In the [place] one drink equals one 12 oz bottle of beer (355 mL), one 5 oz glass of wine (148 mL), or one 1 oz glass of hard liquor (44 mL). General instructions  Schedule regular health, dental, and eye exams.  Stay current with your vaccines.  Tell your health care provider if: ? You often feel depressed. ? You have ever been abused or do not feel safe at home. Summary  Adopting a healthy lifestyle and getting preventive care are important in promoting health and wellness.  Follow your health care provider's instructions about healthy  diet, exercising, and getting tested or screened for diseases.  Follow your health care provider's instructions on monitoring your cholesterol and blood pressure. This information is not intended to replace advice given to you by your health care provider. Make sure you discuss any questions you have with your health care provider. Document Released: 09/19/2010 Document Revised: 02/27/2018 Document Reviewed: 02/27/2018 Elsevier Patient Education  2020 [employer]vier Inc.  

## 2019-04-28 ENCOUNTER — Telehealth: Payer: Self-pay

## 2019-04-28 MED ORDER — FUROSEMIDE 40 MG PO TABS: 40.0000 mg | ORAL_TABLET | Freq: Every day | ORAL | 3 refills | Status: DC

## 2019-04-28 NOTE — Telephone Encounter (Signed)
New message    Medication Requested:   Is medication on med list (if no, inform pt they may need an appointment): furosemide (LASIX) 40 MG tablet  Is medication a controled (yes = last OV with PCP):   Is the OV > than 4 months (yes = schedule an appt if one is not already made):   Pharmacy (Name [address], [place]): [facility] [phone]

## 2019-05-18 ENCOUNTER — Ambulatory Visit: Payer: Medicare Other | Attending: Internal Medicine

## 2019-05-18 DIAGNOSIS — Z23 Encounter for immunization: Secondary | ICD-10-CM | POA: Insufficient documentation

## 2019-05-18 NOTE — Progress Notes (Signed)
   Covid-19 Vaccination Clinic  Name:  Kaylee Davis    MRN: [id number] DOB: 1940/11/30  05/18/2019  [name] was observed post Covid-19 immunization for 15 minutes without incidence. She was provided with Vaccine Information Sheet and instruction to access the V-Safe system.   [name] was instructed to call 911 with any severe reactions post vaccine: Marland Kitchen Difficulty breathing  . Swelling of your face and throat  . A fast heartbeat  . A bad rash all over your body  . Dizziness and weakness    Immunizations Administered    Name Date Dose VIS Date Route   Pfizer COVID-19 Vaccine 05/18/2019 10:20 AM 0.3 mL 02/28/2019 Intramuscular   Manufacturer: [employer], [employer]   Lot: [id number]   NDC: [address]-9

## 2019-06-17 ENCOUNTER — Ambulatory Visit: Payer: Medicare Other | Attending: Internal Medicine

## 2019-06-17 DIAGNOSIS — Z23 Encounter for immunization: Secondary | ICD-10-CM

## 2019-06-17 NOTE — Progress Notes (Signed)
   Covid-19 Vaccination Clinic  Name:  Kaylee Davis    MRN: [id number] DOB: 11/09/1940  06/17/2019  [name] was observed post Covid-19 immunization for 15 minutes without incident. She was provided with Vaccine Information Sheet and instruction to access the V-Safe system.   [name] was instructed to call 911 with any severe reactions post vaccine: Marland Kitchen Difficulty breathing  . Swelling of face and throat  . A fast heartbeat  . A bad rash all over body  . Dizziness and weakness   Immunizations Administered    Name Date Dose VIS Date Route   Pfizer COVID-19 Vaccine 06/17/2019  9:52 AM 0.3 mL 02/28/2019 Intramuscular   Manufacturer: [employer], [employer]   Lot: [id number]   NDC: [address]-1

## 2019-06-24 ENCOUNTER — Telehealth: Payer: Self-pay | Admitting: Internal Medicine

## 2019-06-24 NOTE — Telephone Encounter (Signed)
New message:   1.Medication Requested: meloxicam (MOBIC) 15 MG tablet 2. Pharmacy (Name, Street, [place]): [facility] - [place], [place] - [address] 3. On Med List: Yes  4. Last Visit with PCP: 01/13/19  5. Next visit date with PCP: None   Agent: Please be advised that RX refills may take up to 3 business days. We ask that you follow-up with your pharmacy.

## 2019-06-25 MED ORDER — MELOXICAM 15 MG PO TABS: 15.0000 mg | ORAL_TABLET | Freq: Every day | ORAL | 1 refills | Status: DC

## 2019-06-25 NOTE — Telephone Encounter (Signed)
Okay to refill? 

## 2019-06-25 NOTE — Telephone Encounter (Signed)
Erx has been sent as requested  

## 2019-09-10 DIAGNOSIS — N3020 Other chronic cystitis without hematuria: Secondary | ICD-10-CM | POA: Diagnosis not present

## 2019-10-31 ENCOUNTER — Telehealth: Payer: Self-pay | Admitting: Internal Medicine

## 2019-10-31 NOTE — Progress Notes (Signed)
  Chronic Care Management   Outreach Note  10/31/2019 Name: Kaylee Davis MRN: [id number] DOB: 03-30-1940  Referred by: Myrlene Broker, MD Reason for referral : No chief complaint on file.   An unsuccessful telephone outreach was attempted today. The patient was referred to the pharmacist for assistance with care management and care coordination.   Follow Up Plan:   Lynnae January Upstream Scheduler

## 2019-11-19 ENCOUNTER — Telehealth: Payer: Self-pay | Admitting: Internal Medicine

## 2019-11-19 NOTE — Progress Notes (Signed)
  Chronic Care Management   Note  11/19/2019 Name: DAVETTA OLLIFF MRN: [id number] DOB: 19-Apr-1940  PANTERA WINTERROWD is a 79 y.o. year old female who is a primary care patient of Myrlene Broker, MD. I reached out to [name] by phone today in response to a referral sent by Ms. Eloy End Colombe's PCP, Myrlene Broker, MD.   Ms. Coke was given information about Chronic Care Management services today including:  1. CCM service includes personalized support from designated clinical staff supervised by her physician, including individualized plan of care and coordination with other care providers 2. 24/7 contact phone numbers for assistance for urgent and routine care needs. 3. Service will only be billed when office clinical staff spend 20 minutes or more in a month to coordinate care. 4. Only one practitioner may furnish and bill the service in a calendar month. 5. The patient may stop CCM services at any time (effective at the end of the month) by phone call to the office staff.   Patient wishes to consider information provided and/or speak with a member of the care team before deciding about enrollment in care management services.   Follow up plan:   Lynnae January Upstream Scheduler

## 2019-12-08 ENCOUNTER — Other Ambulatory Visit: Payer: Self-pay | Admitting: Internal Medicine

## 2019-12-15 DIAGNOSIS — H5203 Hypermetropia, bilateral: Secondary | ICD-10-CM | POA: Diagnosis not present

## 2019-12-18 DIAGNOSIS — H524 Presbyopia: Secondary | ICD-10-CM | POA: Diagnosis not present

## 2020-01-15 ENCOUNTER — Encounter: Payer: Self-pay | Admitting: Internal Medicine

## 2020-01-15 ENCOUNTER — Other Ambulatory Visit: Payer: Self-pay

## 2020-01-15 ENCOUNTER — Ambulatory Visit (INDEPENDENT_AMBULATORY_CARE_PROVIDER_SITE_OTHER): Payer: Medicare Other | Admitting: Internal Medicine

## 2020-01-15 VITALS — BP 134/82 | HR 67 | Temp 98.7°F | Resp 16 | Ht 67.0 in | Wt 165.0 lb

## 2020-01-15 DIAGNOSIS — E785 Hyperlipidemia, unspecified: Secondary | ICD-10-CM | POA: Diagnosis not present

## 2020-01-15 DIAGNOSIS — Z0000 Encounter for general adult medical examination without abnormal findings: Secondary | ICD-10-CM

## 2020-01-15 DIAGNOSIS — N1832 Chronic kidney disease, stage 3b: Secondary | ICD-10-CM | POA: Diagnosis not present

## 2020-01-15 DIAGNOSIS — N183 Chronic kidney disease, stage 3 unspecified: Secondary | ICD-10-CM | POA: Insufficient documentation

## 2020-01-15 DIAGNOSIS — I10 Essential (primary) hypertension: Secondary | ICD-10-CM

## 2020-01-15 DIAGNOSIS — I493 Ventricular premature depolarization: Secondary | ICD-10-CM | POA: Diagnosis not present

## 2020-01-15 DIAGNOSIS — Z23 Encounter for immunization: Secondary | ICD-10-CM

## 2020-01-15 LAB — HEPATIC FUNCTION PANEL
ALT: 8 U/L (ref 0–35)
AST: 16 U/L (ref 0–37)
Albumin: 4.3 g/dL (ref 3.5–5.2)
Alkaline Phosphatase: 98 U/L (ref 39–117)
Bilirubin, Direct: 0 mg/dL (ref 0.0–0.3)
Total Bilirubin: 0.3 mg/dL (ref 0.2–1.2)
Total Protein: 6.6 g/dL (ref 6.0–8.3)

## 2020-01-15 LAB — BASIC METABOLIC PANEL
BUN: 13 mg/dL (ref 6–23)
CO2: 30 mEq/L (ref 19–32)
Calcium: 9 mg/dL (ref 8.4–10.5)
Chloride: 103 mEq/L (ref 96–112)
Creatinine, Ser: 1.05 mg/dL (ref 0.40–1.20)
GFR: 49.79 mL/min — ABNORMAL LOW (ref >60.00)
Glucose, Bld: 99 mg/dL (ref 70–99)
Potassium: 3.8 mEq/L (ref 3.5–5.1)
Sodium: 140 mEq/L (ref 135–145)

## 2020-01-15 LAB — CBC WITH DIFFERENTIAL/PLATELET
Basophils Absolute: 0.1 10*3/uL (ref 0.0–0.1)
Basophils Relative: 1.2 % (ref 0.0–3.0)
Eosinophils Absolute: 0.3 10*3/uL (ref 0.0–0.7)
Eosinophils Relative: 3 % (ref 0.0–5.0)
HCT: 39.3 % (ref 36.0–46.0)
Hemoglobin: 12.9 g/dL (ref 12.0–15.0)
Lymphocytes Relative: 31.4 % (ref 12.0–46.0)
Lymphs Abs: 2.6 10*3/uL (ref 0.7–4.0)
MCHC: 32.9 g/dL (ref 30.0–36.0)
MCV: 86.4 fl (ref 78.0–100.0)
Monocytes Absolute: 0.5 10*3/uL (ref 0.1–1.0)
Monocytes Relative: 6.3 % (ref 3.0–12.0)
Neutro Abs: 4.9 10*3/uL (ref 1.4–7.7)
Neutrophils Relative %: 58.1 % (ref 43.0–77.0)
Platelets: 333 10*3/uL (ref 150.0–400.0)
RBC: 4.55 Mil/uL (ref 3.87–5.11)
RDW: 13.6 % (ref 11.5–15.5)
WBC: 8.4 10*3/uL (ref 4.0–10.5)

## 2020-01-15 LAB — TSH: TSH: 2.57 u[IU]/mL (ref 0.35–4.50)

## 2020-01-15 LAB — LIPID PANEL
Cholesterol: 204 mg/dL — ABNORMAL HIGH (ref 0–200)
HDL: 53 mg/dL (ref >39.00)
NonHDL: 150.66
Total CHOL/HDL Ratio: 4
Triglycerides: 252 mg/dL — ABNORMAL HIGH (ref 0.0–149.0)
VLDL: 50.4 mg/dL — ABNORMAL HIGH (ref 0.0–40.0)

## 2020-01-15 LAB — LDL CHOLESTEROL, DIRECT: Direct LDL: 113 mg/dL

## 2020-01-15 NOTE — Progress Notes (Signed)
Subjective:  Patient ID: Kaylee Davis, female    DOB: 1941/01/28  Age: 79 y.o. MRN: [id number]  CC: Annual Exam, Hypertension, Hyperlipidemia, and Palpitations  This visit occurred during the SARS-CoV-2 public health emergency.  Safety protocols were in place, including screening questions prior to the visit, additional usage of staff PPE, and extensive cleaning of exam room while observing appropriate contact time as indicated for disinfecting solutions.   NEW TO ME  HPI Kaylee Davis presents for a CPX.  She has a history of PVCs and SVT.  She complains of chronic, stable palpitations.  She has not had to massage her carotid region recently.  She complains of ataxia and orthostatic dizziness.  It looks like she is no longer taking amlodipine but is taking a loop diuretic.  She denies chest pain, dyspnea on exertion, palpitations, edema, fatigue, near-syncope, or syncope.  Outpatient Medications Prior to Visit  Medication Sig Dispense Refill  . esomeprazole (NEXIUM) 20 MG capsule Take 1 capsule (20 mg total) by mouth every morning. 90 capsule 3  . PREMARIN vaginal cream Place 1 Applicatorful vaginally once a week.   5  . amLODipine (NORVASC) 10 MG tablet TAKE 1 TABLET BY MOUTH EVERY MORNING 90 tablet 1  . furosemide (LASIX) 40 MG tablet Take 1 tablet (40 mg total) by mouth daily. 90 tablet 3  . meloxicam (MOBIC) 15 MG tablet TAKE 1 TABLET BY MOUTH EVERY DAY 90 tablet 0   No facility-administered medications prior to visit.    ROS Review of Systems  Constitutional: Negative.  Negative for appetite change, diaphoresis, fatigue and unexpected weight change.  HENT: Negative.   Respiratory: Negative for cough, chest tightness, shortness of breath and wheezing.   Cardiovascular: Positive for palpitations. Negative for chest pain and leg swelling.  Gastrointestinal: Negative for abdominal pain, constipation, diarrhea, nausea and vomiting.  Endocrine: Negative.  Negative for  cold intolerance and heat intolerance.  Genitourinary: Negative.  Negative for decreased urine volume, difficulty urinating and urgency.  Musculoskeletal: Positive for gait problem. Negative for arthralgias, myalgias and neck pain.  Skin: Negative.  Negative for color change and pallor.  Neurological: Positive for dizziness and light-headedness. Negative for syncope, weakness and numbness.  Hematological: Negative for adenopathy. Does not bruise/bleed easily.  Psychiatric/Behavioral: Negative.     Objective:  BP 134/82   Pulse 67   Temp 98.7 F (37.1 C) (Oral)   Resp 16   Ht 5\' 7"  (1.702 m)   Wt 165 lb (74.8 kg)   LMP 03/21/1983   SpO2 97%   BMI 25.84 kg/m   BP Readings from Last 3 Encounters:  01/15/20 134/82  01/13/19 130/84  04/01/18 128/66    Wt Readings from Last 3 Encounters:  01/15/20 165 lb (74.8 kg)  01/13/19 163 lb (73.9 kg)  04/01/18 159 lb (72.1 kg)    Physical Exam Constitutional:      Appearance: Normal appearance.  HENT:     Nose: Nose normal.     Mouth/Throat:     Mouth: Mucous membranes are moist.  Eyes:     General: No scleral icterus.    Conjunctiva/sclera: Conjunctivae normal.  Cardiovascular:     Rate and Rhythm: Normal rate and regular rhythm.     Heart sounds: S1 normal and S2 normal. Murmur heard.  Systolic murmur is present with a grade of 1/6.  No diastolic murmur is present.  No gallop.      Comments: 1/6 SEM RUSB  EKG- Sinus bradycardia w  1st degree AV block, 57 bpm Otherwise normal EKG  Pulmonary:     Effort: Pulmonary effort is normal.     Breath sounds: No stridor. No wheezing, rhonchi or rales.  Abdominal:     General: Abdomen is flat.     Palpations: There is no mass.     Tenderness: There is no abdominal tenderness. There is no guarding.  Musculoskeletal:     Cervical back: Neck supple.     Right lower leg: No edema.     Left lower leg: No edema.  Lymphadenopathy:     Cervical: No cervical adenopathy.  Skin:     General: Skin is warm and dry.  Neurological:     General: No focal deficit present.     Mental Status: She is alert and oriented to person, place, and time. Mental status is at baseline.     Motor: No weakness.     Coordination: Coordination normal.     Gait: Gait normal.     Deep Tendon Reflexes: Reflexes normal.  Psychiatric:        Mood and Affect: Mood normal.        Behavior: Behavior normal.     Lab Results  Component Value Date   WBC 8.4 01/15/2020   HGB 12.9 01/15/2020   HCT 39.3 01/15/2020   PLT 333.0 01/15/2020   GLUCOSE 99 01/15/2020   CHOL 204 (H) 01/15/2020   TRIG 252.0 (H) 01/15/2020   HDL 53.00 01/15/2020   LDLDIRECT 113.0 01/15/2020   LDLCALC 134 (H) 01/13/2019   ALT 8 01/15/2020   AST 16 01/15/2020   NA 140 01/15/2020   K 3.8 01/15/2020   CL 103 01/15/2020   CREATININE 1.05 01/15/2020   BUN 13 01/15/2020   CO2 30 01/15/2020   TSH 2.57 01/15/2020   INR 0.94 03/01/2018   HGBA1C 5.5 02/16/2016    No results found.  Assessment & Plan:   Kaylee Davis was seen today for annual exam, hypertension, hyperlipidemia and palpitations.  Diagnoses and all orders for this visit:  Flu vaccine need -     Flu Vaccine QUAD High Dose(Fluad)  PVC (premature ventricular contraction)- She is asymptomatic with respect to this.  According to prescription refills she would have run out of amlodipine about 6 months ago.  This has been evaluated by cardiology.  Treatment is not indicated. -     Basic metabolic panel; Future -     TSH; Future -     EKG 12-Lead -     TSH -     Basic metabolic panel  Essential hypertension- She complains of orthostatic dizziness and her blood pressure is wellcontrolled.  I recommended that she stop taking amlodipine and the loop diuretic. -     CBC with Differential/Platelet; Future -     Basic metabolic panel; Future -     Basic metabolic panel -     CBC with Differential/Platelet  Routine health maintenance- Exam completed, labs  reviewed, vaccines reviewed and updated, cancer screenings are not indicated, patient education was given.  Hyperlipidemia with target LDL less than 130- Statin therapy is not indicated. -     Lipid panel; Future -     TSH; Future -     Hepatic function panel; Future -     Hepatic function panel -     TSH -     Lipid panel  Stage 3b chronic kidney disease (HCC)- Her blood pressure is adequately well controlled.  She will avoid  nephrotoxic agents.  Other orders -     Pneumococcal polysaccharide vaccine 23-valent greater than or equal to 2yo subcutaneous/IM -     LDL cholesterol, direct   I have discontinued Kaylee Davis's amLODipine, furosemide, and meloxicam. I am also having her maintain her esomeprazole and Premarin.  No orders of the defined types were placed in this encounter.  In addition to time spent on CPE, I spent 50 minutes in preparing to see the patient by review of recent labs, imaging and procedures, obtaining and reviewing separately obtained history, communicating with the patient and family or caregiver, ordering medications, tests or procedures, and documenting clinical information in the EHR including the differential Dx, treatment, and any further evaluation and other management of 1. PVC (premature ventricular contraction) 2. Essential hypertension 3. Hyperlipidemia with target LDL less than 130 4. Stage 3b chronic kidney disease (HCC)     Follow-up: Return in about 6 months (around 07/15/2020).  Sanda Linger, MD

## 2020-01-15 NOTE — Patient Instructions (Signed)
Premature Ventricular Contraction ° °A premature ventricular contraction (PVC) is a common kind of irregular heartbeat (arrhythmia). These contractions are extra heartbeats that start in the ventricles of the heart and occur too early in the normal sequence. During the PVC, the heart's normal electrical pathway is not used, so the beat is shorter and less effective. In most cases, these contractions come and go and do not require treatment. °What are the causes? °Common causes of the condition include: °· Smoking. °· Drinking alcohol. °· Certain medicines. °· Some illegal drugs. °· Stress. °· Caffeine. °Certain medical conditions can also cause PVCs: °· Heart failure. °· Heart attack, or coronary artery disease. °· Heart valve problems. °· Changes in minerals in the blood (electrolytes). °· Low blood oxygen levels or high carbon dioxide levels. °In many cases, the cause of this condition is not known. °What are the signs or symptoms? °The main symptom of this condition is fast or skipped heartbeats (palpitations). Other symptoms include: °· Chest pain. °· Shortness of breath. °· Feeling tired. °· Dizziness. °· Difficulty exercising. °In some cases, there are no symptoms. °How is this diagnosed? °This condition may be diagnosed based on: °· Your medical history. °· A physical exam. During the exam, the health care provider will check for irregular heartbeats. °· Tests, such as: °? An ECG (electrocardiogram) to monitor the electrical activity of your heart. °? An ambulatory cardiac monitor. This device records your heartbeats for 24 hours or more. °? Stress tests to see how exercise affects your heart rhythm and blood supply. °? An echocardiogram. This test uses sound waves (ultrasound) to produce an image of your heart. °? An electrophysiology study (EPS). This test checks for electrical problems in your heart. °How is this treated? °Treatment for this condition depends on any underlying conditions, the type of PVCs  that you are having, and how much the symptoms are interfering with your daily life. °Possible treatments include: °· Avoiding things that cause premature contractions (triggers). These include caffeine and alcohol. °· Taking medicines if symptoms are severe or if the extra heartbeats are frequent. °· Getting treatment for underlying conditions that cause PVCs. °· Having an implantable cardioverter defibrillator (ICD), if you are at risk for a serious arrhythmia. The ICD is a small device that is inserted into your chest to monitor your heartbeat. When it senses an irregular heartbeat, it sends a shock to bring the heartbeat back to normal. °· Having a procedure to destroy the portion of the heart tissue that sends out abnormal signals (catheter ablation). °In some cases, no treatment is required. °Follow these instructions at home: °Lifestyle °· Do not use any products that contain nicotine or tobacco, such as cigarettes, e-cigarettes, and chewing tobacco. If you need help quitting, ask your health care provider. °· Do not use illegal drugs. °· Exercise regularly. Ask your health care provider what type of exercise is safe for you. °· Try to get at least 7-9 hours of sleep each night, or as much as recommended by your health care provider. °· Find healthy ways to manage stress. Avoid stressful situations when possible. °Alcohol use °· Do not drink alcohol if: °? Your health care provider tells you not to drink. °? You are pregnant, may be pregnant, or are planning to become pregnant. °? Alcohol triggers your episodes. °· If you drink alcohol: °? Limit how much you use to: °§ 0-1 drink a day for women. °§ 0-2 drinks a day for men. °· Be aware of how much   alcohol is in your drink. In the [place] one drink equals one 12 oz bottle of beer (355 mL), one 5 oz glass of wine (148 mL), or one 1½ oz glass of hard liquor (44 mL). °General instructions °· Take over-the-counter and prescription medicines only as told by your  health care provider. °· If caffeine triggers episodes of PVC, do not eat, drink, or use anything with caffeine in it. °· Keep all follow-up visits as told by your health care provider. This is important. °Contact a health care provider if you: °· Feel palpitations. °Get help right away if you: °· Have chest pain. °· Have shortness of breath. °· Have sweating for no reason. °· Have nausea and vomiting. °· Become light-headed or you faint. °Summary °· A premature ventricular contraction (PVC) is a common kind of irregular heartbeat (arrhythmia). °· In most cases, these contractions come and go and do not require treatment. °· You may need to wear an ambulatory cardiac monitor. This records your heartbeats for 24 hours or more. °· Treatment depends on any underlying conditions, the type of PVCs that you are having, and how much the symptoms are interfering with your daily life. °This information is not intended to replace advice given to you by your health care provider. Make sure you discuss any questions you have with your health care provider. °Document Revised: 11/29/2017 Document Reviewed: 11/29/2017 °Elsevier Patient Education © 2020 [employer] ° °

## 2020-01-16 ENCOUNTER — Encounter: Payer: Self-pay | Admitting: Internal Medicine

## 2020-03-26 ENCOUNTER — Telehealth: Payer: Self-pay | Admitting: Internal Medicine

## 2020-03-26 NOTE — Telephone Encounter (Signed)
Furosemide was d/c'd?

## 2020-04-02 NOTE — Telephone Encounter (Signed)
Pt notified that diuretic was d/c'd at OV on 01/15/20 b/c of the statement below. Essential hypertension- She complains of orthostatic dizziness and her blood pressure is wellcontrolled.  I recommended that she stop taking amlodipine and the loop diuretic. Pt states that she misunderstood & has continued to take the medications above & continues to experience dizziness when standing up from a bent position.  Pt advised to follow Dr Yetta Barre recommendations from 01/15/20 visit & record her Bps along with symptoms that she may be experiencing.  Pt advised to call to make OV if persistent hypo/hypertension noted.  Pt verb understanding.

## 2020-04-02 NOTE — Telephone Encounter (Signed)
Patient wondering why the medication was denied, and if she needs to come in the office to get it refilled.

## 2020-04-12 ENCOUNTER — Other Ambulatory Visit: Payer: Self-pay

## 2020-04-13 ENCOUNTER — Encounter: Payer: Self-pay | Admitting: Internal Medicine

## 2020-04-13 ENCOUNTER — Ambulatory Visit (INDEPENDENT_AMBULATORY_CARE_PROVIDER_SITE_OTHER): Payer: Medicare Other | Admitting: Internal Medicine

## 2020-04-13 DIAGNOSIS — I10 Essential (primary) hypertension: Secondary | ICD-10-CM | POA: Diagnosis not present

## 2020-04-13 MED ORDER — AMLODIPINE BESYLATE 5 MG PO TABS: 5.0000 mg | ORAL_TABLET | Freq: Every day | ORAL | 3 refills | Status: DC

## 2020-04-13 NOTE — Patient Instructions (Signed)
We will send in the amlodipine 5 mg to restart daily.

## 2020-04-13 NOTE — Progress Notes (Signed)
   Subjective:   Patient ID: Kaylee Davis, female    DOB: June 19, 1940, 80 y.o.   MRN: [id number]  HPI The patient is an 80 YO female coming in for BP concerns. She is having higher readings than normal in the past few months. She had her medications stopped at recent visit Oct 2021. She used to take amlodipine and furosemide prn. She was advised to stop due to some lightheadedness. She felt that this was related to allergies and has had rare episodes of this for year and it is not significantly changed off the BP meds. She denies headaches or chest pains.   Review of Systems  Constitutional: Negative.   HENT: Negative.   Eyes: Negative.   Respiratory: Negative for cough, chest tightness and shortness of breath.   Cardiovascular: Negative for chest pain, palpitations and leg swelling.  Gastrointestinal: Negative for abdominal distention, abdominal pain, constipation, diarrhea, nausea and vomiting.  Musculoskeletal: Negative.   Skin: Negative.   Neurological: Negative.   Psychiatric/Behavioral: Negative.     Objective:  Physical Exam Constitutional:      Appearance: She is well-developed and well-nourished.  HENT:     Head: Normocephalic and atraumatic.  Eyes:     Extraocular Movements: EOM normal.  Cardiovascular:     Rate and Rhythm: Normal rate and regular rhythm.  Pulmonary:     Effort: Pulmonary effort is normal. No respiratory distress.     Breath sounds: Normal breath sounds. No wheezing or rales.  Abdominal:     General: Bowel sounds are normal. There is no distension.     Palpations: Abdomen is soft.     Tenderness: There is no abdominal tenderness. There is no rebound.  Musculoskeletal:        General: No edema.     Cervical back: Normal range of motion.  Skin:    General: Skin is warm and dry.  Neurological:     Mental Status: She is alert and oriented to person, place, and time.     Coordination: Coordination normal.  Psychiatric:        Mood and Affect:  Mood and affect normal.     Vitals:   04/13/20 1437  BP: (!) 142/80  Pulse: 64  Resp: 18  Temp: 98.5 F (36.9 C)  TempSrc: Oral  SpO2: 97%  Weight: 156 lb 12.8 oz (71.1 kg)  Height: 5\' 2"  (1.575 m)    This visit occurred during the SARS-CoV-2 public health emergency.  Safety protocols were in place, including screening questions prior to the visit, additional usage of staff PPE, and extensive cleaning of exam room while observing appropriate contact time as indicated for disinfecting solutions.   Assessment & Plan:

## 2020-04-16 NOTE — Assessment & Plan Note (Signed)
Restart amlodipine 5 mg daily to help BP to meet goal of 130/80. She is mildly above goal today.

## 2020-06-03 ENCOUNTER — Telehealth: Payer: Self-pay | Admitting: Internal Medicine

## 2020-06-03 NOTE — Telephone Encounter (Signed)
LVM for pt to rtn my call to schedule AWV with [facility]. Please schedule appt if pt calls the office.  

## 2020-06-25 DIAGNOSIS — H40023 Open angle with borderline findings, high risk, bilateral: Secondary | ICD-10-CM | POA: Diagnosis not present

## 2020-06-25 DIAGNOSIS — H40053 Ocular hypertension, bilateral: Secondary | ICD-10-CM | POA: Diagnosis not present

## 2020-08-16 ENCOUNTER — Encounter (HOSPITAL_COMMUNITY): Payer: Self-pay | Admitting: Emergency Medicine

## 2020-08-16 ENCOUNTER — Ambulatory Visit (HOSPITAL_COMMUNITY)
Admission: EM | Admit: 2020-08-16 | Discharge: 2020-08-16 | Disposition: A | Discharge status: Home / Self Care | Payer: Medicare Other | Attending: Student | Admitting: Student

## 2020-08-16 ENCOUNTER — Other Ambulatory Visit: Payer: Self-pay

## 2020-08-16 DIAGNOSIS — F4321 Adjustment disorder with depressed mood: Secondary | ICD-10-CM

## 2020-08-16 MED ORDER — HYDROXYZINE HCL 25 MG PO TABS: 25.0000 mg | ORAL_TABLET | Freq: Three times a day (TID) | ORAL | 0 refills | Status: DC | PRN

## 2020-08-16 NOTE — Discharge Instructions (Addendum)
-  Hydroxyzine up to 3 times daily for anxiety and insomnia.  This can cause drowsiness. -Follow-up with PCP tomorrow to discuss further medication management.  -If you experience any suicidal or homicidal ideation head straight to the ED or call 911.

## 2020-08-16 NOTE — ED Triage Notes (Signed)
Pt presents today with husband. She reports that her son passed away 07-13-2022 unexpectedly and she cannot stop crying and has not slept. She does report calling PCP yesterday and spoke to nurse who told her to come to Urgent Care.

## 2020-08-16 NOTE — ED Provider Notes (Signed)
[facility]    CSN: [id number] Arrival date & time: 08/16/20  1136      History   Chief Complaint Chief Complaint  Patient presents with  . Anxiety  . Insomnia    HPI Kaylee Davis is a 80 y.o. female presenting with anxiety and grief following unexpected death of son.  Medical history arthritis, GERD, migraines, episodic tachycardia, TMJ, PVC, CKD.  Patient states that her son passed away 2 days ago and since then she has had extreme anger and sadness.  She is having trouble sleeping due to these emotions.  Tried to call her primary care but they are not open due to holiday.  Denies suicidal and homicidal ideation.  States she is feeling well physically, denies chest pain, shortness of breath, weakness, dizziness, headaches.  HPI  Past Medical History:  Diagnosis Date  . Arthritis    OA BOTH KNEES; OCCAS PAIN LEFT SHOULDER - HX OF ROTATOR CUFF PROBLEM  . Dyspepsia   . GERD (gastroesophageal reflux disease)   . GI bleed    GI BLEED FROM DUODENAL ULCER--ABOUT 17 YRS AGO - GIVEN TRANSFUSIONS  . History of blood transfusion    1990's  . History of frequent urinary tract infections   . History of PSVT (paroxysmal supraventricular tachycardia)    can control by doing a valsalva. Triggered by elevated heart rate  . Hypertension   . Imbalance   . Migraine headache    much reduced frequency  . Tachycardia    Episodic  . Tingling    left foot   . TMJ (temporomandibular joint disorder)    PAST HX GRINDING TEETH --BUT NO LONGER A PROBLEM  . Wears glasses     Patient Active Problem List   Diagnosis Date Noted  . PVC (premature ventricular contraction) 01/15/2020  . Hyperlipidemia with target LDL less than 130 01/15/2020  . Flu vaccine need 01/15/2020  . Stage 3b chronic kidney disease (HCC) 01/15/2020  . OA (osteoarthritis) of hip 03/06/2018  . OA (osteoarthritis) of knee 05/28/2015  . Routine health maintenance 02/03/2011  . Essential hypertension  02/26/2007  . PSVT 02/26/2007  . GERD (gastroesophageal reflux disease) 02/26/2007    Past Surgical History:  Procedure Laterality Date  . ABDOMINAL HYSTERECTOMY  1985   TAH/BSO--for ?fibroids  . APPENDECTOMY    . ARTHROPLASTY     left Knee   . cyst removed      from mouth  . KNEE ARTHROSCOPY Left 10/2005  . LAPAROSCOPIC APPENDECTOMY  02/2000  . REPLACEMENT TOTAL KNEE Right   . TONSILLECTOMY    . TOTAL HIP ARTHROPLASTY Right 03/06/2018   Procedure: TOTAL HIP ARTHROPLASTY ANTERIOR APPROACH;  Surgeon: Ollen Gross, MD;  Location: [facility];  Service: Orthopedics;  Laterality: Right;   . 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 TOTAL KNEE ARTHROPLASTY Right 05/28/2015   Procedure: RIGHT TOTAL KNEE ARTHROPLASTY;  Surgeon: Ollen Gross, MD;  Location: [facility];  Service: Orthopedics;  Laterality: Right;  . WISDOM TOOTH EXTRACTION  2008    OB History    Gravida  2   Para  2   Term  2   Preterm      AB      Living  2     SAB      IAB      Ectopic  Multiple      Live Births               Home Medications    Prior to Admission medications   Medication Sig Start Date End Date Taking? Authorizing Provider  amLODipine (NORVASC) 5 MG tablet Take 1 tablet (5 mg total) by mouth daily. 04/13/20  Yes Myrlene Broker, MD  PREMARIN vaginal cream Place 1 Applicatorful vaginally once a week.  11/18/17  Yes [provider]  esomeprazole (NEXIUM) 20 MG capsule Take 1 capsule (20 mg total) by mouth every morning. 05/20/15 08/16/20 Yes Myrlene Broker, MD  hydrOXYzine (ATARAX/VISTARIL) 25 MG tablet Take 1 tablet (25 mg total) by mouth every 8 (eight) hours as needed. 08/16/20   Rhys Martini, PA-C    Family History Family History  Problem Relation Age of Onset  . Cancer Neg Hx        Breast/Colon  . Diabetes Neg  Hx   . Heart disease Neg Hx        CAD/MI    Social History Social History   Tobacco Use  . Smoking status: Never Smoker  . Smokeless tobacco: Never Used  Vaping Use  . Vaping Use: Never used  Substance Use Topics  . Alcohol use: No    Alcohol/week: 0.0 standard drinks    Comment: no   . Drug use: No     Allergies   Nitrofuran derivatives, Other, Penicillins, and Sulfonamide derivatives   Review of Systems Review of Systems  Psychiatric/Behavioral: Positive for sleep disturbance. The patient is nervous/anxious.   All other systems reviewed and are negative.    Physical Exam Triage Vital Signs ED Triage Vitals  Enc Vitals Group     BP      Pulse      Resp      Temp      Temp src      SpO2      Weight      Height      Head Circumference      Peak Flow      Pain Score      Pain Loc      Pain Edu?      Excl. in GC?    No data found.  Updated Vital Signs BP (!) 163/90 (BP Location: Right Arm)   Pulse 97   Temp 98.4 F (36.9 C) (Oral)   Resp 18   LMP 03/21/1983   SpO2 98%   Visual Acuity Right Eye Distance:   Left Eye Distance:   Bilateral Distance:    Right Eye Near:   Left Eye Near:    Bilateral Near:     Physical Exam Vitals reviewed.  Constitutional:      General: She is not in acute distress.    Appearance: Normal appearance. She is not ill-appearing or diaphoretic.  HENT:     Head: Normocephalic and atraumatic.  Cardiovascular:     Rate and Rhythm: Normal rate and regular rhythm.     Heart sounds: Normal heart sounds.  Pulmonary:     Effort: Pulmonary effort is normal.     Breath sounds: Normal breath sounds.  Skin:    General: Skin is warm.  Neurological:     General: No focal deficit present.     Mental Status: She is alert and oriented to person, place, and time.  Psychiatric:        Mood and Affect: Mood normal. Affect is tearful.  Behavior: Behavior normal.        Thought Content: Thought content normal.         Judgment: Judgment normal.      UC Treatments / Results  Labs (all labs ordered are listed, but only abnormal results are displayed) Labs Reviewed - No data to display  EKG   Radiology No results found.  Procedures Procedures (including critical care time)  Medications Ordered in UC Medications - No data to display  Initial Impression / Assessment and Plan / UC Course  I have reviewed the triage vital signs and the nursing notes.  Pertinent labs & imaging results that were available during my care of the patient were reviewed by me and considered in my medical decision making (see chart for details).     This patient is an 80 year old female presenting with grief reaction following unexpected passing of son.  Denies homicidal and suicidal ideation.  Trial of hydroxyzine as below.  Follow-up with primary care in 1 business day.  ED return precautions discussed.  Final Clinical Impressions(s) / UC Diagnoses   Final diagnoses:  Grief reaction     Discharge Instructions     -Hydroxyzine up to 3 times daily for anxiety and insomnia.  This can cause drowsiness. -Follow-up with PCP tomorrow to discuss further medication management.  -If you experience any suicidal or homicidal ideation head straight to the ED or call 911.    ED Prescriptions    Medication Sig Dispense Auth. Provider   hydrOXYzine (ATARAX/VISTARIL) 25 MG tablet  (Status: Discontinued) Take 1 tablet (25 mg total) by mouth every 8 (eight) hours as needed. 21 tablet Rhys Martini, PA-C   hydrOXYzine (ATARAX/VISTARIL) 25 MG tablet Take 1 tablet (25 mg total) by mouth every 8 (eight) hours as needed. 21 tablet Rhys Martini, PA-C     PDMP not reviewed this encounter.   Rhys Martini, PA-C 08/16/20 1303

## 2020-08-17 ENCOUNTER — Telehealth: Payer: Self-pay | Admitting: Internal Medicine

## 2020-08-17 NOTE — Telephone Encounter (Signed)
It looks like urgent care prescribed hydroxyzine did this help?

## 2020-08-17 NOTE — Telephone Encounter (Signed)
See below

## 2020-08-17 NOTE — Telephone Encounter (Signed)
[facility] FYI:  ---Caller states his son passed away suddenly yesterday He is wondering if a medication can be called in for his wife to calm her down, she is in a terrible state and very upset. She is crying and cannot stop. Her heart is racing and she sees a HR is still racing.  Advised to go to ED now

## 2020-08-18 NOTE — Telephone Encounter (Signed)
LVM asking the patient to give our office a call to discuss. Office number was provided.

## 2020-08-23 ENCOUNTER — Other Ambulatory Visit: Payer: Self-pay

## 2020-08-23 ENCOUNTER — Ambulatory Visit (INDEPENDENT_AMBULATORY_CARE_PROVIDER_SITE_OTHER): Payer: Medicare Other | Admitting: Internal Medicine

## 2020-08-23 ENCOUNTER — Encounter: Payer: Self-pay | Admitting: Internal Medicine

## 2020-08-23 VITALS — BP 122/84 | HR 70 | Temp 98.5°F | Resp 18 | Ht 62.0 in | Wt 163.0 lb

## 2020-08-23 DIAGNOSIS — E2839 Other primary ovarian failure: Secondary | ICD-10-CM

## 2020-08-23 DIAGNOSIS — R350 Frequency of micturition: Secondary | ICD-10-CM | POA: Diagnosis not present

## 2020-08-23 DIAGNOSIS — F4323 Adjustment disorder with mixed anxiety and depressed mood: Secondary | ICD-10-CM

## 2020-08-23 LAB — POCT URINALYSIS DIPSTICK
Bilirubin, UA: NEGATIVE
Blood, UA: NEGATIVE
Glucose, UA: NEGATIVE
Ketones, UA: NEGATIVE
Leukocytes, UA: NEGATIVE
Nitrite, UA: NEGATIVE
Protein, UA: NEGATIVE
Spec Grav, UA: 1.015 (ref 1.010–1.025)
Urobilinogen, UA: 0.2 E.U./dL
pH, UA: 6 (ref 5.0–8.0)

## 2020-08-23 MED ORDER — HYDROXYZINE HCL 25 MG PO TABS: 25.0000 mg | ORAL_TABLET | Freq: Two times a day (BID) | ORAL | 2 refills | Status: DC | PRN

## 2020-08-23 NOTE — Patient Instructions (Signed)
We will check the urine.  We will get you in for counseling and have refilled the hydroxyzine which is safe for this and is not addictive.

## 2020-08-23 NOTE — Progress Notes (Signed)
   Subjective:   Patient ID: Kaylee Davis, female    DOB: 1941-02-07, 80 y.o.   MRN: [id number]  HPI The patient is an 80 YO female coming in for grief following loss of son less than 2 weeks ago. Went to urgent care due to palpitations and severe anxiety. She was given rx for hydroxyzine which she is taking twice a day. This seems to help and she is able to sleep some. She is still struggling a lot with this loss. Denies SI/HI.  Review of Systems  Constitutional: Negative.   HENT: Negative.   Eyes: Negative.   Respiratory: Negative for cough, chest tightness and shortness of breath.   Cardiovascular: Negative for chest pain, palpitations and leg swelling.  Gastrointestinal: Negative for abdominal distention, abdominal pain, constipation, diarrhea, nausea and vomiting.  Musculoskeletal: Negative.   Skin: Negative.   Neurological: Negative.   Psychiatric/Behavioral: Positive for decreased concentration, dysphoric mood and sleep disturbance. The patient is nervous/anxious.     Objective:  Physical Exam Constitutional:      Appearance: She is well-developed.  HENT:     Head: Normocephalic and atraumatic.  Cardiovascular:     Rate and Rhythm: Normal rate and regular rhythm.  Pulmonary:     Effort: Pulmonary effort is normal. No respiratory distress.     Breath sounds: Normal breath sounds. No wheezing or rales.  Abdominal:     General: Bowel sounds are normal. There is no distension.     Palpations: Abdomen is soft.     Tenderness: There is no abdominal tenderness. There is no rebound.  Musculoskeletal:     Cervical back: Normal range of motion.  Skin:    General: Skin is warm and dry.  Neurological:     Mental Status: She is alert and oriented to person, place, and time.     Coordination: Coordination normal.  Psychiatric:     Comments: Mood appropriate to situation     Vitals:   08/23/20 1515  BP: 122/84  Pulse: 70  Resp: 18  Temp: 98.5 F (36.9 C)  TempSrc:  Oral  SpO2: 98%  Weight: 163 lb (73.9 kg)  Height: 5\' 2"  (1.575 m)    This visit occurred during the SARS-CoV-2 public health emergency.  Safety protocols were in place, including screening questions prior to the visit, additional usage of staff PPE, and extensive cleaning of exam room while observing appropriate contact time as indicated for disinfecting solutions.   Assessment & Plan:

## 2020-08-24 DIAGNOSIS — R350 Frequency of micturition: Secondary | ICD-10-CM | POA: Insufficient documentation

## 2020-08-24 DIAGNOSIS — F4323 Adjustment disorder with mixed anxiety and depressed mood: Secondary | ICD-10-CM | POA: Insufficient documentation

## 2020-08-24 NOTE — Assessment & Plan Note (Signed)
Refill hydroxyzine and we did discuss how this is not addictive and she can stop anytime. She would like counseling so referral done for that during visit. She is dealing with loss of son which is very difficult to process.

## 2020-08-24 NOTE — Assessment & Plan Note (Signed)
Unsure if she has infection with increased urination did POC U/A not consistent with infection advised to monitor.

## 2020-09-01 DIAGNOSIS — N952 Postmenopausal atrophic vaginitis: Secondary | ICD-10-CM | POA: Diagnosis not present

## 2020-09-01 DIAGNOSIS — N3020 Other chronic cystitis without hematuria: Secondary | ICD-10-CM | POA: Diagnosis not present

## 2020-11-24 ENCOUNTER — Telehealth: Payer: Self-pay | Admitting: Internal Medicine

## 2020-11-24 NOTE — Telephone Encounter (Signed)
Left message for patient to call me back at [phone] to schedule Medicare Annual Wellness Visit   Last AWV  01/13/19  Please schedule at anytime with [facility] Advisor if patient calls the office back.    40 Minutes appointment   Any questions, please call me at [phone]  

## 2020-11-25 DIAGNOSIS — H40023 Open angle with borderline findings, high risk, bilateral: Secondary | ICD-10-CM | POA: Diagnosis not present

## 2020-11-25 DIAGNOSIS — H524 Presbyopia: Secondary | ICD-10-CM | POA: Diagnosis not present

## 2020-12-02 ENCOUNTER — Other Ambulatory Visit: Payer: Self-pay | Admitting: Internal Medicine

## 2021-01-24 ENCOUNTER — Other Ambulatory Visit: Payer: Self-pay | Admitting: Internal Medicine

## 2021-03-07 ENCOUNTER — Other Ambulatory Visit: Payer: Self-pay

## 2021-03-07 ENCOUNTER — Encounter: Payer: Self-pay | Admitting: Internal Medicine

## 2021-03-07 ENCOUNTER — Ambulatory Visit (INDEPENDENT_AMBULATORY_CARE_PROVIDER_SITE_OTHER): Payer: Medicare Other | Admitting: Internal Medicine

## 2021-03-07 VITALS — BP 130/78 | HR 76 | Resp 18 | Ht 62.0 in | Wt 155.0 lb

## 2021-03-07 DIAGNOSIS — N1831 Chronic kidney disease, stage 3a: Secondary | ICD-10-CM

## 2021-03-07 DIAGNOSIS — E785 Hyperlipidemia, unspecified: Secondary | ICD-10-CM | POA: Diagnosis not present

## 2021-03-07 DIAGNOSIS — F4323 Adjustment disorder with mixed anxiety and depressed mood: Secondary | ICD-10-CM | POA: Diagnosis not present

## 2021-03-07 DIAGNOSIS — Z0000 Encounter for general adult medical examination without abnormal findings: Secondary | ICD-10-CM

## 2021-03-07 DIAGNOSIS — I10 Essential (primary) hypertension: Secondary | ICD-10-CM | POA: Diagnosis not present

## 2021-03-07 MED ORDER — AMLODIPINE BESYLATE 5 MG PO TABS: 5.0000 mg | ORAL_TABLET | Freq: Every day | ORAL | 3 refills | Status: DC

## 2021-03-07 MED ORDER — HYDROXYZINE HCL 25 MG PO TABS: 25.0000 mg | ORAL_TABLET | Freq: Two times a day (BID) | ORAL | 2 refills | Status: DC | PRN

## 2021-03-07 NOTE — Patient Instructions (Signed)
We have sent in the refills.  

## 2021-03-07 NOTE — Progress Notes (Signed)
Subjective:   Patient ID: Kaylee Davis, female    DOB: 1940/11/22, 80 y.o.   MRN: [id number]  HPI Here for medicare wellness and physical, no new complaints. Please see A/P for status and treatment of chronic medical problems.   Diet: heart healthy Physical activity: sedentary Depression/mood screen: negative Hearing: intact to whispered voice Visual acuity: grossly normal, performs annual eye exam  ADLs: capable Fall risk: none Home safety: good Cognitive evaluation: intact to orientation, naming, recall and repetition EOL planning: adv directives discussed  Flowsheet Row Office Visit from 03/07/2021 in [facility] Total Score 0        Fall Risk 03/07/2018 01/13/2019 01/15/2020 08/16/2020 03/07/2021  Falls in the past year? - 0 0 - 0  Was there an injury with Fall? - - - - 0  Fall Risk Category Calculator - - - - 0  Fall Risk Category - - - - Low  Patient Fall Risk Level High fall risk Low fall risk - Low fall risk -    I have personally reviewed and have noted 1. The patient's medical and social history - reviewed today no changes 2. Their use of alcohol, tobacco or illicit drugs 3. Their current medications and supplements 4. The patient's functional ability including ADL's, fall risks, home safety risks and hearing or visual impairment. 5. Diet and physical activities 6. Evidence for depression or mood disorders 7. Care team reviewed and updated 8.  The patient is not on an opioid pain medication.  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 Triggered by elevated heart rate   Hypertension    Imbalance    Migraine headache    much reduced frequency   Tachycardia    Episodic   Tingling    left foot    TMJ (temporomandibular joint disorder)    PAST HX GRINDING TEETH --BUT NO LONGER A PROBLEM   Wears glasses    Past Surgical History:  Procedure Laterality Date   ABDOMINAL HYSTERECTOMY  1985   TAH/BSO--for ?fibroids   APPENDECTOMY     ARTHROPLASTY     left Knee    cyst removed      from mouth   KNEE ARTHROSCOPY Left 10/2005   LAPAROSCOPIC APPENDECTOMY  02/2000   REPLACEMENT TOTAL KNEE Right    TONSILLECTOMY     TOTAL HIP ARTHROPLASTY Right 03/06/2018   Procedure: TOTAL HIP ARTHROPLASTY ANTERIOR APPROACH;  Surgeon: Ollen Gross, MD;  Location: [facility];  Service: Orthopedics;  Laterality: Right;    TOTAL KNEE ARTHROPLASTY Left 07/29/2012   Procedure: LEFT TOTAL KNEE ARTHROPLASTY;  Surgeon: Loanne Drilling, MD;  Location: [facility];  Service: Orthopedics;  Laterality: Left;   TOTAL KNEE ARTHROPLASTY Right 05/28/2015   Procedure: RIGHT TOTAL KNEE ARTHROPLASTY;  Surgeon: Ollen Gross, MD;  Location: [facility];  Service: Orthopedics;  Laterality: Right;  WISDOM TOOTH EXTRACTION  2008   Family History  Problem Relation Age of Onset   Cancer Neg Hx        Breast/Colon   Diabetes Neg Hx    Heart disease Neg Hx        CAD/MI   Review of Systems  Constitutional: Negative.   HENT: Negative.    Eyes: Negative.   Respiratory:  Negative for cough, chest tightness and shortness of breath.   Cardiovascular:  Negative for chest pain, palpitations and leg swelling.  Gastrointestinal:  Negative for abdominal distention, abdominal pain, constipation, diarrhea, nausea and vomiting.  Musculoskeletal: Negative.   Skin: Negative.   Neurological: Negative.    Psychiatric/Behavioral: Negative.     Objective:  Physical Exam Constitutional:      Appearance: She is well-developed.  HENT:     Head: Normocephalic and atraumatic.  Cardiovascular:     Rate and Rhythm: Normal rate and regular rhythm.  Pulmonary:     Effort: Pulmonary effort is normal. No respiratory distress.     Breath sounds: Normal breath sounds. No wheezing or rales.  Abdominal:     General: Bowel sounds are normal. There is no distension.     Palpations: Abdomen is soft.     Tenderness: There is no abdominal tenderness. There is no rebound.  Musculoskeletal:     Cervical back: Normal range of motion.  Skin:    General: Skin is warm and dry.  Neurological:     Mental Status: She is alert and oriented to person, place, and time.     Coordination: Coordination abnormal.     Comments: walker    Vitals:   03/07/21 1546  BP: 130/78  Pulse: 76  Resp: 18  SpO2: 97%  Weight: 155 lb (70.3 kg)  Height: 5\' 2"  (1.575 m)    This visit occurred during the SARS-CoV-2 public health emergency.  Safety protocols were in place, including screening questions prior to the visit, additional usage of staff PPE, and extensive cleaning of exam room while observing appropriate contact time as indicated for disinfecting solutions.   Assessment & Plan:

## 2021-03-08 LAB — COMPREHENSIVE METABOLIC PANEL
ALT: 8 U/L (ref 0–35)
AST: 20 U/L (ref 0–37)
Albumin: 4.1 g/dL (ref 3.5–5.2)
Alkaline Phosphatase: 77 U/L (ref 39–117)
BUN: 14 mg/dL (ref 6–23)
CO2: 30 mEq/L (ref 19–32)
Calcium: 9.6 mg/dL (ref 8.4–10.5)
Chloride: 101 mEq/L (ref 96–112)
Creatinine, Ser: 0.85 mg/dL (ref 0.40–1.20)
GFR: 63.65 mL/min (ref >60.00)
Glucose, Bld: 84 mg/dL (ref 70–99)
Potassium: 4 mEq/L (ref 3.5–5.1)
Sodium: 139 mEq/L (ref 135–145)
Total Bilirubin: 0.3 mg/dL (ref 0.2–1.2)
Total Protein: 6.7 g/dL (ref 6.0–8.3)

## 2021-03-08 LAB — LDL CHOLESTEROL, DIRECT: Direct LDL: 120 mg/dL

## 2021-03-08 LAB — HEMOGLOBIN A1C: Hgb A1c MFr Bld: 5.9 % (ref 4.6–6.5)

## 2021-03-08 LAB — CBC
HCT: 40.3 % (ref 36.0–46.0)
Hemoglobin: 13.3 g/dL (ref 12.0–15.0)
MCHC: 32.9 g/dL (ref 30.0–36.0)
MCV: 87 fl (ref 78.0–100.0)
Platelets: 342 10*3/uL (ref 150.0–400.0)
RBC: 4.64 Mil/uL (ref 3.87–5.11)
RDW: 14.2 % (ref 11.5–15.5)
WBC: 9.1 10*3/uL (ref 4.0–10.5)

## 2021-03-08 LAB — LIPID PANEL
Cholesterol: 196 mg/dL (ref 0–200)
HDL: 50.9 mg/dL (ref >39.00)
NonHDL: 144.95
Total CHOL/HDL Ratio: 4
Triglycerides: 230 mg/dL — ABNORMAL HIGH (ref 0.0–149.0)
VLDL: 46 mg/dL — ABNORMAL HIGH (ref 0.0–40.0)

## 2021-03-08 LAB — VITAMIN B12: Vitamin B-12: 139 pg/mL — ABNORMAL LOW (ref 211–911)

## 2021-03-08 LAB — TSH: TSH: 2.75 u[IU]/mL (ref 0.35–5.50)

## 2021-03-08 LAB — VITAMIN D 25 HYDROXY (VIT D DEFICIENCY, FRACTURES): VITD: 13.41 ng/mL — ABNORMAL LOW (ref 30.00–100.00)

## 2021-03-08 NOTE — Assessment & Plan Note (Signed)
Uses hydroxyzine prn and refilled today. She has been going through a very rough emotional year but feels she is coping okay.

## 2021-03-08 NOTE — Assessment & Plan Note (Signed)
Checking CMP for stability. No DM and BP at goal.

## 2021-03-08 NOTE — Assessment & Plan Note (Signed)
Flu shot up to date. Covid-19 up to date. Pneumonia complete. Shingrix complete. Tetanus due 2030. Colonoscopy aged out. Mammogram aged out, pap smear aged out and dexa ordered earlier this year. Counseled about sun safety and mole surveillance. Counseled about the dangers of distracted driving. Given 10 year screening recommendations.

## 2021-03-08 NOTE — Assessment & Plan Note (Signed)
BP at goal on amlodipine 5 mg daily and refilled. Checking CMP and adjust as needed.

## 2021-03-08 NOTE — Assessment & Plan Note (Signed)
Checking lipid panel and adjust as needed. Currently diet controlled.  

## 2021-03-09 ENCOUNTER — Other Ambulatory Visit: Payer: Self-pay | Admitting: Internal Medicine

## 2021-03-09 MED ORDER — VITAMIN D (ERGOCALCIFEROL) 1.25 MG (50000 UNIT) PO CAPS: 50000.0000 [IU] | ORAL_CAPSULE | ORAL | 0 refills | Status: DC

## 2021-03-28 DIAGNOSIS — H40053 Ocular hypertension, bilateral: Secondary | ICD-10-CM | POA: Diagnosis not present

## 2021-03-28 DIAGNOSIS — H40023 Open angle with borderline findings, high risk, bilateral: Secondary | ICD-10-CM | POA: Diagnosis not present

## 2021-05-10 ENCOUNTER — Ambulatory Visit (INDEPENDENT_AMBULATORY_CARE_PROVIDER_SITE_OTHER): Payer: Medicare Other | Admitting: Internal Medicine

## 2021-05-10 ENCOUNTER — Encounter: Payer: Self-pay | Admitting: Internal Medicine

## 2021-05-10 ENCOUNTER — Other Ambulatory Visit: Payer: Self-pay

## 2021-05-10 VITALS — BP 132/70 | HR 67 | Temp 98.5°F | Ht 62.0 in | Wt 152.0 lb

## 2021-05-10 DIAGNOSIS — E559 Vitamin D deficiency, unspecified: Secondary | ICD-10-CM | POA: Diagnosis not present

## 2021-05-10 DIAGNOSIS — E538 Deficiency of other specified B group vitamins: Secondary | ICD-10-CM

## 2021-05-10 DIAGNOSIS — R21 Rash and other nonspecific skin eruption: Secondary | ICD-10-CM | POA: Diagnosis not present

## 2021-05-10 DIAGNOSIS — R739 Hyperglycemia, unspecified: Secondary | ICD-10-CM | POA: Diagnosis not present

## 2021-05-10 MED ORDER — TRIAMCINOLONE ACETONIDE 0.1 % EX CREA: 1.0000 "application " | TOPICAL_CREAM | Freq: Two times a day (BID) | CUTANEOUS | 0 refills | Status: AC

## 2021-05-10 MED ORDER — METHYLPREDNISOLONE ACETATE 80 MG/ML IJ SUSP
80.0000 mg | Freq: Once | INTRAMUSCULAR | Status: AC
  Administered 2021-05-10: 80 mg via INTRAMUSCULAR

## 2021-05-10 MED ORDER — PREDNISONE 10 MG PO TABS: ORAL_TABLET | ORAL | 0 refills | Status: DC

## 2021-05-10 NOTE — Patient Instructions (Signed)
You had the steroid shot today  Please take all new medication as prescribed - the prednisone, and steroid cream if needed  Ok to consider benadryl cream OTC for itchy areas as well  Remember to take the Vit D 2000 units and B12 1000 mcg per day as per Dr Okey Dupre last visit  Please continue all other medications as before, and refills have been done if requested.  Please have the pharmacy call with any other refills you may need.  Please keep your appointments with your specialists as you may have planned

## 2021-05-10 NOTE — Assessment & Plan Note (Signed)
Lab Results  Component Value Date   VITAMINB12 139 (L) 03/07/2021   Low, reminde to start oral replacement - b12 1000 mcg qd

## 2021-05-10 NOTE — Progress Notes (Signed)
Patient ID: Kaylee Davis, female   DOB: 1941-01-30, 81 y.o.   MRN: [id number]        Chief Complaint: follow up rash all over x 5 days       HPI:  Kaylee Davis is a 81 y.o. female here with acute onset pruritic rash hive like it seems primarily to the torso and few to the extremities x 5 days, unknown cause it seems - no new or changed meds or other environmental issue she is aware;  no fever, benadryl helped somewhat but keeps coming back and cant sleep;  no lip or tongue swelling and Pt denies chest pain, increased sob or doe, wheezing, orthopnea, PND, increased LE swelling, palpitations, dizziness or syncope.   Pt denies polydipsia, polyuria, or new focal neuro s/s.        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 Triggered by elevated heart rate   Hypertension    Imbalance    Migraine headache    much reduced frequency   Tachycardia    Episodic   Tingling    left foot    TMJ (temporomandibular joint disorder)    PAST HX GRINDING TEETH --BUT NO LONGER A PROBLEM   Wears glasses    Past Surgical History:  Procedure Laterality Date   ABDOMINAL HYSTERECTOMY  1985   TAH/BSO--for ?fibroids   APPENDECTOMY     ARTHROPLASTY     left Knee    cyst removed      from mouth   KNEE ARTHROSCOPY Left 10/2005   LAPAROSCOPIC APPENDECTOMY  02/2000   REPLACEMENT  TOTAL KNEE Right    TONSILLECTOMY     TOTAL HIP ARTHROPLASTY Right 03/06/2018   Procedure: TOTAL HIP ARTHROPLASTY ANTERIOR APPROACH;  Surgeon: Ollen Gross, MD;  Location: [facility];  Service: Orthopedics;  Laterality: Right;    TOTAL KNEE ARTHROPLASTY Left 07/29/2012   Procedure: LEFT TOTAL KNEE ARTHROPLASTY;  Surgeon: Loanne Drilling, MD;  Location: [facility];  Service: Orthopedics;  Laterality: Left;   TOTAL KNEE ARTHROPLASTY Right 05/28/2015   Procedure: RIGHT TOTAL KNEE ARTHROPLASTY;  Surgeon: Ollen Gross, MD;  Location: [facility];  Service: Orthopedics;  Laterality: Right;   WISDOM TOOTH EXTRACTION  2008    reports that she has never smoked. She has never used smokeless tobacco. She reports that she does not drink alcohol and does not use drugs. family history is not on file. Allergies  Allergen Reactions   Nitrofuran Derivatives Other (See Comments)    Chest pain and elevated liver enzymes   Other     PT STATES THE SULFA AND PENICILLIN SHE TOOK MANY YEARS AGO BOTH HAD RED DYES--SO SHE WONDERED IF ALLERGIC TO RED DYES   Penicillins Hives  Has had penicillin since and has been fine.     Sulfonamide Derivatives Hives   Current Outpatient Medications on File Prior to Visit  Medication Sig Dispense Refill   amLODipine (NORVASC) 5 MG tablet Take 1 tablet (5 mg total) by mouth daily. 90 tablet 3   hydrOXYzine (ATARAX) 25 MG tablet Take 1 tablet (25 mg total) by mouth 2 (two) times daily as needed. 60 tablet 2   PREMARIN vaginal cream Place 1 Applicatorful vaginally once a week.   5   [DISCONTINUED] esomeprazole (NEXIUM) 20 MG capsule Take 1 capsule (20 mg total) by mouth every morning. 90 capsule 3   No current facility-administered medications on file prior to visit.        ROS:  All others reviewed and negative.  Objective        PE:  BP 132/70 (BP Location: Left Arm, Patient Position: Sitting, Cuff Size: Large)    Pulse 67    Temp 98.5 F (36.9 C) (Oral)    Ht 5\' 2"  (1.575  m)    Wt 152 lb (68.9 kg)    LMP 03/21/1983    SpO2 98%    BMI 27.80 kg/m                 Constitutional: Pt appears in NAD               HENT: Head: NCAT.                Right Ear: External ear normal.                 Left Ear: External ear normal.                Eyes: . Pupils are equal, round, and reactive to light. Conjunctivae and EOM are normal               Nose: without d/c or deformity               Neck: Neck supple. Gross normal ROM               Cardiovascular: Normal rate and regular rhythm.                 Pulmonary/Chest: Effort normal and breath sounds without rales or wheezing.                               Neurological: Pt is alert. At baseline orientation, motor grossly intact               Skin: LE edema - none but diffuse hive like rash to torso mostly upper chest but also several lesions to extremities               Psychiatric: Pt behavior is normal without agitation   Micro: none  Cardiac tracings I have personally interpreted today:  none  Pertinent Radiological findings (summarize): none   Lab Results  Component Value Date   WBC 9.1 03/07/2021   HGB 13.3 03/07/2021   HCT 40.3 03/07/2021   PLT 342.0 03/07/2021   GLUCOSE 84 03/07/2021   CHOL 196 03/07/2021   TRIG 230.0 (H) 03/07/2021   HDL 50.90 03/07/2021   LDLDIRECT 120.0 03/07/2021   LDLCALC 134 (H) 01/13/2019   ALT 8 03/07/2021   AST 20 03/07/2021   NA 139 03/07/2021   K 4.0 03/07/2021   CL 101 03/07/2021  CREATININE 0.85 03/07/2021   BUN 14 03/07/2021   CO2 30 03/07/2021   TSH 2.75 03/07/2021   INR 0.94 03/01/2018   HGBA1C 5.9 03/07/2021   Assessment/Plan:  Kaylee Davis is a 81 y.o. White or Caucasian [1] female with  has a past medical history of Arthritis, Dyspepsia, GERD (gastroesophageal reflux disease), GI bleed, History of blood transfusion, History of frequent urinary tract infections, History of PSVT (paroxysmal supraventricular tachycardia), Hypertension, Imbalance, Migraine  headache, Tachycardia, Tingling, TMJ (temporomandibular joint disorder), and Wears glasses.  Vitamin D deficiency Last vitamin D Lab Results  Component Value Date   VD25OH 13.41 (L) 03/07/2021   Low, reminded to start oral replacement   B12 deficiency Lab Results  Component Value Date   VITAMINB12 139 (L) 03/07/2021   Low, reminde to start oral replacement - b12 1000 mcg qd   Hyperglycemia Lab Results  Component Value Date   HGBA1C 5.9 03/07/2021   Stable, pt to continue current medical treatment  - diet   Rash Etiology unclear, but appears to be allergic, for depomedrol im 80, prednisone asd, triam cr or benadryl cream for the worse areas and consider otc pepcid qhs for itching at night as well;  to f/u any worsening symptoms or concerns  Followup: Return if symptoms worsen or fail to improve.  Oliver Barre, MD 05/15/2021 4:31 PM [facility]

## 2021-05-10 NOTE — Assessment & Plan Note (Signed)
Last vitamin D Lab Results  Component Value Date   VD25OH 13.41 (L) 03/07/2021   Low, reminded to start oral replacement

## 2021-05-10 NOTE — Assessment & Plan Note (Signed)
Lab Results  Component Value Date   HGBA1C 5.9 03/07/2021   Stable, pt to continue current medical treatment  - diet

## 2021-05-15 ENCOUNTER — Encounter: Payer: Self-pay | Admitting: Internal Medicine

## 2021-05-15 NOTE — Assessment & Plan Note (Signed)
Etiology unclear, but appears to be allergic, for depomedrol im 80, prednisone asd, triam cr or benadryl cream for the worse areas and consider otc pepcid qhs for itching at night as well;  to f/u any worsening symptoms or concerns

## 2021-07-26 DIAGNOSIS — H40023 Open angle with borderline findings, high risk, bilateral: Secondary | ICD-10-CM | POA: Diagnosis not present

## 2021-08-02 ENCOUNTER — Ambulatory Visit (INDEPENDENT_AMBULATORY_CARE_PROVIDER_SITE_OTHER): Payer: Medicare Other | Admitting: Internal Medicine

## 2021-08-02 ENCOUNTER — Encounter: Payer: Self-pay | Admitting: Internal Medicine

## 2021-08-02 VITALS — BP 136/64 | HR 84 | Temp 98.1°F | Ht 62.0 in | Wt 163.0 lb

## 2021-08-02 DIAGNOSIS — R21 Rash and other nonspecific skin eruption: Secondary | ICD-10-CM

## 2021-08-02 DIAGNOSIS — T783XXA Angioneurotic edema, initial encounter: Secondary | ICD-10-CM | POA: Diagnosis not present

## 2021-08-02 DIAGNOSIS — E559 Vitamin D deficiency, unspecified: Secondary | ICD-10-CM

## 2021-08-02 MED ORDER — PREDNISONE 10 MG PO TABS: ORAL_TABLET | ORAL | 0 refills | Status: DC

## 2021-08-02 MED ORDER — METHYLPREDNISOLONE ACETATE 80 MG/ML IJ SUSP
80.0000 mg | Freq: Once | INTRAMUSCULAR | Status: AC
  Administered 2021-08-02: 80 mg via INTRAMUSCULAR

## 2021-08-02 MED ORDER — MONTELUKAST SODIUM 10 MG PO TABS: 10.0000 mg | ORAL_TABLET | Freq: Every day | ORAL | 3 refills | Status: DC

## 2021-08-02 NOTE — Assessment & Plan Note (Addendum)
Mid upper lip only - for tx as above, also for allergy serology testing with [place] panel and food allergy panel

## 2021-08-02 NOTE — Patient Instructions (Signed)
You had the steroid shot today ? ?Please take all new medication as prescribed - the prednisone, but also the Singulair more long term ? ?You can also take Benadryl 50 mg OTC every 6 hrs as needed for serious rash, itching and/or swelling ? ?Please continue all other medications as before, and refills have been done if requested. ? ?Please have the pharmacy call with any other refills you may need. ? ?Please keep your appointments with your specialists as you may have planned ? ?You will be contacted regarding the referral for: Allergy ? ?Please go to the LAB at the blood drawing area for the tests to be done ? ?You will be contacted by phone if any changes need to be made immediately.  Otherwise, you will receive a letter about your results with an explanation, but please check with MyChart first. ? ?Please remember to sign up for MyChart if you have not done so, as this will be important to you in the future with finding out test results, communicating by private email, and scheduling acute appointments online when needed. ? ? ? ? ? ?

## 2021-08-02 NOTE — Progress Notes (Signed)
Patient ID: Kaylee HastenBrenda B Rawdon, female   DOB: 1940-11-04, 81 y.o.   MRN: [id number]        Chief Complaint: follow up hives and upper lip swelling       HPI:  Kaylee Davis is a 81 y.o. female here with c/o sudden 3 days flare of hive like rash rather dramatic and diffuse to torso and extremities, but without angioedema, tongue or throat swelling, diffuculty swallowing or sob.  Current med zyrtec no help.  Nothing else makes better or worse, has not tried benadryl prn.  Has not seen allergy prior.  Pt denies chest pain, increased sob or doe, wheezing, orthopnea, PND, increased LE swelling, palpitations, dizziness or syncope.   Pt denies polydipsia, polyuria, or new focal neuro s/s.    Pt denies fever, wt loss, night sweats, loss of appetite, or other constitutional symptoms  Has gained wt for unclear reason, maybe somewhat less active recently.       Wt Readings from Last 3 Encounters:  08/02/21 163 lb (73.9 kg)  05/10/21 152 lb (68.9 kg)  03/07/21 155 lb (70.3 kg)   BP Readings from Last 3 Encounters:  08/02/21 136/64  05/10/21 132/70  03/07/21 130/78         Past Medical History:  Diagnosis Date   Arthritis    OA BOTH KNEES; OCCAS PAIN LEFT SHOULDER - HX OF ROTATOR CUFF PROBLEM   Dyspepsia    GERD (gastroesophageal reflux disease)    GI bleed    GI BLEED FROM DUODENAL ULCER--ABOUT 17 YRS AGO - GIVEN TRANSFUSIONS   History of blood transfusion    1990's   History of frequent urinary tract infections    History of PSVT (paroxysmal supraventricular tachycardia)    can control by doing a valsalva. Triggered by elevated heart rate   Hypertension    Imbalance    Migraine headache    much reduced frequency   Tachycardia    Episodic   Tingling    left foot    TMJ (temporomandibular joint disorder)    PAST HX GRINDING TEETH --BUT NO LONGER A PROBLEM   Wears glasses    Past Surgical History:  Procedure Laterality Date   ABDOMINAL HYSTERECTOMY  1985   TAH/BSO--for ?fibroids    APPENDECTOMY     ARTHROPLASTY     left Knee    cyst removed      from mouth   KNEE ARTHROSCOPY Left 10/2005   LAPAROSCOPIC APPENDECTOMY  02/2000   REPLACEMENT TOTAL KNEE Right    TONSILLECTOMY     TOTAL HIP ARTHROPLASTY Right 03/06/2018   Procedure: TOTAL HIP ARTHROPLASTY ANTERIOR APPROACH;  Surgeon: Ollen GrossAluisio, Frank, MD;  Location: [facility];  Service: Orthopedics;  Laterality: Right;  100min   TOTAL KNEE ARTHROPLASTY Left 07/29/2012   Procedure: LEFT TOTAL KNEE ARTHROPLASTY;  Surgeon: Loanne DrillingFrank V Aluisio, MD;  Location: [facility];  Service: Orthopedics;  Laterality: Left;   TOTAL KNEE ARTHROPLASTY Right 05/28/2015   Procedure: RIGHT TOTAL KNEE ARTHROPLASTY;  Surgeon: Ollen GrossFrank Aluisio, MD;  Location: [facility];  Service: Orthopedics;  Laterality: Right;   WISDOM TOOTH EXTRACTION  2008    reports that she has never smoked. She has never used smokeless tobacco. She reports that she does not drink alcohol and does not use drugs. family history is not on file. Allergies  Allergen Reactions   Nitrofuran Derivatives Other (See Comments)    Chest pain and elevated liver enzymes   Other     PT STATES THE  SULFA AND PENICILLIN SHE TOOK MANY YEARS AGO BOTH HAD RED DYES--SO SHE WONDERED IF ALLERGIC TO RED DYES   Penicillins Hives    Has had penicillin since and has been fine.     Sulfonamide Derivatives Hives   Current Outpatient Medications on File Prior to Visit  Medication Sig Dispense Refill   amLODipine (NORVASC) 5 MG tablet Take 1 tablet (5 mg total) by mouth daily. 90 tablet 3   hydrOXYzine (ATARAX) 25 MG tablet Take 1 tablet (25 mg total) by mouth 2 (two) times daily as needed. 60 tablet 2   PREMARIN vaginal cream Place 1 Applicatorful vaginally once a week.   5   triamcinolone cream (KENALOG) 0.1 % Apply 1 application topically 2 (two) times daily. 30 g 0   [DISCONTINUED] esomeprazole (NEXIUM) 20 MG capsule Take 1 capsule (20 mg total) by mouth every morning. 90 capsule 3   No current  facility-administered medications on file prior to visit.        ROS:  All others reviewed and negative.  Objective        PE:  BP 136/64 (BP Location: Left Arm, Patient Position: Sitting, Cuff Size: Large)   Pulse 84   Temp 98.1 F (36.7 C) (Oral)   Ht 5\' 2"  (1.575 m)   Wt 163 lb (73.9 kg)   LMP 03/21/1983   SpO2 98%   BMI 29.81 kg/m                 Constitutional: Pt appears in NAD               HENT: Head: NCAT.                Right Ear: External ear normal.                 Left Ear: External ear normal.                Eyes: . Pupils are equal, round, and reactive to light. Conjunctivae and EOM are normal               Nose: without d/c or deformity               Neck: Neck supple. Gross normal ROM               Cardiovascular: Normal rate and regular rhythm.                 Pulmonary/Chest: Effort normal and breath sounds without rales or wheezing.                               Neurological: Pt is alert. At baseline orientation, motor grossly intact               Skin:  LE edema - none, but had diffuse hive like rash mostly to torso but also to extremities, none to face except for limited area to mid upper lip below the now               Psychiatric: Pt behavior is normal without agitation   Micro: none  Cardiac tracings I have personally interpreted today:  none  Pertinent Radiological findings (summarize): none   Lab Results  Component Value Date   WBC 9.1 03/07/2021   HGB 13.3 03/07/2021   HCT 40.3 03/07/2021   PLT 342.0 03/07/2021   GLUCOSE 84 03/07/2021   CHOL 196  03/07/2021   TRIG 230.0 (H) 03/07/2021   HDL 50.90 03/07/2021   LDLDIRECT 120.0 03/07/2021   LDLCALC 134 (H) 01/13/2019   ALT 8 03/07/2021   AST 20 03/07/2021   NA 139 03/07/2021   K 4.0 03/07/2021   CL 101 03/07/2021   CREATININE 0.85 03/07/2021   BUN 14 03/07/2021   CO2 30 03/07/2021   TSH 2.75 03/07/2021   INR 0.94 03/01/2018   HGBA1C 5.9 03/07/2021   Assessment/Plan:  ANEYA DADDONA is a 81 y.o. White or Caucasian [1] female with  has a past medical history of Arthritis, Dyspepsia, GERD (gastroesophageal reflux disease), GI bleed, History of blood transfusion, History of frequent urinary tract infections, History of PSVT (paroxysmal supraventricular tachycardia), Hypertension, Imbalance, Migraine headache, Tachycardia, Tingling, TMJ (temporomandibular joint disorder), and Wears glasses.  Vitamin D deficiency Last vitamin D Lab Results  Component Value Date   VD25OH 13.41 (L) 03/07/2021   Low, reminded to take oral replacement  Angioedema Mid upper lip only - for tx as above, also for allergy serology testing with  panel and food allergy panel  Rash Recent worsening, for depomedrol im 80, predniosone asd, add singulair, Also for benadryl 50 mg q 6 prn, refer allergy  Followup: Return if symptoms worsen or fail to improve.  Oliver Barre, MD 08/04/2021 7:46 PM [facility]

## 2021-08-02 NOTE — Assessment & Plan Note (Signed)
Last vitamin D ?Lab Results  ?Component Value Date  ? VD25OH 13.41 (L) 03/07/2021  ? ?Low, reminded to take oral replacement ?

## 2021-08-03 LAB — INTERPRETATION:

## 2021-08-03 LAB — RESPIRATORY ALLERGY PROFILE REGION II ~~LOC~~

## 2021-08-03 LAB — FOOD ALLERGY PROFILE
Allergen, Salmon, f41: <0.1 kU/L
Almonds: <0.1 kU/L
CLASS: 0
CLASS: 0
CLASS: 0
CLASS: 0
CLASS: 0
CLASS: 0
CLASS: 0
CLASS: 0
CLASS: 0
CLASS: 0
CLASS: 0
Cashew IgE: <0.1 kU/L
Class: 0
Class: 0
Class: 0
Class: 0
Egg White IgE: <0.1 kU/L
Fish Cod: <0.1 kU/L
Hazelnut: <0.1 kU/L
Milk IgE: <0.1 kU/L
Peanut IgE: <0.1 kU/L
Scallop IgE: <0.1 kU/L
Sesame Seed f10: <0.1 kU/L
Shrimp IgE: <0.1 kU/L
Soybean IgE: <0.1 kU/L
Tuna IgE: <0.1 kU/L
Walnut: <0.1 kU/L
Wheat IgE: <0.1 kU/L

## 2021-08-04 NOTE — Assessment & Plan Note (Addendum)
Recent worsening, for depomedrol im 80, predniosone asd, add singulair, Also for benadryl 50 mg q 6 prn, refer allergy

## 2021-09-01 DIAGNOSIS — N3020 Other chronic cystitis without hematuria: Secondary | ICD-10-CM | POA: Diagnosis not present

## 2021-09-01 DIAGNOSIS — N952 Postmenopausal atrophic vaginitis: Secondary | ICD-10-CM | POA: Diagnosis not present

## 2021-10-04 ENCOUNTER — Ambulatory Visit: Payer: Medicare Other | Admitting: Allergy & Immunology

## 2021-10-04 VITALS — BP 132/72 | HR 68 | Temp 98.2°F | Ht 62.0 in | Wt 163.0 lb

## 2021-10-04 DIAGNOSIS — R21 Rash and other nonspecific skin eruption: Secondary | ICD-10-CM

## 2021-10-04 NOTE — Patient Instructions (Addendum)
1. Rash - We are going to add on topical Opzelura to try to see if this works. - This is not a steroid ointment and should not cause the side effects of otopical steroids. - Give [place] an update in 1-2 weeks. - I think Dermatology might be a good next step!   2. Return in about 6 months (around 04/06/2022).    Please inform us of any Emergency Department visits, hospitalizations, or changes in symptoms. Call us before going to the ED for breathing or allergy symptoms since we might be able to fit you in for a sick visit. Feel free to contact us anytime with any questions, problems, or concerns.  It was a pleasure to meet you and your family today!  Websites that have reliable patient information: 1. American Academy of Asthma, Allergy, and Immunology: www.aaaai.org 2. Food Allergy Research and Education (FARE): foodallergy.org 3. Mothers of Asthmatics: http://www.asthmacommunitynetwork.org 4. American College of Allergy, Asthma, and Immunology: www.acaai.org   COVID-19 Vaccine Information can be found at: [email or web address] For questions related to vaccine distribution or appointments, please email [email or web address] or call [phone].   We realize that you might be concerned about having an allergic reaction to the COVID19 vaccines. To help with that concern, WE ARE OFFERING THE COVID19 VACCINES IN OUR OFFICE! Ask the front desk for dates!     "Like" [place] on Facebook and Instagram for our latest updates!      A healthy democracy works best when [employer] participate! Make sure you are registered to vote! If you have moved or changed any of your contact information, you will need to get this updated before voting!  In some cases, you MAY be able to register to vote online: [email or web address]

## 2021-10-04 NOTE — Progress Notes (Unsigned)
NEW PATIENT  Date of Service/Encounter:  10/04/21  Consult requested by: Myrlene Broker, MD   Assessment:   Rash - with residual hyperpigmented lesions (adding Opzelura sample today to see if this helps at all)  Plan/Recommendations:   1. Rash - We are going to add on topical Opzelura to try to see if this works. - This is not a steroid ointment and should not cause the side effects of otopical steroids. - Give [place] an update in 1-2 weeks. - I think Dermatology might be a good next step!   2. Return in about 6 months (around 04/06/2022).    This note in its entirety was forwarded to the Provider who requested this consultation.  Subjective:   Kaylee Davis is a 81 y.o. female presenting today for evaluation of  Chief Complaint  Patient presents with   New Patient (Initial Visit)    Recent rashes, unknown cause/source, previous reactions to poison ivy but does not think this was cause, did respond to oral prednisone x2 and triamcinolone    Kaylee Davis has a history of the following: Patient Active Problem List   Diagnosis Date Noted   Angioedema 08/02/2021   Rash 05/10/2021   Vitamin D deficiency 05/10/2021   B12 deficiency 05/10/2021   Hyperglycemia 05/10/2021   Adjustment disorder with mixed anxiety and depressed mood 08/24/2020   Urinary frequency 08/24/2020   PVC (premature ventricular contraction) 01/15/2020   Hyperlipidemia with target LDL less than 130 01/15/2020   CKD (chronic kidney disease) stage 3, GFR 30-59 ml/min (HCC) 01/15/2020   OA (osteoarthritis) of hip 03/06/2018   OA (osteoarthritis) of knee 05/28/2015   Routine health maintenance 02/03/2011   Essential hypertension 02/26/2007   PSVT 02/26/2007   GERD (gastroesophageal reflux disease) 02/26/2007    History obtained from: chart review and patient and husband .  Kaylee Davis was referred by Myrlene Broker, MD.     Kaylee Davis is a 81 y.o. female presenting for an  evaluation of a rash . She has an allergy to poison ivy. She developed a rash pon her upper chest that started to spread over the entire body. It spread like "circles" with clusters on the center with poison ivy. She had this rash from head to toe. The rash itself has resolved, but then hyperpigmented lesions remained.   She treated this with a topical steroid. It did not go away and just started coming back. Then it did go away. She was worried because it was leaving these black spots. They are not itchy.  She does not remember when the rash first started. This has been some time since she first had these. She thinks that this was in the winter months. She was not outdoors doing anything in particular. There were no new exposures that she can ascertain.   She did eat some Yoplait yogurt, but this is the only new exposure that she had that she has never eaten before. She also buys a lot of flowers at the store and she thinks that maybe this was a preservative.   These new lesions are not moving and they are not really getting any worse or better. They are no longer itching at all. She has not been referred to Dermatology yet.   One son passed away from COVID one year ago at the age of 63. They have a son who is going to be 28 in December. There is one grand son who is 38 years old.  She otherwise has no atopic history at all. She does have a history of bilateral knee replacements as well as a hip replacement.  She still walks with a cane, but mostly just in public.  She is just deathly afraid of falling down.  Otherwise, there is no history of other atopic diseases, including asthma, food allergies, drug allergies, stinging insect allergies, or contact dermatitis. There is no significant infectious history. Vaccinations are up to date.    Past Medical History: Patient Active Problem List   Diagnosis Date Noted   Angioedema 08/02/2021   Rash 05/10/2021   Vitamin D deficiency 05/10/2021    B12 deficiency 05/10/2021   Hyperglycemia 05/10/2021   Adjustment disorder with mixed anxiety and depressed mood 08/24/2020   Urinary frequency 08/24/2020   PVC (premature ventricular contraction) 01/15/2020   Hyperlipidemia with target LDL less than 130 01/15/2020   CKD (chronic kidney disease) stage 3, GFR 30-59 ml/min (HCC) 01/15/2020   OA (osteoarthritis) of hip 03/06/2018   OA (osteoarthritis) of knee 05/28/2015   Routine health maintenance 02/03/2011   Essential hypertension 02/26/2007   PSVT 02/26/2007   GERD (gastroesophageal reflux disease) 02/26/2007    Medication List:  Allergies as of 10/04/2021       Reactions   Nitrofuran Derivatives Other (See Comments)   Chest pain and elevated liver enzymes   Other    PT STATES THE SULFA AND PENICILLIN SHE TOOK MANY YEARS AGO BOTH HAD RED DYES--SO SHE WONDERED IF ALLERGIC TO RED DYES   Penicillins Hives   Has had penicillin since and has been fine.    Sulfonamide Derivatives Hives        Medication List        Accurate as of October 04, 2021  6:15 PM. If you have any questions, ask your nurse or doctor.          STOP taking these medications    predniSONE 10 MG tablet Commonly known as: DELTASONE       TAKE these medications    amLODipine 5 MG tablet Commonly known as: NORVASC Take 1 tablet (5 mg total) by mouth daily.   hydrOXYzine 25 MG tablet Commonly known as: ATARAX Take 1 tablet (25 mg total) by mouth 2 (two) times daily as needed.   montelukast 10 MG tablet Commonly known as: SINGULAIR Take 1 tablet (10 mg total) by mouth at bedtime.   Premarin vaginal cream Generic drug: conjugated estrogens Place 1 Applicatorful vaginally once a week.   triamcinolone cream 0.1 % Commonly known as: KENALOG Apply 1 application topically 2 (two) times daily.        Birth History: non-contributory  Developmental History: non-contributory  Past Surgical History: Past Surgical History:  Procedure  Laterality Date   ABDOMINAL HYSTERECTOMY  1985   TAH/BSO--for ?fibroids   APPENDECTOMY     ARTHROPLASTY     left Knee    cyst removed      from mouth   KNEE ARTHROSCOPY Left 10/2005   LAPAROSCOPIC APPENDECTOMY  02/2000   REPLACEMENT TOTAL KNEE Right    TONSILLECTOMY     TOTAL HIP ARTHROPLASTY Right 03/06/2018   Procedure: TOTAL HIP ARTHROPLASTY ANTERIOR APPROACH;  Surgeon: Gaynelle Arabian, MD;  Location: [facility];  Service: Orthopedics;  Laterality: Right;  168min   TOTAL KNEE ARTHROPLASTY Left 07/29/2012   Procedure: LEFT TOTAL KNEE ARTHROPLASTY;  Surgeon: Gearlean Alf, MD;  Location: [facility];  Service: Orthopedics;  Laterality: Left;   TOTAL KNEE ARTHROPLASTY Right 05/28/2015  Procedure: RIGHT TOTAL KNEE ARTHROPLASTY;  Surgeon: Ollen Gross, MD;  Location: [facility];  Service: Orthopedics;  Laterality: Right;   WISDOM TOOTH EXTRACTION  2008     Family History: Family History  Problem Relation Age of Onset   Cancer Neg Hx        Breast/Colon   Diabetes Neg Hx    Heart disease Neg Hx        CAD/MI     Social History: Teighan lives at home with her husband.  She used to work at [employer].  Her husband used to work in a Engineering geologist.  They live in a house that is 83 years old.  There is wood and rugs in the main living areas and carpeting in the bedroom.  They have gas heating and central cooling.  There are no animals inside or outside of the home.  There are no dust mite covers on the bedding.  There is no tobacco exposure.  There is no fume, chemical, or dust exposure.  There is no tobacco exposure.   Review of Systems  Constitutional: Negative.  Negative for chills, fever, malaise/fatigue and weight loss.  HENT: Negative.  Negative for congestion, ear discharge and ear pain.   Eyes:  Negative for pain, discharge and redness.  Respiratory:  Negative for cough, sputum production, shortness of breath and wheezing.   Cardiovascular: Negative.  Negative for chest pain and  palpitations.  Gastrointestinal:  Negative for abdominal pain, constipation, diarrhea, heartburn, nausea and vomiting.  Musculoskeletal:  Positive for joint pain.  Skin:  Positive for rash. Negative for itching.  Neurological:  Negative for dizziness and headaches.  Endo/Heme/Allergies:  Negative for environmental allergies. Does not bruise/bleed easily.       Objective:   Blood pressure 132/72, pulse 68, temperature 98.2 F (36.8 C), temperature source Temporal, height 5\' 2"  (1.575 m), weight 163 lb (73.9 kg), last menstrual period 03/21/1983, SpO2 97 %. Body mass index is 29.81 kg/m.     Physical Exam Constitutional:      Appearance: She is well-developed.  HENT:     Head: Normocephalic and atraumatic.     Right Ear: Tympanic membrane, ear canal and external ear normal. No drainage, swelling or tenderness. Tympanic membrane is not injected, scarred, erythematous, retracted or bulging.     Left Ear: Tympanic membrane, ear canal and external ear normal. No drainage, swelling or tenderness. Tympanic membrane is not injected, scarred, erythematous, retracted or bulging.     Nose: No nasal deformity, septal deviation, mucosal edema or rhinorrhea.     Right Sinus: No maxillary sinus tenderness or frontal sinus tenderness.     Left Sinus: No maxillary sinus tenderness or frontal sinus tenderness.     Mouth/Throat:     Mouth: Mucous membranes are not pale and not dry.     Pharynx: Uvula midline.  Eyes:     General:        Right eye: No discharge.        Left eye: No discharge.     Conjunctiva/sclera: Conjunctivae normal.     Right eye: Right conjunctiva is not injected. No chemosis.    Left eye: Left conjunctiva is not injected. No chemosis.    Pupils: Pupils are equal, round, and reactive to light.  Cardiovascular:     Rate and Rhythm: Normal rate and regular rhythm.     Heart sounds: Normal heart sounds.  Pulmonary:     Effort: Pulmonary effort is normal. No tachypnea,  accessory  muscle usage or respiratory distress.     Breath sounds: Normal breath sounds. No wheezing, rhonchi or rales.  Chest:     Chest wall: No tenderness.  Abdominal:     Tenderness: There is no abdominal tenderness. There is no guarding or rebound.  Lymphadenopathy:     Head:     Right side of head: No submandibular, tonsillar or occipital adenopathy.     Left side of head: No submandibular, tonsillar or occipital adenopathy.     Cervical: No cervical adenopathy.  Skin:    General: Skin is warm.     Capillary Refill: Capillary refill takes less than 2 seconds.     Coloration: Skin is not pale.     Findings: Rash present. No abrasion, erythema or petechiae. Rash is not papular, urticarial or vesicular.     Comments: She has around 3 or 4 hyperpigmented lesions on the bilateral legs.  There are some excoriation marks.  There is no honey crusting or oozing.  Neurological:     Mental Status: She is alert.      Diagnostic studies: none         Salvatore Marvel, MD [facility]

## 2021-10-05 ENCOUNTER — Encounter: Payer: Self-pay | Admitting: Allergy & Immunology

## 2021-10-06 ENCOUNTER — Emergency Department (HOSPITAL_BASED_OUTPATIENT_CLINIC_OR_DEPARTMENT_OTHER)
Admission: EM | Admit: 2021-10-06 | Discharge: 2021-10-06 | Disposition: A | Discharge status: Home / Self Care | Payer: Medicare Other | Attending: Emergency Medicine | Admitting: Emergency Medicine

## 2021-10-06 ENCOUNTER — Other Ambulatory Visit (HOSPITAL_BASED_OUTPATIENT_CLINIC_OR_DEPARTMENT_OTHER): Payer: Self-pay

## 2021-10-06 ENCOUNTER — Encounter (HOSPITAL_BASED_OUTPATIENT_CLINIC_OR_DEPARTMENT_OTHER): Payer: Self-pay | Admitting: Emergency Medicine

## 2021-10-06 ENCOUNTER — Other Ambulatory Visit: Payer: Self-pay

## 2021-10-06 DIAGNOSIS — R21 Rash and other nonspecific skin eruption: Secondary | ICD-10-CM | POA: Diagnosis not present

## 2021-10-06 DIAGNOSIS — T7840XA Allergy, unspecified, initial encounter: Secondary | ICD-10-CM | POA: Diagnosis not present

## 2021-10-06 MED ORDER — METHYLPREDNISOLONE SODIUM SUCC 125 MG IJ SOLR
125.0000 mg | Freq: Once | INTRAMUSCULAR | Status: AC
  Administered 2021-10-06: 125 mg via INTRAVENOUS
  Filled 2021-10-06: qty 2

## 2021-10-06 MED ORDER — SODIUM CHLORIDE 0.9 % IV BOLUS
500.0000 mL | Freq: Once | INTRAVENOUS | Status: AC
  Administered 2021-10-06: 500 mL via INTRAVENOUS

## 2021-10-06 MED ORDER — DIPHENHYDRAMINE HCL 25 MG PO CAPS: 25.0000 mg | ORAL_CAPSULE | Freq: Once | ORAL | Status: DC

## 2021-10-06 MED ORDER — PREDNISONE 10 MG PO TABS
20.0000 mg | ORAL_TABLET | Freq: Every day | ORAL | 0 refills | Status: AC
  Filled 2021-10-06: qty 10, 5d supply, fill #0

## 2021-10-06 MED ORDER — DIPHENHYDRAMINE HCL 25 MG PO TABS
25.0000 mg | ORAL_TABLET | Freq: Four times a day (QID) | ORAL | 0 refills | Status: DC | PRN
  Filled 2021-10-06: qty 100, 25d supply, fill #0

## 2021-10-06 MED ORDER — DIPHENHYDRAMINE HCL 50 MG/ML IJ SOLN
25.0000 mg | Freq: Once | INTRAMUSCULAR | Status: AC
  Administered 2021-10-06: 25 mg via INTRAVENOUS
  Filled 2021-10-06: qty 1

## 2021-10-06 MED ORDER — EPINEPHRINE 0.3 MG/0.3ML IJ SOAJ
0.3000 mg | INTRAMUSCULAR | 0 refills | Status: AC | PRN
  Filled 2021-10-06: qty 2, 1d supply, fill #0

## 2021-10-06 NOTE — Discharge Instructions (Addendum)
Take Benadryl and steroids as prescribed.  The dermatologist for further diagnostic testing of rash.  Use EpiPen only for difficulty breathing, feelings of throat closing, or tongue swelling.  Then promptly return to emergency department.

## 2021-10-06 NOTE — ED Triage Notes (Addendum)
Pt arrives to ED with c/o rash and allergic reaction (swollen lips). Pt reports generalized full body rash. She reports this has occurred twice within the last year and she was treated by her GP. She reports the rash are circular and itch. Pt denies swelling in throat or SOB. No angioedema on exam.

## 2021-10-06 NOTE — ED Provider Notes (Signed)
[facility] EMERGENCY DEPT Provider Note   CSN: [id number] Arrival date & time: 10/06/21  1008     History  Chief Complaint  Patient presents with   Allergic Reaction   Rash    Kaylee Davis is a 81 y.o. female.  Patient is an 81 year old female presenting for erythematous pruritic rash on legs, upper extremities, trunk, and face.  Patient admits to similar symptoms in the past.  No difficulty breathing, tongue swelling, or drooling.  No difficulty swallowing.  The history is provided by the patient. No language interpreter was used.  Allergic Reaction Presenting symptoms: rash   Rash Associated symptoms: no abdominal pain, no fever, no joint pain, no shortness of breath, no sore throat and not vomiting        Home Medications Prior to Admission medications   Medication Sig Start Date End Date Taking? Authorizing Provider  diphenhydrAMINE (BENADRYL) 25 MG tablet Take 1 tablet (25 mg total) by mouth every 6 (six) hours as needed for up to 3 days. 10/06/21 10/31/21 Yes Edwin Dada P, DO  EPINEPHrine 0.3 mg/0.3 mL IJ SOAJ injection Inject 0.3 mg into the muscle as needed for anaphylaxis. 10/06/21  Yes Edwin Dada P, DO  predniSONE (DELTASONE) 10 MG tablet Take 2 tablets (20 mg total) by mouth daily for 5 days. 10/06/21 10/11/21 Yes Edwin Dada P, DO  amLODipine (NORVASC) 5 MG tablet Take 1 tablet (5 mg total) by mouth daily. 03/07/21   Myrlene Broker, MD  hydrOXYzine (ATARAX) 25 MG tablet Take 1 tablet (25 mg total) by mouth 2 (two) times daily as needed. 03/07/21   Myrlene Broker, MD  montelukast (SINGULAIR) 10 MG tablet Take 1 tablet (10 mg total) by mouth at bedtime. 08/02/21   Corwin Levins, MD  PREMARIN vaginal cream Place 1 Applicatorful vaginally once a week.  11/18/17   [provider]  triamcinolone cream (KENALOG) 0.1 % Apply 1 application topically 2 (two) times daily. 05/10/21 05/10/22  Corwin Levins, MD  esomeprazole (NEXIUM) 20 MG  capsule Take 1 capsule (20 mg total) by mouth every morning. 05/20/15 08/16/20  Myrlene Broker, MD      Allergies    Nitrofuran derivatives, Other, Penicillins, and Sulfonamide derivatives    Review of Systems   Review of Systems  Constitutional:  Negative for chills and fever.  HENT:  Negative for ear pain and sore throat.   Eyes:  Negative for pain and visual disturbance.  Respiratory:  Negative for cough and shortness of breath.   Cardiovascular:  Negative for chest pain and palpitations.  Gastrointestinal:  Negative for abdominal pain and vomiting.  Genitourinary:  Negative for dysuria and hematuria.  Musculoskeletal:  Negative for arthralgias and back pain.  Skin:  Positive for rash. Negative for color change.  Neurological:  Negative for seizures and syncope.  All other systems reviewed and are negative.   Physical Exam Updated Vital Signs BP 111/62   Pulse (!) 56   Temp 98 F (36.7 C)   Resp 18   LMP 03/21/1983   SpO2 98%  Physical Exam Vitals and nursing note reviewed.  Constitutional:      General: She is not in acute distress.    Appearance: She is well-developed.  HENT:     Head: Normocephalic and atraumatic.  Eyes:     Conjunctiva/sclera: Conjunctivae normal.  Cardiovascular:     Rate and Rhythm: Normal rate and regular rhythm.     Heart sounds: No murmur heard.  Pulmonary:     Effort: Pulmonary effort is normal. No respiratory distress.     Breath sounds: Normal breath sounds.  Abdominal:     Palpations: Abdomen is soft.     Tenderness: There is no abdominal tenderness.  Musculoskeletal:        General: No swelling.     Cervical back: Neck supple.  Skin:    General: Skin is warm and dry.     Capillary Refill: Capillary refill takes less than 2 seconds.     Findings: Rash present. Rash is purpuric.  Neurological:     Mental Status: She is alert.  Psychiatric:        Mood and Affect: Mood normal.            ED Results / Procedures /  Treatments   Labs (all labs ordered are listed, but only abnormal results are displayed) Labs Reviewed - No data to display  EKG None  Radiology No results found.  Procedures Procedures    Medications Ordered in ED Medications  methylPREDNISolone sodium succinate (SOLU-MEDROL) 125 mg/2 mL injection 125 mg (125 mg Intravenous Given 10/06/21 1220)  sodium chloride 0.9 % bolus 500 mL (0 mLs Intravenous Stopped 10/06/21 1324)  diphenhydrAMINE (BENADRYL) injection 25 mg (25 mg Intravenous Given 10/06/21 1220)    ED Course/ Medical Decision Making/ A&P                           Medical Decision Making Risk OTC drugs. Prescription drug management.   99:23 PM  81 year old female presenting for erythematous pruritic rash on legs, upper extremities, trunk, and face.  Patient is alert and oriented x3, no acute distress, afebrile, stable vital signs.  No fevers, URI symptoms, or signs of meningitis.  The rash does not include the mucous membranes.  Low suspicion for Stevens-Johnson syndrome.    Patient given Benadryl and steroids and recommendations for close follow-up with dermatology for repeat rash.  Photos uploaded into chart.  Patient does have rash on face however no significant swelling.  Epinephrine pen sent to pharmacy for worse case.  Patient is to develop any facial swelling resulting in shortness of breath or difficulty swallowing.  Patient in no distress and overall condition improved here in the ED. Detailed discussions were had with the patient regarding current findings, and need for close f/u with PCP or on call doctor. The patient has been instructed to return immediately if the symptoms worsen in any way for re-evaluation. Patient verbalized understanding and is in agreement with current care plan. All questions answered prior to discharge.          Final Clinical Impression(s) / ED Diagnoses Final diagnoses:  Rash    Rx / DC Orders ED Discharge Orders           Ordered    EPINEPHrine 0.3 mg/0.3 mL IJ SOAJ injection  As needed        10/06/21 1201    diphenhydrAMINE (BENADRYL) 25 MG tablet  Every 6 hours PRN        10/06/21 1202    predniSONE (DELTASONE) 10 MG tablet  Daily        10/06/21 1202              Franne Forts, DO 10/10/21 2338

## 2021-10-11 ENCOUNTER — Ambulatory Visit (INDEPENDENT_AMBULATORY_CARE_PROVIDER_SITE_OTHER): Payer: Medicare Other | Admitting: Internal Medicine

## 2021-10-11 ENCOUNTER — Encounter: Payer: Self-pay | Admitting: Internal Medicine

## 2021-10-11 VITALS — BP 118/64 | HR 69 | Resp 18 | Ht 62.0 in | Wt 161.0 lb

## 2021-10-11 DIAGNOSIS — I10 Essential (primary) hypertension: Secondary | ICD-10-CM

## 2021-10-11 DIAGNOSIS — F4323 Adjustment disorder with mixed anxiety and depressed mood: Secondary | ICD-10-CM | POA: Diagnosis not present

## 2021-10-11 DIAGNOSIS — N1831 Chronic kidney disease, stage 3a: Secondary | ICD-10-CM | POA: Diagnosis not present

## 2021-10-11 DIAGNOSIS — R21 Rash and other nonspecific skin eruption: Secondary | ICD-10-CM

## 2021-10-11 LAB — CBC
HCT: 41.5 % (ref 36.0–46.0)
Hemoglobin: 13.6 g/dL (ref 12.0–15.0)
MCHC: 32.6 g/dL (ref 30.0–36.0)
MCV: 87.4 fl (ref 78.0–100.0)
Platelets: 350 10*3/uL (ref 150.0–400.0)
RBC: 4.75 Mil/uL (ref 3.87–5.11)
RDW: 13.8 % (ref 11.5–15.5)
WBC: 9.3 10*3/uL (ref 4.0–10.5)

## 2021-10-11 LAB — COMPREHENSIVE METABOLIC PANEL
ALT: 11 U/L (ref 0–35)
AST: 20 U/L (ref 0–37)
Albumin: 4.1 g/dL (ref 3.5–5.2)
Alkaline Phosphatase: 68 U/L (ref 39–117)
BUN: 17 mg/dL (ref 6–23)
CO2: 29 mEq/L (ref 19–32)
Calcium: 9.4 mg/dL (ref 8.4–10.5)
Chloride: 102 mEq/L (ref 96–112)
Creatinine, Ser: 1.03 mg/dL (ref 0.40–1.20)
GFR: 50.33 mL/min — ABNORMAL LOW (ref >60.00)
Glucose, Bld: 80 mg/dL (ref 70–99)
Potassium: 3.3 mEq/L — ABNORMAL LOW (ref 3.5–5.1)
Sodium: 140 mEq/L (ref 135–145)
Total Bilirubin: 0.5 mg/dL (ref 0.2–1.2)
Total Protein: 6.7 g/dL (ref 6.0–8.3)

## 2021-10-11 NOTE — Assessment & Plan Note (Signed)
Unclear cause. She had tried meloxicam (old prescription years old) prior to recent outbreak but not all outbreaks. She had common allergen and food allergen testing without clear cause in May. Checking CBC and CMP today as they were not checked previously. No clear angioedema or facial swelling today.

## 2021-10-11 NOTE — Progress Notes (Signed)
   Subjective:   Patient ID: Kaylee Davis, female    DOB: 30-Jul-1940, 81 y.o.   MRN: [id number]  HPI The patient is an 81 YO female coming in for recurrent rash and facial swelling. Currently taking prednisone and this is making her feel bad.   Review of Systems  Constitutional: Negative.   HENT: Negative.    Eyes: Negative.   Respiratory:  Negative for cough, chest tightness and shortness of breath.   Cardiovascular:  Negative for chest pain, palpitations and leg swelling.  Gastrointestinal:  Negative for abdominal distention, abdominal pain, constipation, diarrhea, nausea and vomiting.  Musculoskeletal: Negative.   Skin:  Positive for rash.  Neurological: Negative.   Psychiatric/Behavioral: Negative.      Objective:  Physical Exam Constitutional:      Appearance: She is well-developed.  HENT:     Head: Normocephalic and atraumatic.  Cardiovascular:     Rate and Rhythm: Normal rate and regular rhythm.  Pulmonary:     Effort: Pulmonary effort is normal. No respiratory distress.     Breath sounds: Normal breath sounds. No wheezing or rales.  Abdominal:     General: Bowel sounds are normal. There is no distension.     Palpations: Abdomen is soft.     Tenderness: There is no abdominal tenderness. There is no rebound.  Musculoskeletal:     Cervical back: Normal range of motion.  Skin:    General: Skin is warm and dry.     Findings: Rash present.  Neurological:     Mental Status: She is alert and oriented to person, place, and time.     Coordination: Coordination normal.     Vitals:   10/11/21 0928  BP: 118/64  Pulse: 69  Resp: 18  SpO2: 94%  Weight: 161 lb (73 kg)  Height: 5\' 2"  (1.575 m)    Assessment & Plan:

## 2021-10-11 NOTE — Assessment & Plan Note (Signed)
She is dealing with a lot. Did try sertraline 25 mg daily and this caused side effects. Wants counseling given resource information about this.

## 2021-10-11 NOTE — Patient Instructions (Addendum)
We will have you take the zyrtec 1 pill 3 times a day.  We will check the labs. We will get you in with cardiology to check the heart.  Go to [email or web address] to get involved with their grief counseling they offer to the community for free.  [phone] to call for the grief counseling

## 2021-10-11 NOTE — Assessment & Plan Note (Signed)
Checking CMP for stability.  

## 2021-10-11 NOTE — Assessment & Plan Note (Signed)
Her husband sees cardiology and she wishes to have evaluation with same cardiologist. 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

## 2021-10-26 DIAGNOSIS — Z96652 Presence of left artificial knee joint: Secondary | ICD-10-CM | POA: Diagnosis not present

## 2021-10-26 DIAGNOSIS — M25552 Pain in left hip: Secondary | ICD-10-CM | POA: Diagnosis not present

## 2021-11-07 ENCOUNTER — Ambulatory Visit: Payer: Self-pay | Admitting: Licensed Clinical Social Worker

## 2021-11-07 NOTE — Patient Outreach (Signed)
  Care Coordination   Initial Visit Note   11/07/2021 Name: Kaylee Davis MRN: [id number] DOB: [birth year]  Kaylee Davis is a 81 y.o. year old female who sees Myrlene Broker, MD for primary care. I spoke with  [name] by phone today  What matters to the patients health and wellness today?   Patient reports no concerns or needs with health and wellness related to physical or mental heath. .   Recommendation: Patient may benefit from, and is in agreement to To explore services in the future if needed.      Goals Addressed             This Visit's Progress    COMPLETED: Care Coordination Activies: No Follow up Requires       Care Coordination Interventions: Reviewed Care Coordination Services:Declined at this time          SDOH assessments and interventions completed:  Yes  SDOH Interventions Today    Flowsheet Row Most Recent Value  SDOH Interventions   SDOH Interventions for the Following Domains Financial Strain, Food Insecurity, Housing, Transportation  Food Insecurity Interventions Intervention Not Indicated  Financial Strain Interventions Intervention Not Indicated  Housing Interventions Intervention Not Indicated  Transportation Interventions Intervention Not Indicated       Care Coordination Interventions Activated:  Yes  Care Coordination Interventions:  Yes, provided   Follow up plan: No further intervention required.   Encounter Outcome:  Pt. Visit Completed   Sammuel Hines, [facility] Care Coordination  [facility] [phone]

## 2021-11-07 NOTE — Patient Instructions (Signed)
Visit Information  Thank you for taking time to speak with me today. Please don't hesitate to contact me if I can be of assistance to you.   Following are the goals we discussed today: None  Goals Addressed             This Visit's Progress    COMPLETED: Care Coordination Activies: No Follow up Requires       Care Coordination Interventions: Reviewed Care Coordination Services:Declined at this time          Please call the care guide team at [phone] if you would like to schedule a phone appointment with our nurse or social work care coordinator.   Patient verbalizes understanding of instructions and care plan provided today and agrees to view in MyChart. Active MyChart status and patient understanding of how to access instructions and care plan via MyChart confirmed with patient.     No further follow up required: by Care Coordination at this time  Sammuel Hines, [facility] Coordination  [facility] [employer] [phone]

## 2021-11-09 DIAGNOSIS — M25552 Pain in left hip: Secondary | ICD-10-CM | POA: Diagnosis not present

## 2021-11-11 ENCOUNTER — Ambulatory Visit: Payer: Medicare Other | Admitting: Cardiovascular Disease

## 2021-11-11 ENCOUNTER — Encounter: Payer: Self-pay | Admitting: Cardiovascular Disease

## 2021-11-11 VITALS — BP 122/66 | HR 62 | Ht 62.0 in | Wt 160.0 lb

## 2021-11-11 DIAGNOSIS — R011 Cardiac murmur, unspecified: Secondary | ICD-10-CM | POA: Diagnosis not present

## 2021-11-11 DIAGNOSIS — I10 Essential (primary) hypertension: Secondary | ICD-10-CM | POA: Diagnosis not present

## 2021-11-11 DIAGNOSIS — I471 Supraventricular tachycardia: Secondary | ICD-10-CM

## 2021-11-11 NOTE — Patient Instructions (Signed)
Medication Instructions:  No changes *If you need a refill on your cardiac medications before your next appointment, please call your pharmacy*   Lab Work: none If you have labs (blood work) drawn today and your tests are completely normal, you will receive your results only by: MyChart Message (if you have MyChart) OR A paper copy in the mail If you have any lab test that is abnormal or we need to change your treatment, we will call you to review the results.   Testing/Procedures: Your physician has requested that you have an echocardiogram. Echocardiography is a painless test that uses sound waves to create images of your heart. It provides your doctor with information about the size and shape of your heart and how well your heart's chambers and valves are working. This procedure takes approximately one hour. There are no restrictions for this procedure.   Follow-Up: At [facility], you and your health needs are our priority.  As part of our continuing mission to provide you with exceptional heart care, we have created designated Provider Care Teams.  These Care Teams include your primary Cardiologist (physician) and Advanced Practice Providers (APPs -  Physician Assistants and Nurse Practitioners) who all work together to provide you with the care you need, when you need it.   Your next appointment:   12 month(s)  The format for your next appointment:   In Person  Provider:   Christopher McAlhany, MD     Important Information About Sugar       

## 2021-11-11 NOTE — Progress Notes (Signed)
Chief Complaint  Patient presents with   New Patient (Initial Visit)    HTN   History of Present Illness: 81 yo female with history of arthritis, GERD, SVT, HTN, who is here today as a new consult, referred by Dr. Okey Dupre, for the evaluation of HTN and SVT. She has had SVT in the past. She has rare episodes of palpitations that last several minutes. She has rare chest pressure at rest but  no exertional chest pain. No LE edema, dizziness. She is very active. Echo in 2017 with normal LV function and no valve disease.   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 Triggered by elevated heart rate   Hypertension    Imbalance    Migraine headache    much reduced frequency   Tachycardia    Episodic   Tingling    left foot    TMJ (temporomandibular joint disorder)    PAST HX GRINDING TEETH --BUT NO LONGER A PROBLEM   Wears glasses     Past Surgical History:  Procedure Laterality Date   ABDOMINAL HYSTERECTOMY  1985   TAH/BSO--for ?fibroids   APPENDECTOMY     ARTHROPLASTY     left Knee    cyst removed      from mouth   KNEE ARTHROSCOPY Left 10/2005   LAPAROSCOPIC APPENDECTOMY  02/2000   REPLACEMENT TOTAL KNEE Right    TONSILLECTOMY     TOTAL HIP ARTHROPLASTY Right 03/06/2018   Procedure: TOTAL HIP ARTHROPLASTY ANTERIOR APPROACH;  Surgeon: Ollen Gross, MD;  Location: [facility];  Service: Orthopedics;  Laterality: Right;    TOTAL KNEE ARTHROPLASTY Left 07/29/2012   Procedure: LEFT TOTAL KNEE ARTHROPLASTY;  Surgeon: Loanne Drilling, MD;  Location: [facility];  Service: Orthopedics;  Laterality: Left;   TOTAL KNEE ARTHROPLASTY Right 05/28/2015   Procedure: RIGHT TOTAL KNEE ARTHROPLASTY;  Surgeon: Ollen Gross, MD;  Location: [facility];  Service: Orthopedics;  Laterality: Right;   WISDOM TOOTH EXTRACTION  2008    Current Outpatient Medications  Medication Sig Dispense Refill   amLODipine (NORVASC) 5 MG tablet Take 1 tablet (5 mg total) by mouth daily. 90 tablet 3   diphenhydrAMINE (BENADRYL) 25 MG tablet Take 1 tablet (25 mg total) by mouth every 6 (six) hours as needed for up to 3 days. 100 tablet 0   EPINEPHrine 0.3 mg/0.3 mL IJ SOAJ injection Inject 0.3 mg into the muscle as needed for anaphylaxis. 2 each 0   hydrOXYzine (ATARAX) 25 MG tablet Take 1 tablet (25 mg total) by mouth 2 (two) times daily as needed. 60 tablet 2   montelukast (SINGULAIR) 10 MG tablet Take 1 tablet (10 mg total) by mouth at bedtime. 90 tablet 3   PREMARIN vaginal cream Place 1 Applicatorful vaginally once a week.   5   triamcinolone cream (KENALOG) 0.1 % Apply 1 application topically 2 (two) times daily. 30 g 0   No current facility-administered medications for this visit.  Allergies  Allergen Reactions   Nitrofuran Derivatives Other (See Comments)    Chest pain and elevated liver enzymes   Other     PT STATES THE SULFA AND PENICILLIN SHE TOOK MANY YEARS AGO BOTH HAD RED DYES--SO SHE WONDERED IF ALLERGIC TO RED DYES   Penicillins Hives    Has had penicillin since and has been fine.     Sulfonamide Derivatives Hives    Social History   Socioeconomic History   Marital status: Married    Spouse name: Not on file   Number of children: 2   Years of education: 12   Highest education level: Not on file  Occupational History    Comment: retired  Tobacco Use   Smoking status: Never   Smokeless tobacco: Never  Vaping Use   Vaping Use: Never used  Substance and Sexual Activity   Alcohol use: No    Alcohol/week: 0.0 standard  drinks of alcohol    Comment: no    Drug use: No   Sexual activity: Not Currently    Partners: Male    Birth control/protection: Surgical    Comment: TAH/BSO  Other Topics Concern   Not on file  Social History Narrative   [employer], married 1958 2 sons 1961 and 1963, 1 grandson 74. End of life issues - materials provided for contemplation. Still in the contemplative.   One grand son; 33    Social Determinants of Corporate investment banker Strain: Not on file  Food Insecurity: Not on file  Transportation Needs: Not on file  Physical Activity: Not on file  Stress: Not on file  Social Connections: Not on file  Intimate Partner Violence: Not on file    Family History  Problem Relation Age of Onset   Cancer Neg Hx        Breast/Colon   Diabetes Neg Hx    Heart disease Neg Hx        CAD/MI    Review of Systems:  As stated in the HPI and otherwise negative.   BP 122/66   Pulse 62   Ht 5\' 2"  (1.575 m)   Wt 160 lb (72.6 kg)   LMP 03/21/1983   SpO2 97%   BMI 29.26 kg/m   Physical Examination: General: Well developed, well nourished, NAD  HEENT: OP clear, mucus membranes moist  SKIN: warm, dry. No rashes. Neuro: No focal deficits  Musculoskeletal: Muscle strength 5/5 all ext  Psychiatric: Mood and affect normal  Neck: No JVD, no carotid bruits, no thyromegaly, no lymphadenopathy.  Lungs:Clear bilaterally, no wheezes, rhonci, crackles Cardiovascular: Regular rate and rhythm. Soft systolic murmur.  Abdomen:Soft. Bowel sounds present. Non-tender.  Extremities: No lower extremity edema. Pulses are 2 + in the bilateral DP/PT.  EKG:  EKG is ordered today. The ekg ordered today demonstrates Sinus  Recent Labs: 03/07/2021: TSH 2.75 10/11/2021: ALT 11; BUN 17; Creatinine, Ser 1.03; Hemoglobin 13.6; Platelets 350.0; Potassium 3.3; Sodium 140   Lipid Panel    Component Value Date/Time   CHOL 196 03/07/2021 1613   TRIG 230.0 (H) 03/07/2021 1613   HDL 50.90  03/07/2021 1613   CHOLHDL 4 03/07/2021 1613   VLDL 46.0 (H) 03/07/2021 1613   LDLCALC 134 (H) 01/13/2019 0903   LDLDIRECT 120.0 03/07/2021 1613     Wt Readings from Last 3 Encounters:  11/11/21 160 lb (72.6 kg)  10/11/21 161 lb (73 kg)  10/04/21 163 lb (73.9 kg)      Assessment and  Plan:   1. Cardiac murmur: Soft murmur on exam. Will arrange an echo to assess  2. HTN: BP controlled. Continue current therapy  3. PSVT: Rare palpitations   Labs/ tests ordered today include:   Orders Placed This Encounter  Procedures   EKG 12-Lead   ECHOCARDIOGRAM COMPLETE    Disposition:   F/U with me in one year.    Signed, Verne Carrow, MD 11/11/2021 10:33 AM    [facility] [address] [place], [place], [place]  [address] Phone: [phone]; Fax: [phone]

## 2021-11-23 DIAGNOSIS — T783XXD Angioneurotic edema, subsequent encounter: Secondary | ICD-10-CM | POA: Diagnosis not present

## 2021-11-23 DIAGNOSIS — R21 Rash and other nonspecific skin eruption: Secondary | ICD-10-CM | POA: Diagnosis not present

## 2021-11-25 ENCOUNTER — Ambulatory Visit (HOSPITAL_COMMUNITY): Payer: Medicare Other | Attending: Cardiovascular Disease

## 2021-11-25 DIAGNOSIS — R011 Cardiac murmur, unspecified: Secondary | ICD-10-CM | POA: Diagnosis present

## 2021-11-25 LAB — ECHOCARDIOGRAM COMPLETE
Area-P 1/2: 3.31 cm2
S' Lateral: 2.4 cm

## 2021-11-28 ENCOUNTER — Telehealth: Payer: Self-pay | Admitting: Cardiovascular Disease

## 2021-11-28 NOTE — Telephone Encounter (Signed)
Pt calling back for echo results  

## 2021-11-28 NOTE — Telephone Encounter (Signed)
Kaylee Hazel, MD  11/28/2021 11:07 AM EDT     Her heart is strong. The middle wall of the heart, the septum, is slightly thick which creates her murmur. No valve disease. Will follow with echo in 2-3 years. cdm   Patient aware of results.

## 2021-11-29 DIAGNOSIS — H40023 Open angle with borderline findings, high risk, bilateral: Secondary | ICD-10-CM | POA: Diagnosis not present

## 2021-12-29 DIAGNOSIS — M1612 Unilateral primary osteoarthritis, left hip: Secondary | ICD-10-CM | POA: Diagnosis not present

## 2021-12-29 DIAGNOSIS — M25552 Pain in left hip: Secondary | ICD-10-CM | POA: Diagnosis not present

## 2022-01-02 ENCOUNTER — Encounter: Payer: Self-pay | Admitting: Internal Medicine

## 2022-01-15 ENCOUNTER — Emergency Department (HOSPITAL_BASED_OUTPATIENT_CLINIC_OR_DEPARTMENT_OTHER)
Admission: EM | Admit: 2022-01-15 | Discharge: 2022-01-15 | Disposition: A | Discharge status: Home / Self Care | Payer: Medicare Other | Attending: Emergency Medicine | Admitting: Emergency Medicine

## 2022-01-15 ENCOUNTER — Emergency Department (HOSPITAL_BASED_OUTPATIENT_CLINIC_OR_DEPARTMENT_OTHER): Payer: Medicare Other | Admitting: Radiology

## 2022-01-15 ENCOUNTER — Other Ambulatory Visit: Payer: Self-pay

## 2022-01-15 DIAGNOSIS — J069 Acute upper respiratory infection, unspecified: Secondary | ICD-10-CM

## 2022-01-15 DIAGNOSIS — R0981 Nasal congestion: Secondary | ICD-10-CM | POA: Diagnosis present

## 2022-01-15 DIAGNOSIS — R059 Cough, unspecified: Secondary | ICD-10-CM | POA: Diagnosis not present

## 2022-01-15 MED ORDER — BENZONATATE 100 MG PO CAPS: 100.0000 mg | ORAL_CAPSULE | Freq: Three times a day (TID) | ORAL | 0 refills | Status: DC

## 2022-01-15 MED ORDER — ONDANSETRON 4 MG PO TBDP: ORAL_TABLET | ORAL | 0 refills | Status: DC

## 2022-01-15 NOTE — Discharge Instructions (Signed)
Your chest x-ray did not show pneumonia.  Take tylenol 2 pills 4 times a day.  Drink plenty of fluids.  Return for worsening shortness of breath, headache, confusion. Follow up with your family doctor.

## 2022-01-15 NOTE — ED Provider Notes (Signed)
[facility] DE[facility] Provider Note   CSN: [id number] Arrival date & time: 01/15/22  0749     History  Chief Complaint  Patient presents with   Cough    Kaylee Davis is a 81 y.o. female.  81 yo F with a chief complaints of cough congestion.  This has been going on for the past couple weeks or so.  Now feels like it is getting into her lungs.  Making her have trouble breathing especially when she lies back flat trying to sleep.  Had difficulty sleeping all night last night.  Every time she laid back felt like there was a tickle in her chest and made her cough.  She feels like it is dripping down the back of her throat worse on the left than the right.  No known sick contacts no recent travel no rashes no tick bites.   Cough      Home Medications Prior to Admission medications   Medication Sig Start Date End Date Taking? Authorizing Provider  benzonatate (TESSALON) 100 MG capsule Take 1 capsule (100 mg total) by mouth every 8 (eight) hours. 01/15/22  Yes Deno Etienne, DO  ondansetron (ZOFRAN-ODT) 4 MG disintegrating tablet 4mg  ODT q4 hours prn nausea/vomit 01/15/22  Yes Deno Etienne, DO  amLODipine (NORVASC) 5 MG tablet Take 1 tablet (5 mg total) by mouth daily. 03/07/21   Hoyt Koch, MD  diphenhydrAMINE (BENADRYL) 25 MG tablet Take 1 tablet (25 mg total) by mouth every 6 (six) hours as needed for up to 3 days. 10/06/21 7/79/39  Campbell Stall P, DO  EPINEPHrine 0.3 mg/0.3 mL IJ SOAJ injection Inject 0.3 mg into the muscle as needed for anaphylaxis. 0/30/09   Lianne Cure, DO  hydrOXYzine (ATARAX) 25 MG tablet Take 1 tablet (25 mg total) by mouth 2 (two) times daily as needed. 03/07/21   Hoyt Koch, MD  montelukast (SINGULAIR) 10 MG tablet Take 1 tablet (10 mg total) by mouth at bedtime. 08/02/21   Biagio Borg, MD  PREMARIN vaginal cream Place 1 Applicatorful vaginally once a week.  11/18/17   [provider]  triamcinolone cream  (KENALOG) 0.1 % Apply 1 application topically 2 (two) times daily. 05/10/21 05/10/22  Biagio Borg, MD  esomeprazole (NEXIUM) 20 MG capsule Take 1 capsule (20 mg total) by mouth every morning. 05/20/15 08/16/20  Hoyt Koch, MD      Allergies    Nitrofuran derivatives, Other, Penicillins, and Sulfonamide derivatives    Review of Systems   Review of Systems  Respiratory:  Positive for cough.     Physical Exam Updated Vital Signs BP (!) 154/81   Pulse 78   Temp 98 F (36.7 C)   Resp 20   Ht 5\' 2"  (1.575 m)   Wt 71.2 kg   LMP 03/21/1983   SpO2 97%   BMI 28.72 kg/m  Physical Exam Vitals and nursing note reviewed.  Constitutional:      General: She is not in acute distress.    Appearance: She is well-developed. She is not diaphoretic.  HENT:     Davis: Normocephalic and atraumatic.     Comments: Swollen turbinates, posterior nasal drip, no noted sinus ttp, TM with bilateral effusion without erythema bulging or distortion of landmarks.   Eyes:     Pupils: Pupils are equal, round, and reactive to light.  Cardiovascular:     Rate and Rhythm: Normal rate and regular rhythm.     Heart  sounds: No murmur heard.    No friction rub. No gallop.  Pulmonary:     Effort: Pulmonary effort is normal.     Breath sounds: No wheezing or rales.  Abdominal:     General: There is no distension.     Palpations: Abdomen is soft.     Tenderness: There is no abdominal tenderness.  Musculoskeletal:        General: No tenderness.     Cervical back: Normal range of motion and neck supple.  Skin:    General: Skin is warm and dry.  Neurological:     Mental Status: She is alert and oriented to person, place, and time.  Psychiatric:        Behavior: Behavior normal.     ED Results / Procedures / Treatments   Labs (all labs ordered are listed, but only abnormal results are displayed) Labs Reviewed - No data to display  EKG None  Radiology DG Chest 2 View  Result Date:  01/15/2022 CLINICAL DATA:  81 year old female with history of cough for 1 day. EXAM: CHEST - 2 VIEW COMPARISON:  Chest x-ray 02/16/2016. FINDINGS: Lung volumes are normal. No consolidative airspace disease. No pleural effusions. No pneumothorax. No pulmonary nodule or mass noted. Pulmonary vasculature and the cardiomediastinal silhouette are within normal limits. Atherosclerotic calcifications are noted in the thoracic aorta. IMPRESSION: 1.  No radiographic evidence of acute cardiopulmonary disease. 2. Aortic atherosclerosis. Electronically Signed   By: Vinnie Langton M.D.   On: 01/15/2022 08:37    Procedures Procedures    Medications Ordered in ED Medications - No data to display  ED Course/ Medical Decision Making/ A&P                           Medical Decision Making Amount and/or Complexity of Data Reviewed Radiology: ordered.  Risk Prescription drug management.   81 yo F with a chief complaints of cough and congestion.  Going on for about a week now.  Patient now having coughing worse with laying back flat.  Clinically the patient has a URI.  She has clear lung sounds.  Will obtain a chest x-ray to assess for pneumonia.  Chest x-ray independently interpreted by me without focal infiltrate and pneumothorax.  We will discharge the patient home.  Supportive treatment.  PCP follow-up.  8:54 AM:  I have discussed the diagnosis/risks/treatment options with the patient and family.  Evaluation and diagnostic testing in the emergency department does not suggest an emergent condition requiring admission or immediate intervention beyond what has been performed at this time.  They will follow up with PCP. We also discussed returning to the ED immediately if new or worsening sx occur. We discussed the sx which are most concerning (e.g., sudden worsening pain, fever, inability to tolerate by mouth) that necessitate immediate return. Medications administered to the patient during their visit and any  new prescriptions provided to the patient are listed below.  Medications given during this visit Medications - No data to display   The patient appears reasonably screen and/or stabilized for discharge and I doubt any other medical condition or other [facility] requiring further screening, evaluation, or treatment in the ED at this time prior to discharge.          Final Clinical Impression(s) / ED Diagnoses Final diagnoses:  Upper respiratory tract infection, unspecified type    Rx / DC Orders ED Discharge Orders  Ordered    benzonatate (TESSALON) 100 MG capsule  Every 8 hours        01/15/22 0851    ondansetron (ZOFRAN-ODT) 4 MG disintegrating tablet        01/15/22 York, Elbe, DO 01/15/22 [phone]

## 2022-01-15 NOTE — ED Triage Notes (Signed)
Patient arrives ambulatory to triage with reports of a deep dry cough x1 day. Patient also reports recovering from a a cold she had last week.

## 2022-01-15 NOTE — ED Notes (Signed)
Pt dc home with husband. Opportunities given for questions. Pt verbalizes understanding. Leanne Chang, RN

## 2022-01-18 ENCOUNTER — Telehealth: Payer: Self-pay

## 2022-01-18 NOTE — Telephone Encounter (Signed)
Transition Care Management Follow-up Telephone Call Date of discharge and from where: 01/15/2022 [facility] How have you been since you were released from the hospital? Has not been feeling good pt states that she has really bad coughing spells. Any questions or concerns? No  Items Reviewed: Did the pt receive and understand the discharge instructions provided? Yes  Medications obtained and verified? Yes  Other? No  Any new allergies since your discharge? No  Dietary orders reviewed? No Do you have support at home? Yes   Home Care and Equipment/Supplies: Were home health services ordered? no If so, what is the name of the agency? N/a  Has the agency set up a time to come to the patient's home? no Were any new equipment or medical supplies ordered?  No What is the name of the medical supply agency? N/a Were you able to get the supplies/equipment? no Do you have any questions related to the use of the equipment or supplies? No  Functional Questionnaire: (I = Independent and D = Dependent) ADLs: I  Bathing/Dressing- I  Meal Prep- I  Eating- I  Maintaining continence- I  Transferring/Ambulation- I  Managing Meds- I  Follow up appointments reviewed:  PCP Hospital f/u appt confirmed? Yes Scheduled to see Dr Sharlet Salina on 01/27/2022 @ [facility] f/u appt confirmed? No  Scheduled to see n/a on n/a @ n/a. Are transportation arrangements needed? No  If their condition worsens, is the pt aware to call PCP or go to the Emergency Dept.? Yes Was the patient provided with contact information for the PCP's office or ED? Yes Was to pt encouraged to call back with questions or concerns? Yes

## 2022-01-27 ENCOUNTER — Ambulatory Visit (INDEPENDENT_AMBULATORY_CARE_PROVIDER_SITE_OTHER): Payer: Medicare Other | Admitting: Internal Medicine

## 2022-01-27 ENCOUNTER — Encounter: Payer: Self-pay | Admitting: Internal Medicine

## 2022-01-27 VITALS — BP 128/80 | HR 86 | Temp 97.5°F | Ht 62.0 in | Wt 162.0 lb

## 2022-01-27 DIAGNOSIS — Z01818 Encounter for other preprocedural examination: Secondary | ICD-10-CM

## 2022-01-27 NOTE — Progress Notes (Signed)
   Subjective:   Patient ID: Kaylee Davis, female    DOB: 01-09-1941, 81 y.o.   MRN: [id number]  HPI The patient is an 81 YO female coming in for ER follow up and pre-op assessment. No chest pains.  Review of Systems  Constitutional: Negative.   HENT: Negative.    Eyes: Negative.   Respiratory:  Negative for cough, chest tightness and shortness of breath.   Cardiovascular:  Negative for chest pain, palpitations and leg swelling.  Gastrointestinal:  Negative for abdominal distention, abdominal pain, constipation, diarrhea, nausea and vomiting.  Musculoskeletal:  Positive for arthralgias.  Skin: Negative.   Neurological: Negative.   Psychiatric/Behavioral: Negative.      Objective:  Physical Exam Constitutional:      Appearance: She is well-developed.  HENT:     Head: Normocephalic and atraumatic.  Cardiovascular:     Rate and Rhythm: Normal rate and regular rhythm.  Pulmonary:     Effort: Pulmonary effort is normal. No respiratory distress.     Breath sounds: Normal breath sounds. No wheezing or rales.  Abdominal:     General: Bowel sounds are normal. There is no distension.     Palpations: Abdomen is soft.     Tenderness: There is no abdominal tenderness. There is no rebound.  Musculoskeletal:        General: Tenderness present.     Cervical back: Normal range of motion.  Skin:    General: Skin is warm and dry.  Neurological:     Mental Status: She is alert and oriented to person, place, and time.     Coordination: Coordination normal.     Vitals:   01/27/22 1101  BP: 128/80  Pulse: 86  Temp: (!) 97.5 F (36.4 C)  TempSrc: Oral  SpO2: 93%  Weight: 162 lb (73.5 kg)  Height: 5\' 2"  (1.575 m)    Assessment & Plan:  Visit time 25 minutes in face to face communication with patient and coordination of care, additional 5 minutes spent in record review, coordination or care, ordering tests, communicating/referring to other healthcare professionals, documenting  in medical records all on the same day of the visit for total time 30 minutes spent on the visit.

## 2022-01-27 NOTE — Assessment & Plan Note (Signed)
Reviewed recent EKG and BP normal. No chest pains or SOB to suggest need for further cardiac workup. Low risk for upcoming hip replacement. Talked with patient and husband about need for bowel regimen while on opioid medication post-op.

## 2022-02-23 ENCOUNTER — Telehealth: Payer: Self-pay | Admitting: Internal Medicine

## 2022-02-23 NOTE — Telephone Encounter (Signed)
Received and placed inside Dr crawford's office box

## 2022-02-23 NOTE — Telephone Encounter (Signed)
For our records:  We have received Pre-Op PW for the pt from [facility] and it has been placed in Dr. Frutoso Chase boxes.  Upon completion please fax to:  [phone]

## 2022-02-27 NOTE — Telephone Encounter (Signed)
This has been faxed back over

## 2022-02-27 NOTE — Telephone Encounter (Signed)
[facility] has sent another fax with surgical clearance paperwork. Placed in Dr. Frutoso Chase box up front.

## 2022-03-09 ENCOUNTER — Other Ambulatory Visit: Payer: Self-pay | Admitting: Internal Medicine

## 2022-03-10 ENCOUNTER — Ambulatory Visit (INDEPENDENT_AMBULATORY_CARE_PROVIDER_SITE_OTHER): Payer: Medicare Other

## 2022-03-10 VITALS — Ht 62.0 in

## 2022-03-10 DIAGNOSIS — Z0000 Encounter for general adult medical examination without abnormal findings: Secondary | ICD-10-CM | POA: Diagnosis not present

## 2022-03-10 NOTE — Patient Instructions (Addendum)
Kaylee Davis , Thank you for taking time to come for your Medicare Wellness Visit. I appreciate your ongoing commitment to your health goals. Please review the following plan we discussed and let me know if I can assist you in the future.   These are the goals we discussed:  Goals      My goal for 2024 is to have left hip surgery and keep on walking.        This is a list of the screening recommended for you and due dates:  Health Maintenance  Topic Date Due   DEXA scan (bone density measurement)  Never done   COVID-19 Vaccine (8 - 2023-24 season) 01/31/2022   Medicare Annual Wellness Visit  03/11/2023   DTaP/Tdap/Td vaccine (3 - Td or Tdap) 01/12/2029   Pneumonia Vaccine  Completed   Flu Shot  Completed   Zoster (Shingles) Vaccine  Completed   HPV Vaccine  Aged Out   Immunization History  Administered Date(s) Administered   Fluad Quad(high Dose 65+) 01/13/2019, 01/15/2020, 02/03/2021, 12/06/2021   Influenza Split 01/02/2011, 01/04/2012   Influenza Whole 12/11/2008, 12/13/2009   Influenza, High Dose Seasonal PF 01/15/2015, 12/14/2015, 01/08/2017, 01/10/2018   Influenza,inj,Quad PF,6+ Mos 12/17/2012, 01/12/2014   PFIZER(Purple Top)SARS-COV-2 Vaccination 05/18/2019, 06/17/2019   Pneumococcal Conjugate-13 02/05/2013   Pneumococcal Polysaccharide-23 12/06/2006, 01/15/2020   Respiratory Syncytial Virus Vaccine,Recomb Aduvanted(Arexvy) 12/05/2021   Td 12/11/2008   Tdap 01/13/2019   Unspecified SARS-COV-2 Vaccination 12/06/2021   Zoster Recombinat (Shingrix) 08/04/2016, 11/30/2016   Zoster, Live 03/21/2005   Advanced directives: Yes; patient has a Living Will  Conditions/risks identified: Yes  Next appointment: Follow up in one year for your annual wellness visit.   Preventive Care 68 Years and Older, Female Preventive care refers to lifestyle choices and visits with your health care provider that can promote health and wellness. What does preventive care include? A yearly  physical exam. This is also called an annual well check. Dental exams once or twice a year. Routine eye exams. Ask your health care provider how often you should have your eyes checked. Personal lifestyle choices, including: Daily care of your teeth and gums. Regular physical activity. Eating a healthy diet. Avoiding tobacco and drug use. Limiting alcohol use. Practicing safe sex. Taking low-dose aspirin every day. Taking vitamin and mineral supplements as recommended by your health care provider. What happens during an annual well check? The services and screenings done by your health care provider during your annual well check will depend on your age, overall health, lifestyle risk factors, and family history of disease. Counseling  Your health care provider may ask you questions about your: Alcohol use. Tobacco use. Drug use. Emotional well-being. Home and relationship well-being. Sexual activity. Eating habits. History of falls. Memory and ability to understand (cognition). Work and work Astronomer. Reproductive health. Screening  You may have the following tests or measurements: Height, weight, and BMI. Blood pressure. Lipid and cholesterol levels. These may be checked every 5 years, or more frequently if you are over 51 years old. Skin check. Lung cancer screening. You may have this screening every year starting at age 78 if you have a 30-pack-year history of smoking and currently smoke or have quit within the past 15 years. Fecal occult blood test (FOBT) of the stool. You may have this test every year starting at age 36. Flexible sigmoidoscopy or colonoscopy. You may have a sigmoidoscopy every 5 years or a colonoscopy every 10 years starting at age 34. Hepatitis C blood  test. Hepatitis B blood test. Sexually transmitted disease (STD) testing. Diabetes screening. This is done by checking your blood sugar (glucose) after you have not eaten for a while (fasting). You may  have this done every 1-3 years. Bone density scan. This is done to screen for osteoporosis. You may have this done starting at age 67. Mammogram. This may be done every 1-2 years. Talk to your health care provider about how often you should have regular mammograms. Talk with your health care provider about your test results, treatment options, and if necessary, the need for more tests. Vaccines  Your health care provider may recommend certain vaccines, such as: Influenza vaccine. This is recommended every year. Tetanus, diphtheria, and acellular pertussis (Tdap, Td) vaccine. You may need a Td booster every 10 years. Zoster vaccine. You may need this after age 30. Pneumococcal 13-valent conjugate (PCV13) vaccine. One dose is recommended after age 70. Pneumococcal polysaccharide (PPSV23) vaccine. One dose is recommended after age 57. Talk to your health care provider about which screenings and vaccines you need and how often you need them. This information is not intended to replace advice given to you by your health care provider. Make sure you discuss any questions you have with your health care provider. Document Released: 04/02/2015 Document Revised: 11/24/2015 Document Reviewed: 01/05/2015 Elsevier Interactive Patient Education  2017 [employer].  Fall Prevention in the Home Falls can cause injuries. They can happen to people of all ages. There are many things you can do to make your home safe and to help prevent falls. What can I do on the outside of my home? Regularly fix the edges of walkways and driveways and fix any cracks. Remove anything that might make you trip as you walk through a door, such as a raised step or threshold. Trim any bushes or trees on the path to your home. Use bright outdoor lighting. Clear any walking paths of anything that might make someone trip, such as rocks or tools. Regularly check to see if handrails are loose or broken. Make sure that both sides of any  steps have handrails. Any raised decks and porches should have guardrails on the edges. Have any leaves, snow, or ice cleared regularly. Use sand or salt on walking paths during winter. Clean up any spills in your garage right away. This includes oil or grease spills. What can I do in the bathroom? Use night lights. Install grab bars by the toilet and in the tub and shower. Do not use towel bars as grab bars. Use non-skid mats or decals in the tub or shower. If you need to sit down in the shower, use a plastic, non-slip stool. Keep the floor dry. Clean up any water that spills on the floor as soon as it happens. Remove soap buildup in the tub or shower regularly. Attach bath mats securely with double-sided non-slip rug tape. Do not have throw rugs and other things on the floor that can make you trip. What can I do in the bedroom? Use night lights. Make sure that you have a light by your bed that is easy to reach. Do not use any sheets or blankets that are too big for your bed. They should not hang down onto the floor. Have a firm chair that has side arms. You can use this for support while you get dressed. Do not have throw rugs and other things on the floor that can make you trip. What can I do in the kitchen? Clean  up any spills right away. Avoid walking on wet floors. Keep items that you use a lot in easy-to-reach places. If you need to reach something above you, use a strong step stool that has a grab bar. Keep electrical cords out of the way. Do not use floor polish or wax that makes floors slippery. If you must use wax, use non-skid floor wax. Do not have throw rugs and other things on the floor that can make you trip. What can I do with my stairs? Do not leave any items on the stairs. Make sure that there are handrails on both sides of the stairs and use them. Fix handrails that are broken or loose. Make sure that handrails are as long as the stairways. Check any carpeting to  make sure that it is firmly attached to the stairs. Fix any carpet that is loose or worn. Avoid having throw rugs at the top or bottom of the stairs. If you do have throw rugs, attach them to the floor with carpet tape. Make sure that you have a light switch at the top of the stairs and the bottom of the stairs. If you do not have them, ask someone to add them for you. What else can I do to help prevent falls? Wear shoes that: Do not have high heels. Have rubber bottoms. Are comfortable and fit you well. Are closed at the toe. Do not wear sandals. If you use a stepladder: Make sure that it is fully opened. Do not climb a closed stepladder. Make sure that both sides of the stepladder are locked into place. Ask someone to hold it for you, if possible. Clearly mark and make sure that you can see: Any grab bars or handrails. First and last steps. Where the edge of each step is. Use tools that help you move around (mobility aids) if they are needed. These include: Canes. Walkers. Scooters. Crutches. Turn on the lights when you go into a dark area. Replace any light bulbs as soon as they burn out. Set up your furniture so you have a clear path. Avoid moving your furniture around. If any of your floors are uneven, fix them. If there are any pets around you, be aware of where they are. Review your medicines with your doctor. Some medicines can make you feel dizzy. This can increase your chance of falling. Ask your doctor what other things that you can do to help prevent falls. This information is not intended to replace advice given to you by your health care provider. Make sure you discuss any questions you have with your health care provider. Document Released: 12/31/2008 Document Revised: 08/12/2015 Document Reviewed: 04/10/2014 Elsevier Interactive Patient Education  2017 [employer].

## 2022-03-10 NOTE — Progress Notes (Signed)
Virtual Visit via Telephone Note  I connected with  Kaylee Davis on 03/10/22 at 10:00 AM EST by telephone and verified that I am speaking with the correct person using two identifiers.  Location: Patient: Home Provider: [facility] Persons participating in the virtual visit: patient/Nurse Health Advisor   I discussed the limitations, risks, security and privacy concerns of performing an evaluation and management service by telephone and the availability of in person appointments. The patient expressed understanding and agreed to proceed.  Interactive audio and video telecommunications were attempted between this nurse and patient, however failed, due to patient having technical difficulties OR patient did not have access to video capability.  We continued and completed visit with audio only.  Some vital signs may be absent or patient reported.   Mickeal Needy, LPN  Subjective:   Kaylee Davis is a 81 y.o. female who presents for Medicare Annual (Subsequent) preventive examination.  Review of Systems     Cardiac Risk Factors include: advanced age (>35men, >88 women);family history of premature cardiovascular disease;hypertension     Objective:    Today's Vitals   03/10/22 1001  Height:  (1.575 m)  PainSc: 0-No pain   Body mass index is 29.63 kg/m.     01/15/2022    8:06 AM 10/06/2021   10:25 AM 03/06/2018   12:00 PM 03/01/2018    2:16 PM 02/16/2016   12:04 AM 10/11/2015    8:34 AM 05/28/2015   11:45 AM  Advanced Directives  Does Patient Have a Medical Advance Directive? Yes Yes Yes Yes No No No  Type of Advance Directive Living will  Healthcare Power of [place];Living will Healthcare Power of [place];Living will     Does patient want to make changes to medical advance directive?  No - Patient declined No - Patient declined No - Patient declined     Copy of Healthcare Power of Attorney in Chart?   No - copy requested No - copy requested      Would patient like information on creating a medical advance directive?     No - Patient declined Yes - Educational materials given No - patient declined information    Current Medications (verified) Outpatient Encounter Medications as of 03/10/2022  Medication Sig   amLODipine (NORVASC) 5 MG tablet Take 1 tablet (5 mg total) by mouth daily.   benzonatate (TESSALON) 100 MG capsule Take 1 capsule (100 mg total) by mouth every 8 (eight) hours. (Patient not taking: Reported on 01/27/2022)   diphenhydrAMINE (BENADRYL) 25 MG tablet Take 1 tablet (25 mg total) by mouth every 6 (six) hours as needed for up to 3 days.   EPINEPHrine 0.3 mg/0.3 mL IJ SOAJ injection Inject 0.3 mg into the muscle as needed for anaphylaxis. (Patient not taking: Reported on 01/27/2022)   hydrOXYzine (ATARAX) 25 MG tablet Take 1 tablet (25 mg total) by mouth 2 (two) times daily as needed. (Patient not taking: Reported on 01/27/2022)   montelukast (SINGULAIR) 10 MG tablet Take 1 tablet (10 mg total) by mouth at bedtime. (Patient not taking: Reported on 01/27/2022)   ondansetron (ZOFRAN-ODT) 4 MG disintegrating tablet  ODT q4 hours prn nausea/vomit (Patient not taking: Reported on 01/27/2022)   PREMARIN vaginal cream Place 1 Applicatorful vaginally once a week.    triamcinolone cream (KENALOG) 0.1 % Apply 1 application topically 2 (two) times daily. (Patient not taking: Reported on 01/27/2022)   [DISCONTINUED] esomeprazole (NEXIUM) 20 MG capsule Take 1 capsule (20 mg total) by  mouth every morning.   No facility-administered encounter medications on file as of 03/10/2022.    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 Triggered by elevated heart rate   Hypertension    Imbalance    Migraine headache    much reduced frequency   Tachycardia    Episodic   Tingling    left foot    TMJ (temporomandibular joint disorder)    PAST HX GRINDING TEETH --BUT NO LONGER A PROBLEM   Wears glasses    Past Surgical History:  Procedure Laterality Date   ABDOMINAL HYSTERECTOMY  1985   TAH/BSO--for ?fibroids   APPENDECTOMY     ARTHROPLASTY     left Knee    cyst removed      from mouth   KNEE ARTHROSCOPY Left 10/2005   LAPAROSCOPIC APPENDECTOMY  02/2000   REPLACEMENT TOTAL KNEE Right    TONSILLECTOMY     TOTAL HIP ARTHROPLASTY Right 03/06/2018   Procedure: TOTAL HIP ARTHROPLASTY ANTERIOR APPROACH;  Surgeon: Ollen Gross, MD;  Location: [facility];  Service: Orthopedics;  Laterality: Right;    TOTAL KNEE ARTHROPLASTY Left 07/29/2012   Procedure: LEFT TOTAL KNEE ARTHROPLASTY;  Surgeon: Loanne Drilling, MD;  Location: [facility];  Service: Orthopedics;  Laterality: Left;   TOTAL KNEE ARTHROPLASTY Right 05/28/2015   Procedure: RIGHT TOTAL KNEE ARTHROPLASTY;  Surgeon: Ollen Gross, MD;  Location: [facility];  Service: Orthopedics;  Laterality: Right;   WISDOM TOOTH EXTRACTION  2008   Family History  Problem Relation Age of Onset   Cancer Neg Hx        Breast/Colon   Diabetes Neg Hx    Heart disease Neg Hx        CAD/MI   Social History   Socioeconomic History   Marital status: Married    Spouse name: Not on file   Number of children: 2   Years of education: 12   Highest education level: Not on file  Occupational History    Comment: retired  Tobacco Use   Smoking status: Never   Smokeless tobacco: Never  Vaping Use   Vaping Use: Never used  Substance and Sexual Activity   Alcohol use: No     Alcohol/week: 0.0 standard drinks of alcohol    Comment: no    Drug use: No   Sexual activity: Not Currently    Partners: Male    Birth control/protection: Surgical    Comment: TAH/BSO  Other Topics Concern   Not on file  Social History Narrative   [employer], married 1958 2 sons 1961 and 1963, 1 grandson 48. End of life issues - materials provided for contemplation. Still in the contemplative.   One grand son; 2    Social Determinants of Health   Financial Resource Strain: Low Risk  (03/10/2022)   Overall Financial Resource Strain (CARDIA)    Difficulty of Paying Living Expenses: Not hard at all  Food  Insecurity: No Food Insecurity (03/10/2022)   Hunger Vital Sign    Worried About Running Out of Food in the Last Year: Never true    Ran Out of Food in the Last Year: Never true  Transportation Needs: No Transportation Needs (03/10/2022)   PRAPARE - Administrator, Civil ServiceTransportation    Lack of Transportation (Medical): No    Lack of Transportation (Non-Medical): No  Physical Activity: Sufficiently Active (03/10/2022)   Exercise Vital Sign    Days of Exercise per Week: 5 days    Minutes of Exercise per Session: 30 min  Stress: No Stress Concern Present (03/10/2022)   [employer] - Occupational Stress Questionnaire    Feeling of Stress : Not at all  Social Connections: Socially Integrated (03/10/2022)   Social Connection and Isolation Panel [NHANES]    Frequency of Communication with Friends and Family: More than three times a week    Frequency of Social Gatherings with Friends and Family: More than three times a week    Attends Religious Services: More than 4 times per year    Active Member of [employer]ubs or Organizations: Yes    Attends Engineer, structuralClub or Organization Meetings: More than 4 times per year    Marital Status: Married    Tobacco Counseling Counseling given: Not Answered   Clinical Intake:  Pre-visit preparation completed: Yes  Pain : No/denies  pain Pain Score: 0-No pain     BMI - recorded: 29.63 (01/27/2022) Nutritional Risks: None Diabetes: No  How often do you need to have someone help you when you read instructions, pamphlets, or other written materials from your doctor or pharmacy?: 1 - Never What is the last grade level you completed in school?: HSG  Diabetic? no  Interpreter Needed?: No  Information entered by :: Kaylee CassetteShenika Shiryl Ruddy, LPN.   Activities of Daily Living    03/10/2022   10:12 AM  In your present state of health, do you have any difficulty performing the following activities:  Hearing? 0  Vision? 0  Difficulty concentrating or making decisions? 0  Walking or climbing stairs? 1  Comment uses a cane  Dressing or bathing? 0  Doing errands, shopping? 0  Preparing Food and eating ? N  Using the Toilet? N  In the past six months, have you accidently leaked urine? N  Do you have problems with loss of bowel control? N  Managing your Medications? N  Managing your Finances? N  Housekeeping or managing your Housekeeping? N    Patient Care Team: Myrlene Brokerrawford, Elizabeth A, MD as PCP - General (Internal Medicine) Kathleene HazelMcAlhany, Christopher D, MD as PCP - Cardiology (Cardiology) Gladys Dammeerry, Sharon Romaine (Gynecology) Ollen GrossAluisio, Frank, MD (Orthopedic Surgery)  Indicate any recent Medical Services you may have received from other than [facility] providers in the past year (date may be approximate).     Assessment:   This is a routine wellness examination for Kaylee DroneBrenda.  Hearing/Vision screen Hearing Screening - Comments:: Denies hearing difficulties   Vision Screening - Comments:: Wears rx glasses - up to date with routine eye exams with Dr. London SheerPeter Dunn   Dietary issues and exercise activities discussed: Current Exercise Habits: Home exercise routine, Type of exercise: walking, Time (Minutes): 30, Frequency (Times/Week): 5, Weekly Exercise (Minutes/Week): 150, Intensity: Mild, Exercise limited by: orthopedic  condition(s)   Goals Addressed             This Visit's Progress    My goal for 2024 is to have left hip surgery and keep on  walking.        Depression Screen    03/10/2022   10:07 AM 01/27/2022   11:10 AM 05/10/2021    9:22 AM 03/07/2021    3:48 PM 01/15/2020    1:13 PM 01/13/2019    8:27 AM 01/08/2017    3:12 PM  PHQ 2/9 Scores  PHQ - 2 Score 0 0 5 0 0 0 0  PHQ- 9 Score 0 0 12        Fall Risk    03/10/2022   10:04 AM 01/27/2022   11:09 AM 05/10/2021    9:22 AM 03/07/2021    3:48 PM 01/15/2020    1:13 PM  Fall Risk   Falls in the past year? 0 0 0 0 0  Number falls in past yr: 0 0 0 0   Injury with Fall? 0 0 1 0   Risk for fall due to : No Fall Risks      Follow up Falls prevention discussed Falls evaluation completed       FALL RISK PREVENTION PERTAINING TO THE HOME:  Any stairs in or around the home? Yes  has a stairlft If so, are there any without handrails? No  Home free of loose throw rugs in walkways, pet beds, electrical cords, etc? Yes  Adequate lighting in your home to reduce risk of falls? Yes   ASSISTIVE DEVICES UTILIZED TO PREVENT FALLS:  Life alert? No  Use of a cane, walker or w/c? Yes  Grab bars in the bathroom? No  Shower chair or bench in shower? Yes  Elevated toilet seat or a handicapped toilet? Yes   TIMED UP AND GO: Phone Visit  Was the test performed? No .   Cognitive Function:        03/10/2022   10:07 AM  6CIT Screen  What Year? 0 points  What month? 0 points  What time? 0 points  Count back from 20 0 points  Months in reverse 0 points  Repeat phrase 0 points  Total Score 0 points    Immunizations Immunization History  Administered Date(s) Administered   Fluad Quad(high Dose 65+) 01/13/2019, 01/15/2020, 02/03/2021, 12/06/2021   Influenza Split 01/02/2011, 01/04/2012   Influenza Whole 12/11/2008, 12/13/2009   Influenza, High Dose Seasonal PF 01/15/2015, 12/14/2015, 01/08/2017, 01/10/2018   Influenza,inj,Quad  PF,6+ Mos 12/17/2012, 01/12/2014   PFIZER(Purple Top)SARS-COV-2 Vaccination 05/18/2019, 06/17/2019   Pneumococcal Conjugate-13 02/05/2013   Pneumococcal Polysaccharide-23 12/06/2006, 01/15/2020   Respiratory Syncytial Virus Vaccine,Recomb Aduvanted(Arexvy) 12/05/2021   Td 12/11/2008   Tdap 01/13/2019   Unspecified SARS-COV-2 Vaccination 12/06/2021   Zoster Recombinat (Shingrix) 08/04/2016, 11/30/2016   Zoster, Live 03/21/2005    TDAP status: Up to date  Flu Vaccine status: Up to date  Pneumococcal vaccine status: Up to date  Covid-19 vaccine status: Completed vaccines  Qualifies for Shingles Vaccine? Yes   Zostavax completed Yes   Shingrix Completed?: Yes  Screening Tests Health Maintenance  Topic Date Due   DEXA SCAN  Never done   COVID-19 Vaccine (4 - 2023-24 season) 01/31/2022   Medicare Annual Wellness (AWV)  03/11/2023   DTaP/Tdap/Td (3 - Td or Tdap) 01/12/2029   Pneumonia Vaccine 26+ Years old  Completed   INFLUENZA VACCINE  Completed   Zoster Vaccines- Shingrix  Completed   HPV VACCINES  Aged Out    Health Maintenance  Health Maintenance Due  Topic Date Due   DEXA SCAN  Never done   COVID-19 Vaccine (4 - 2023-24 season) 01/31/2022  Colorectal cancer screening: No longer required.   Mammogram status: No longer required due to patient declined.  Bone Density status: never done  Lung Cancer Screening: (Low Dose CT Chest recommended if Age 23-80 years, 30 pack-year currently smoking OR have quit w/in 15years.) does not qualify.   Lung Cancer Screening Referral: no  Additional Screening:  Hepatitis C Screening: does not qualify; Completed no  Vision Screening: Recommended annual ophthalmology exams for early detection of glaucoma and other disorders of the eye. Is the patient up to date with their annual eye exam?  Yes  Who is the provider or what is the name of the office in which the patient attends annual eye exams? London Sheer, OD. If pt is not  established with a provider, would they like to be referred to a provider to establish care? No .   Dental Screening: Recommended annual dental exams for proper oral hygiene  Community Resource Referral / Chronic Care Management: CRR required this visit?  No   CCM required this visit?  No      Plan:     I have personally reviewed and noted the following in the patient's chart:   Medical and social history Use of alcohol, tobacco or illicit drugs  Current medications and supplements including opioid prescriptions. Patient is not currently taking opioid prescriptions.. Functional ability and status Nutritional status Physical activity Advanced directives List of other physicians Hospitalizations, surgeries, and ER visits in previous 12 months Vitals Screenings to include cognitive, depression, and falls Referrals and appointments  In addition, I have reviewed and discussed with patient certain preventive protocols, quality metrics, and best practice recommendations. A written personalized care plan for preventive services as well as general preventive health recommendations were provided to patient.     Mickeal Needy, LPN   [id number]   Nurse Notes: N/A

## 2022-03-28 NOTE — H&P (Signed)
TOTAL HIP ADMISSION H&P  Patient is admitted for left total hip arthroplasty.  Subjective:  Chief Complaint: Left hip pain  HPI: Kaylee Davis, 82 y.o. female, has a history of pain and functional disability in the left hip due to arthritis and patient has failed non-surgical conservative treatments for greater than 12 weeks to include use of assistive devices and activity modification. Onset of symptoms was gradual, starting 4 years ago with gradually worsening course since that time. The patient noted no past surgery on the left hip. Patient currently rates pain in the left hip at 8 out of 10 with activity. Patient has worsening of pain with activity and weight bearing and pain that interfers with activities of daily living. Patient has evidence of subchondral cysts, subchondral sclerosis, periarticular osteophytes, and joint space narrowing by imaging studies. This condition presents safety issues increasing the risk of falls. There is no current active infection.  Patient Active Problem List   Diagnosis Date Noted   Pre-op evaluation 01/27/2022   Angioedema 08/02/2021   Rash 05/10/2021   Vitamin D deficiency 05/10/2021   B12 deficiency 05/10/2021   Hyperglycemia 05/10/2021   Adjustment disorder with mixed anxiety and depressed mood 08/24/2020   Urinary frequency 08/24/2020   PVC (premature ventricular contraction) 01/15/2020   Hyperlipidemia with target LDL less than 130 01/15/2020   CKD (chronic kidney disease) stage 3, GFR 30-59 ml/min (HCC) 01/15/2020   OA (osteoarthritis) of hip 03/06/2018   OA (osteoarthritis) of knee 05/28/2015   Routine health maintenance 02/03/2011   Essential hypertension 02/26/2007   PSVT 02/26/2007   GERD (gastroesophageal reflux disease) 02/26/2007    Past Medical History:  Diagnosis Date   Arthritis    OA BOTH KNEES; OCCAS PAIN LEFT SHOULDER - HX OF ROTATOR CUFF PROBLEM   Dyspepsia    GERD (gastroesophageal reflux disease)    GI bleed    GI  BLEED FROM DUODENAL ULCER--ABOUT 17 YRS AGO - GIVEN TRANSFUSIONS   History of blood transfusion    1990's   History of frequent urinary tract infections    History of PSVT (paroxysmal supraventricular tachycardia)    can control by doing a valsalva. Triggered by elevated heart rate   Hypertension    Imbalance    Migraine headache    much reduced frequency   Tachycardia    Episodic   Tingling    left foot    TMJ (temporomandibular joint disorder)    PAST HX GRINDING TEETH --BUT NO LONGER A PROBLEM   Wears glasses     Past Surgical History:  Procedure Laterality Date   ABDOMINAL HYSTERECTOMY  1985   TAH/BSO--for ?fibroids   APPENDECTOMY     ARTHROPLASTY     left Knee    cyst removed      from mouth   KNEE ARTHROSCOPY Left 10/2005   LAPAROSCOPIC APPENDECTOMY  02/2000   REPLACEMENT TOTAL KNEE Right    TONSILLECTOMY     TOTAL HIP ARTHROPLASTY Right 03/06/2018   Procedure: TOTAL HIP ARTHROPLASTY ANTERIOR APPROACH;  Surgeon: Ollen Gross, MD;  Location: [facility];  Service: Orthopedics;  Laterality: Right;    TOTAL KNEE ARTHROPLASTY Left 07/29/2012   Procedure: LEFT TOTAL KNEE ARTHROPLASTY;  Surgeon: Loanne Drilling, MD;  Location: [facility];  Service: Orthopedics;  Laterality: Left;   TOTAL KNEE ARTHROPLASTY Right 05/28/2015   Procedure: RIGHT TOTAL KNEE ARTHROPLASTY;  Surgeon: Ollen Gross, MD;  Location: [facility];  Service: Orthopedics;  Laterality: Right;   WISDOM TOOTH EXTRACTION  2008    Prior to Admission medications   Medication Sig Start Date End Date Taking? Authorizing Provider  amLODipine (NORVASC) 5 MG tablet TAKE 1 TABLET(5 MG) BY MOUTH DAILY 03/14/22   Hoyt Koch, MD  benzonatate (TESSALON) 100 MG capsule Take 1 capsule (100 mg total) by mouth every 8 (eight) hours. Patient not taking: Reported on 01/27/2022 01/15/22   Deno Etienne, DO  diphenhydrAMINE (BENADRYL) 25 MG tablet Take 1 tablet (25 mg total) by mouth every 6 (six) hours as needed for up to 3  days. 10/06/21 0/98/11  Campbell Stall P, DO  EPINEPHrine 0.3 mg/0.3 mL IJ SOAJ injection Inject 0.3 mg into the muscle as needed for anaphylaxis. Patient not taking: Reported on 01/27/2022 12/01/76   Campbell Stall P, DO  hydrOXYzine (ATARAX) 25 MG tablet Take 1 tablet (25 mg total) by mouth 2 (two) times daily as needed. Patient not taking: Reported on 01/27/2022 03/07/21   Hoyt Koch, MD  montelukast (SINGULAIR) 10 MG tablet Take 1 tablet (10 mg total) by mouth at bedtime. Patient not taking: Reported on 01/27/2022 08/02/21   Biagio Borg, MD  ondansetron (ZOFRAN-ODT) 4 MG disintegrating tablet 4mg  ODT q4 hours prn nausea/vomit Patient not taking: Reported on 01/27/2022 01/15/22   Deno Etienne, DO  PREMARIN vaginal cream Place 1 Applicatorful vaginally once a week.  11/18/17   [provider]  triamcinolone cream (KENALOG) 0.1 % Apply 1 application topically 2 (two) times daily. Patient not taking: Reported on 01/27/2022 05/10/21 05/10/22  Biagio Borg, MD  esomeprazole (NEXIUM) 20 MG capsule Take 1 capsule (20 mg total) by mouth every morning. 05/20/15 08/16/20  Hoyt Koch, MD    Allergies  Allergen Reactions   Nitrofuran Derivatives Other (See Comments)    Chest pain and elevated liver enzymes   Other     PT STATES THE SULFA AND PENICILLIN SHE TOOK MANY YEARS AGO BOTH HAD RED DYES--SO SHE WONDERED IF ALLERGIC TO RED DYES   Penicillins Hives    Has had penicillin since and has been fine.     Sulfonamide Derivatives Hives    Social History   Socioeconomic History   Marital status: Married    Spouse name: Not on file   Number of children: 2   Years of education: 12   Highest education level: Not on file  Occupational History    Comment: retired  Tobacco Use   Smoking status: Never   Smokeless tobacco: Never  Vaping Use   Vaping Use: Never used  Substance and Sexual Activity   Alcohol use: No    Alcohol/week: 0.0 standard drinks of alcohol    Comment: no     Drug use: No   Sexual activity: Not Currently    Partners: Male    Birth control/protection: Surgical    Comment: TAH/BSO  Other Topics Concern   Not on file  Social History Narrative   [employer], married 1958 2 sons 1961 and 1963, 1 grandson 49. End of life issues - materials provided for contemplation. Still in the contemplative.   One grand son; 15    Social Determinants of Health   Financial Resource Strain: Low Risk  (03/10/2022)   Overall Financial Resource Strain (CARDIA)    Difficulty of Paying Living Expenses: Not hard at all  Food Insecurity: No Food Insecurity (03/10/2022)   Hunger Vital Sign    Worried About Running Out of Food in the Last Year: Never true    Ran  Out of Food in the Last Year: Never true  Transportation Needs: No Transportation Needs (03/10/2022)   PRAPARE - Administrator, Civil Service (Medical): No    Lack of Transportation (Non-Medical): No  Physical Activity: Sufficiently Active (03/10/2022)   Exercise Vital Sign    Days of Exercise per Week: 5 days    Minutes of Exercise per Session: 30 min  Stress: No Stress Concern Present (03/10/2022)   [employer] of Occupational Health - Occupational Stress Questionnaire    Feeling of Stress : Not at all  Social Connections: Socially Integrated (03/10/2022)   Social Connection and Isolation Panel [NHANES]    Frequency of Communication with Friends and Family: More than three times a week    Frequency of Social Gatherings with Friends and Family: More than three times a week    Attends Religious Services: More than 4 times per year    Active Member of [employer] or Organizations: Yes    Attends Engineer, structural: More than 4 times per year    Marital Status: Married  Catering manager Violence: Not At Risk (03/10/2022)   Humiliation, Afraid, Rape, and Kick questionnaire    Fear of Current or Ex-Partner: No    Emotionally Abused: No    Physically Abused: No     Sexually Abused: No    Tobacco Use: Low Risk  (03/10/2022)   Patient History    Smoking Tobacco Use: Never    Smokeless Tobacco Use: Never    Passive Exposure: Not on file   Social History   Substance and Sexual Activity  Alcohol Use No   Alcohol/week: 0.0 standard drinks of alcohol   Comment: no     Family History  Problem Relation Age of Onset   Cancer Neg Hx        Breast/Colon   Diabetes Neg Hx    Heart disease Neg Hx        CAD/MI    ROS   Objective:  Physical Exam: Left Hip Exam:  The range of motion: Flexion to 100 degrees, Internal Rotation to 0 degrees, External Rotation to 30 degrees, and abduction to 30 degrees without discomfort. When I internally rotate the hip, it hurts down to her knee.  There is no tenderness over the greater trochanteric bursa.    The patient's sensation and motor function are intact in their lower extremities. Their distal pulses are 2+. The bilateral calves are soft and non-tender.  RESULTS   - AP pelvis, AP and lateral of the left hip dated 10/2021 demonstrate bone-on-bone arthritis in the left hip with subchondral cystic formation.   Assessment/Plan:  End stage arthritis, left hip  The patient history, physical examination, clinical judgement of the provider and imaging studies are consistent with end stage degenerative joint disease of the left hip and total hip arthroplasty is deemed medically necessary. The treatment options including medical management, injection therapy, arthroscopy and arthroplasty were discussed at length. The risks and benefits of total hip arthroplasty were presented and reviewed. The risks due to aseptic loosening, infection, stiffness, dislocation/subluxation, thromboembolic complications and other imponderables were discussed. The patient acknowledged the explanation, agreed to proceed with the plan and consent was signed. Patient is being admitted for inpatient treatment for surgery, pain control, PT,  OT, prophylactic antibiotics, VTE prophylaxis, progressive ambulation and ADLs and discharge planning.The patient is planning to be discharged home with husband and HEP.   Patient's anticipated LOS is less than 2 midnights, meeting these requirements: -  Younger than 1 - Lives within 1 hour of care - Has a competent adult at home to recover with post-op recover - NO history of  - Chronic pain requiring opiods  - Diabetes  - Coronary Artery Disease  - Heart failure  - Heart attack  - Stroke  - DVT/VTE  - Cardiac arrhythmia  - Respiratory Failure/COPD  - Renal failure  - Anemia  - Advanced Liver disease  Therapy Plans: HEP Disposition: Home with husband Planned DVT Prophylaxis: Xarelto 10mg  QD (hx GI ulcer) x 3 weeks DME Needed: None PCP: , MD - clearance in OV note (02/01/22) TXA: IV Allergies: nitrofurantoin ("hurt liver"), PCN (hives), Sulfa (hives), meloxicam Anesthesia Concerns: None BMI: 25.7 Last HgbA1c: Not diabetic Pharmacy: [facility]  Other: -Concerned that previous spinal was very painful   - Patient was instructed on what medications to stop prior to surgery. - Follow-up visit in 2 weeks with Dr. 02/03/22 - Begin physical therapy following surgery - Pre-operative lab work as pre-surgical testing - Prescriptions will be provided in hospital at time of discharge  Lequita Halt, PA-C Orthopedic Surgery [facility] [place]

## 2022-04-06 ENCOUNTER — Ambulatory Visit: Payer: Medicare Other | Admitting: Allergy & Immunology

## 2022-04-06 DIAGNOSIS — H40023 Open angle with borderline findings, high risk, bilateral: Secondary | ICD-10-CM | POA: Diagnosis not present

## 2022-04-07 ENCOUNTER — Encounter (HOSPITAL_COMMUNITY)
Admission: RE | Admit: 2022-04-07 | Discharge: 2022-04-07 | Disposition: A | Discharge status: Home / Self Care | Payer: Medicare Other | Source: Ambulatory Visit | Attending: Orthopedic Surgery | Admitting: Orthopedic Surgery

## 2022-04-07 ENCOUNTER — Encounter (HOSPITAL_COMMUNITY): Payer: Self-pay

## 2022-04-07 VITALS — BP 138/68 | HR 65 | Temp 97.6°F | Resp 14 | Ht 67.0 in | Wt 161.0 lb

## 2022-04-07 DIAGNOSIS — I10 Essential (primary) hypertension: Secondary | ICD-10-CM

## 2022-04-07 DIAGNOSIS — Z01812 Encounter for preprocedural laboratory examination: Secondary | ICD-10-CM | POA: Diagnosis not present

## 2022-04-07 DIAGNOSIS — Z01818 Encounter for other preprocedural examination: Secondary | ICD-10-CM

## 2022-04-07 HISTORY — DX: Anxiety disorder, unspecified: F41.9

## 2022-04-07 LAB — BASIC METABOLIC PANEL
Anion gap: 9 (ref 5–15)
BUN: 12 mg/dL (ref 8–23)
CO2: 25 mmol/L (ref 22–32)
Calcium: 8.8 mg/dL — ABNORMAL LOW (ref 8.9–10.3)
Chloride: 103 mmol/L (ref 98–111)
Creatinine, Ser: 0.98 mg/dL (ref 0.44–1.00)
GFR, Estimated: 57 mL/min — ABNORMAL LOW (ref >60)
Glucose, Bld: 113 mg/dL — ABNORMAL HIGH (ref 70–99)
Potassium: 4.3 mmol/L (ref 3.5–5.1)
Sodium: 137 mmol/L (ref 135–145)

## 2022-04-07 LAB — CBC
HCT: 41.7 % (ref 36.0–46.0)
Hemoglobin: 13.2 g/dL (ref 12.0–15.0)
MCH: 28.5 pg (ref 26.0–34.0)
MCHC: 31.7 g/dL (ref 30.0–36.0)
MCV: 90.1 fL (ref 80.0–100.0)
Platelets: 314 10*3/uL (ref 150–400)
RBC: 4.63 MIL/uL (ref 3.87–5.11)
RDW: 13.1 % (ref 11.5–15.5)
WBC: 7.6 10*3/uL (ref 4.0–10.5)
nRBC: 0 % (ref 0.0–0.2)

## 2022-04-07 LAB — TYPE AND SCREEN
ABO/RH(D): O POS
Antibody Screen: NEGATIVE

## 2022-04-07 LAB — SURGICAL PCR SCREEN
MRSA, PCR: NEGATIVE
Staphylococcus aureus: NEGATIVE

## 2022-04-07 NOTE — Progress Notes (Addendum)
COVID Vaccine Completed:yes  Date of COVID positive in last 90 days: no  PCP - Pricilla Holm,  MD Cardiologist - Lauree Chandler, MD  Medical clearance by Pricilla Holm 01/27/22 in [place]   Chest x-ray - 01/15/22 Epic EKG - 11/11/21 Epic Stress Test - n/a ECHO - 11/25/21 Epic Cardiac Cath - n/a Pacemaker/ICD device last checked: n/a Spinal Cord Stimulator: n/a  Bowel Prep - no  Sleep Study - n/a CPAP -   Fasting Blood Sugar - n/a Checks Blood Sugar _____ times a day  Last dose of GLP1 agonist-  N/A GLP1 instructions:  N/A   Last dose of SGLT-2 inhibitors-  N/A SGLT-2 instructions: N/A   Blood Thinner Instructions: n/a Aspirin Instructions: Last Dose:  Activity level: Can go up a flight of stairs and perform activities of daily living without stopping and without symptoms of chest pain or shortness of breath.     Anesthesia review: cardiac clearance needed, HTN, PSVT  Patient denies shortness of breath, fever, cough and chest pain at PAT appointment  Patient verbalized understanding of instructions that were given to them at the PAT appointment. Patient was also instructed that they will need to review over the PAT instructions again at home before surgery.

## 2022-04-07 NOTE — Patient Instructions (Signed)
SURGICAL WAITING ROOM VISITATION  Patients having surgery or a procedure may have no more than 2 support people in the waiting area - these visitors may rotate.    Children under the age of 46 must have an adult with them who is not the patient.  Due to an increase in RSV and influenza rates and associated hospitalizations, children ages 73 and under may not visit patients in [facility] hospitals.  If the patient needs to stay at the hospital during part of their recovery, the visitor guidelines for inpatient rooms apply. Pre-op nurse will coordinate an appropriate time for 1 support person to accompany patient in pre-op.  This support person may not rotate.    Please refer to the [facility] website for the visitor guidelines for Inpatients (after your surgery is over and you are in a regular room).    Your procedure is scheduled on: 04/19/22   Report to [facility] Main Entrance    Report to admitting at 7:20 AM   Call this number if you have problems the morning of surgery [phone]   Do not eat food :After Midnight.   After Midnight you may have the following liquids until 6:50 AM DAY OF SURGERY  Water Non-Citrus Juices (without pulp, NO RED-Apple, White grape, White cranberry) Black Coffee (NO MILK/CREAM OR CREAMERS, sugar ok)  Clear Tea (NO MILK/CREAM OR CREAMERS, sugar ok) regular and decaf                             Plain Jell-O (NO RED)                                           Fruit ices (not with fruit pulp, NO RED)                                     Popsicles (NO RED)                                                               Sports drinks like Gatorade (NO RED)                 The day of surgery:  Drink ONE (1) Pre-Surgery Clear Ensure at 6:50 AM the morning of surgery. Drink in one sitting. Do not sip.  This drink was given to you during your hospital  pre-op appointment visit. Nothing else to drink after completing the  Pre-Surgery Clear  Ensure.          If you have questions, please contact your surgeon's office.   FOLLOW BOWEL PREP AND ANY ADDITIONAL PRE OP INSTRUCTIONS YOU RECEIVED FROM YOUR SURGEON'S OFFICE!!!     Oral Hygiene is also important to reduce your risk of infection.                                    Remember - BRUSH YOUR TEETH THE MORNING OF SURGERY WITH YOUR REGULAR TOOTHPASTE  DENTURES WILL  BE REMOVED PRIOR TO SURGERY PLEASE DO NOT APPLY "Poly grip" OR ADHESIVES!!!   Do NOT smoke after Midnight   Take these medicines the morning of surgery with A SIP OF WATER: Amlodipine, Nexium   DO NOT TAKE ANY ORAL DIABETIC MEDICATIONS DAY OF YOUR SURGERY  Bring CPAP mask and tubing day of surgery.                              You may not have any metal on your body including hair pins, jewelry, and body piercing             Do not wear make-up, lotions, powders, perfumes, or deodorant  Do not wear nail polish including gel and S&S, artificial/acrylic nails, or any other type of covering on natural nails including finger and toenails. If you have artificial nails, gel coating, etc. that needs to be removed by a nail salon please have this removed prior to surgery or surgery may need to be canceled/ delayed if the surgeon/ anesthesia feels like they are unable to be safely monitored.   Do not shave  48 hours prior to surgery.    Do not bring valuables to the hospital. [facility] IS NOT             RESPONSIBLE   FOR VALUABLES.   Contacts, glasses, dentures or bridgework may not be worn into surgery.   Bring small overnight bag day of surgery.   DO NOT BRING YOUR HOME MEDICATIONS TO THE HOSPITAL. PHARMACY WILL DISPENSE MEDICATIONS LISTED ON YOUR MEDICATION LIST TO YOU DURING YOUR ADMISSION IN THE HOSPITAL!   Special Instructions: Bring a copy of your healthcare power of attorney and living will documents the day of surgery if you haven't scanned them before.              Please read over the following fact  sheets you were given: IF YOU HAVE QUESTIONS ABOUT YOUR PRE-OP INSTRUCTIONS PLEASE CALL [phone] Contras   If you received a COVID test during your pre-op visit  it is requested that you wear a mask when out in public, stay away from anyone that may not be feeling well and notify your surgeon if you develop symptoms. If you test positive for Covid or have been in contact with anyone that has tested positive in the last 10 days please notify you surgeon.    [facility] - Preparing for Surgery Before surgery, you can play an important role.  Because skin is not sterile, your skin needs to be as free of germs as possible.  You can reduce the number of germs on your skin by washing with CHG (chlorahexidine gluconate) soap before surgery.  CHG is an antiseptic cleaner which kills germs and bonds with the skin to continue killing germs even after washing. Please DO NOT use if you have an allergy to CHG or antibacterial soaps.  If your skin becomes reddened/irritated stop using the CHG and inform your nurse when you arrive at Short Stay. Do not shave (including legs and underarms) for at least 48 hours prior to the first CHG shower.  You may shave your face/neck.  Please follow these instructions carefully:  1.  Shower with CHG Soap the night before surgery and the  morning of surgery.  2.  If you choose to wash your hair, wash your hair first as usual with your normal  shampoo.  3.  After you  shampoo, rinse your hair and body thoroughly to remove the shampoo.                             4.  Use CHG as you would any other liquid soap.  You can apply chg directly to the skin and wash.  Gently with a scrungie or clean washcloth.  5.  Apply the CHG Soap to your body ONLY FROM THE NECK DOWN.   Do   not use on face/ open                           Wound or open sores. Avoid contact with eyes, ears mouth and   genitals (private parts).                       Wash face,  Genitals (private parts) with your  normal soap.             6.  Wash thoroughly, paying special attention to the area where your    surgery  will be performed.  7.  Thoroughly rinse your body with warm water from the neck down.  8.  DO NOT shower/wash with your normal soap after using and rinsing off the CHG Soap.                9.  Pat yourself dry with a clean towel.            10.  Wear clean pajamas.            11.  Place clean sheets on your bed the night of your first shower and do not  sleep with pets. Day of Surgery : Do not apply any lotions/deodorants the morning of surgery.  Please wear clean clothes to the hospital/surgery center.  FAILURE TO FOLLOW THESE INSTRUCTIONS MAY RESULT IN THE CANCELLATION OF YOUR SURGERY  PATIENT SIGNATURE_________________________________  NURSE SIGNATURE__________________________________  ________________________________________________________________________  Rogelia Mire  An incentive spirometer is a tool that can help keep your lungs clear and active. This tool measures how well you are filling your lungs with each breath. Taking long deep breaths may help reverse or decrease the chance of developing breathing (pulmonary) problems (especially infection) following: A long period of time when you are unable to move or be active. BEFORE THE PROCEDURE  If the spirometer includes an indicator to show your best effort, your nurse or respiratory therapist will set it to a desired goal. If possible, sit up straight or lean slightly forward. Try not to slouch. Hold the incentive spirometer in an upright position. INSTRUCTIONS FOR USE  Sit on the edge of your bed if possible, or sit up as far as you can in bed or on a chair. Hold the incentive spirometer in an upright position. Breathe out normally. Place the mouthpiece in your mouth and seal your lips tightly around it. Breathe in slowly and as deeply as possible, raising the piston or the ball toward the top of the  column. Hold your breath for 3-5 seconds or for as long as possible. Allow the piston or ball to fall to the bottom of the column. Remove the mouthpiece from your mouth and breathe out normally. Rest for a few seconds and repeat Steps 1 through 7 at least 10 times every 1-2 hours when you are awake. Take your time and take a few normal breaths  between deep breaths. The spirometer may include an indicator to show your best effort. Use the indicator as a goal to work toward during each repetition. After each set of 10 deep breaths, practice coughing to be sure your lungs are clear. If you have an incision (the cut made at the time of surgery), support your incision when coughing by placing a pillow or rolled up towels firmly against it. Once you are able to get out of bed, walk around indoors and cough well. You may stop using the incentive spirometer when instructed by your caregiver.  RISKS AND COMPLICATIONS Take your time so you do not get dizzy or light-headed. If you are in pain, you may need to take or ask for pain medication before doing incentive spirometry. It is harder to take a deep breath if you are having pain. AFTER USE Rest and breathe slowly and easily. It can be helpful to keep track of a log of your progress. Your caregiver can provide you with a simple table to help with this. If you are using the spirometer at home, follow these instructions: [place] IF:  You are having difficultly using the spirometer. You have trouble using the spirometer as often as instructed. Your pain medication is not giving enough relief while using the spirometer. You develop fever of 100.5 F (38.1 C) or higher. SEEK IMMEDIATE MEDICAL CARE IF:  You cough up bloody sputum that had not been present before. You develop fever of 102 F (38.9 C) or greater. You develop worsening pain at or near the incision site. MAKE SURE YOU:  Understand these instructions. Will watch your  condition. Will get help right away if you are not doing well or get worse. Document Released: 07/17/2006 Document Revised: 05/29/2011 Document Reviewed: 09/17/2006 [employer] Patient Information 2014 [employer], [place].   ________________________________________________________________________ WHAT IS A BLOOD TRANSFUSION? Blood Transfusion Information  A transfusion is the replacement of blood or some of its parts. Blood is made up of multiple cells which provide different functions. Red blood cells carry oxygen and are used for blood loss replacement. White blood cells fight against infection. Platelets control bleeding. Plasma helps clot blood. Other blood products are available for specialized needs, such as hemophilia or other clotting disorders. BEFORE THE TRANSFUSION  Who gives blood for transfusions?  Healthy volunteers who are fully evaluated to make sure their blood is safe. This is blood bank blood. Transfusion therapy is the safest it has ever been in the practice of medicine. Before blood is taken from a donor, a complete history is taken to make sure that person has no history of diseases nor engages in risky social behavior (examples are intravenous drug use or sexual activity with multiple partners). The donor's travel history is screened to minimize risk of transmitting infections, such as malaria. The donated blood is tested for signs of infectious diseases, such as HIV and hepatitis. The blood is then tested to be sure it is compatible with you in order to minimize the chance of a transfusion reaction. If you or a relative donates blood, this is often done in anticipation of surgery and is not appropriate for emergency situations. It takes many days to process the donated blood. RISKS AND COMPLICATIONS Although transfusion therapy is very safe and saves many lives, the main dangers of transfusion include:  Getting an infectious disease. Developing a transfusion reaction. This is  an allergic reaction to something in the blood you were given. Every precaution is taken to prevent  this. The decision to have a blood transfusion has been considered carefully by your caregiver before blood is given. Blood is not given unless the benefits outweigh the risks. AFTER THE TRANSFUSION Right after receiving a blood transfusion, you will usually feel much better and more energetic. This is especially true if your red blood cells have gotten low (anemic). The transfusion raises the level of the red blood cells which carry oxygen, and this usually causes an energy increase. The nurse administering the transfusion will monitor you carefully for complications. HOME CARE INSTRUCTIONS  No special instructions are needed after a transfusion. You may find your energy is better. Speak with your caregiver about any limitations on activity for underlying diseases you may have. SEEK MEDICAL CARE IF:  Your condition is not improving after your transfusion. You develop redness or irritation at the intravenous (IV) site. SEEK IMMEDIATE MEDICAL CARE IF:  Any of the following symptoms occur over the next 12 hours: Shaking chills. You have a temperature by mouth above 102 F (38.9 C), not controlled by medicine. Chest, back, or muscle pain. People around you feel you are not acting correctly or are confused. Shortness of breath or difficulty breathing. Dizziness and fainting. You get a rash or develop hives. You have a decrease in urine output. Your urine turns a dark color or changes to pink, red, or brown. Any of the following symptoms occur over the next 10 days: You have a temperature by mouth above 102 F (38.9 C), not controlled by medicine. Shortness of breath. Weakness after normal activity. The white part of the eye turns yellow (jaundice). You have a decrease in the amount of urine or are urinating less often. Your urine turns a dark color or changes to pink, red, or brown. Document  Released: 03/03/2000 Document Revised: 05/29/2011 Document Reviewed: 10/21/2007 [facility] Patient Information 2014 [place], [place].  _______________________________________________________________________

## 2022-04-19 ENCOUNTER — Observation Stay (HOSPITAL_COMMUNITY): Payer: Medicare Other

## 2022-04-19 ENCOUNTER — Encounter (HOSPITAL_COMMUNITY)
Admission: RE | Disposition: A | Discharge status: Home / Self Care | Payer: Self-pay | Source: Ambulatory Visit | Attending: Orthopedic Surgery

## 2022-04-19 ENCOUNTER — Other Ambulatory Visit: Payer: Self-pay

## 2022-04-19 ENCOUNTER — Ambulatory Visit (HOSPITAL_COMMUNITY): Payer: Medicare Other

## 2022-04-19 ENCOUNTER — Observation Stay (HOSPITAL_COMMUNITY)
Admission: RE | Admit: 2022-04-19 | Discharge: 2022-04-20 | Disposition: A | Discharge status: Home / Self Care | Payer: Medicare Other | Source: Ambulatory Visit | Attending: Orthopedic Surgery | Admitting: Orthopedic Surgery

## 2022-04-19 ENCOUNTER — Ambulatory Visit (HOSPITAL_BASED_OUTPATIENT_CLINIC_OR_DEPARTMENT_OTHER): Payer: Medicare Other | Admitting: Anesthesiology

## 2022-04-19 ENCOUNTER — Encounter (HOSPITAL_COMMUNITY): Payer: Self-pay | Admitting: Orthopedic Surgery

## 2022-04-19 ENCOUNTER — Ambulatory Visit (HOSPITAL_COMMUNITY): Payer: Medicare Other | Admitting: Physician Assistant

## 2022-04-19 DIAGNOSIS — Z96653 Presence of artificial knee joint, bilateral: Secondary | ICD-10-CM | POA: Diagnosis not present

## 2022-04-19 DIAGNOSIS — M1612 Unilateral primary osteoarthritis, left hip: Secondary | ICD-10-CM

## 2022-04-19 DIAGNOSIS — Z471 Aftercare following joint replacement surgery: Secondary | ICD-10-CM | POA: Diagnosis not present

## 2022-04-19 DIAGNOSIS — Z96641 Presence of right artificial hip joint: Secondary | ICD-10-CM | POA: Diagnosis not present

## 2022-04-19 DIAGNOSIS — I129 Hypertensive chronic kidney disease with stage 1 through stage 4 chronic kidney disease, or unspecified chronic kidney disease: Secondary | ICD-10-CM | POA: Diagnosis not present

## 2022-04-19 DIAGNOSIS — N183 Chronic kidney disease, stage 3 unspecified: Secondary | ICD-10-CM | POA: Diagnosis not present

## 2022-04-19 DIAGNOSIS — Z79899 Other long term (current) drug therapy: Secondary | ICD-10-CM | POA: Diagnosis not present

## 2022-04-19 DIAGNOSIS — R600 Localized edema: Secondary | ICD-10-CM | POA: Diagnosis not present

## 2022-04-19 DIAGNOSIS — Z96642 Presence of left artificial hip joint: Secondary | ICD-10-CM | POA: Diagnosis not present

## 2022-04-19 DIAGNOSIS — M169 Osteoarthritis of hip, unspecified: Secondary | ICD-10-CM | POA: Diagnosis present

## 2022-04-19 HISTORY — PX: TOTAL HIP ARTHROPLASTY: SHX124

## 2022-04-19 SURGERY — ARTHROPLASTY, HIP, TOTAL, ANTERIOR APPROACH
Anesthesia: Monitor Anesthesia Care | Site: Hip | Laterality: Left

## 2022-04-19 MED ORDER — ONDANSETRON HCL 4 MG/2ML IJ SOLN: 4.0000 mg | Freq: Four times a day (QID) | INTRAMUSCULAR | Status: DC | PRN

## 2022-04-19 MED ORDER — FENTANYL CITRATE PF 50 MCG/ML IJ SOSY
25.0000 ug | PREFILLED_SYRINGE | INTRAMUSCULAR | Status: DC | PRN
  Administered 2022-04-19: 50 ug via INTRAVENOUS

## 2022-04-19 MED ORDER — FENTANYL CITRATE (PF) 100 MCG/2ML IJ SOLN
INTRAMUSCULAR | Status: DC | PRN
  Administered 2022-04-19: 50 ug via INTRAVENOUS

## 2022-04-19 MED ORDER — POVIDONE-IODINE 10 % EX SWAB
2.0000 | Freq: Once | CUTANEOUS | Status: AC
  Administered 2022-04-19: 2 via TOPICAL

## 2022-04-19 MED ORDER — AMLODIPINE BESYLATE 5 MG PO TABS
5.0000 mg | ORAL_TABLET | Freq: Every day | ORAL | Status: DC
  Administered 2022-04-20: 5 mg via ORAL
  Filled 2022-04-19: qty 1

## 2022-04-19 MED ORDER — ORAL CARE MOUTH RINSE: 15.0000 mL | Freq: Once | OROMUCOSAL | Status: AC

## 2022-04-19 MED ORDER — METOCLOPRAMIDE HCL 5 MG/ML IJ SOLN: 5.0000 mg | Freq: Three times a day (TID) | INTRAMUSCULAR | Status: DC | PRN

## 2022-04-19 MED ORDER — PHENYLEPHRINE HCL-NACL 20-0.9 MG/250ML-% IV SOLN
INTRAVENOUS | Status: AC
  Filled 2022-04-19: qty 250

## 2022-04-19 MED ORDER — ONDANSETRON HCL 4 MG/2ML IJ SOLN
INTRAMUSCULAR | Status: DC | PRN
  Administered 2022-04-19: 4 mg via INTRAVENOUS

## 2022-04-19 MED ORDER — LIDOCAINE HCL (PF) 2 % IJ SOLN
INTRAMUSCULAR | Status: AC
  Filled 2022-04-19: qty 5

## 2022-04-19 MED ORDER — METOCLOPRAMIDE HCL 5 MG PO TABS: 5.0000 mg | ORAL_TABLET | Freq: Three times a day (TID) | ORAL | Status: DC | PRN

## 2022-04-19 MED ORDER — TRAMADOL HCL 50 MG PO TABS
ORAL_TABLET | ORAL | Status: AC
  Filled 2022-04-19: qty 1

## 2022-04-19 MED ORDER — BUPIVACAINE HCL 0.25 % IJ SOLN
INTRAMUSCULAR | Status: AC
  Filled 2022-04-19: qty 1

## 2022-04-19 MED ORDER — MENTHOL 3 MG MT LOZG: 1.0000 | LOZENGE | OROMUCOSAL | Status: DC | PRN

## 2022-04-19 MED ORDER — 0.9 % SODIUM CHLORIDE (POUR BTL) OPTIME
TOPICAL | Status: DC | PRN
  Administered 2022-04-19: 1000 mL

## 2022-04-19 MED ORDER — PROPOFOL 1000 MG/100ML IV EMUL
INTRAVENOUS | Status: AC
  Filled 2022-04-19: qty 100

## 2022-04-19 MED ORDER — FENTANYL CITRATE PF 50 MCG/ML IJ SOSY
PREFILLED_SYRINGE | INTRAMUSCULAR | Status: AC
  Filled 2022-04-19: qty 1

## 2022-04-19 MED ORDER — METHOCARBAMOL 500 MG IVPB - SIMPLE MED
INTRAVENOUS | Status: AC
  Filled 2022-04-19: qty 55

## 2022-04-19 MED ORDER — DEXAMETHASONE SODIUM PHOSPHATE 10 MG/ML IJ SOLN
8.0000 mg | Freq: Once | INTRAMUSCULAR | Status: AC
  Administered 2022-04-19: 8 mg via INTRAVENOUS

## 2022-04-19 MED ORDER — METHOCARBAMOL 500 MG IVPB - SIMPLE MED
500.0000 mg | Freq: Four times a day (QID) | INTRAVENOUS | Status: DC | PRN
  Administered 2022-04-19: 500 mg via INTRAVENOUS

## 2022-04-19 MED ORDER — DEXAMETHASONE SODIUM PHOSPHATE 10 MG/ML IJ SOLN
INTRAMUSCULAR | Status: AC
  Filled 2022-04-19: qty 1

## 2022-04-19 MED ORDER — POLYETHYLENE GLYCOL 3350 17 G PO PACK: 17.0000 g | PACK | Freq: Every day | ORAL | Status: DC | PRN

## 2022-04-19 MED ORDER — ONDANSETRON HCL 4 MG PO TABS: 4.0000 mg | ORAL_TABLET | Freq: Four times a day (QID) | ORAL | Status: DC | PRN

## 2022-04-19 MED ORDER — DEXMEDETOMIDINE HCL IN NACL 80 MCG/20ML IV SOLN
INTRAVENOUS | Status: DC | PRN
  Administered 2022-04-19: 4 ug via BUCCAL

## 2022-04-19 MED ORDER — TRANEXAMIC ACID-NACL 1000-0.7 MG/100ML-% IV SOLN
1000.0000 mg | INTRAVENOUS | Status: AC
Start: 2022-04-19 — End: 2022-04-19
  Administered 2022-04-19: 1000 mg via INTRAVENOUS
  Filled 2022-04-19: qty 100

## 2022-04-19 MED ORDER — BUPIVACAINE IN DEXTROSE 0.75-8.25 % IT SOLN
INTRATHECAL | Status: DC | PRN
  Administered 2022-04-19: 1.6 mL via INTRATHECAL

## 2022-04-19 MED ORDER — BISACODYL 10 MG RE SUPP: 10.0000 mg | Freq: Every day | RECTAL | Status: DC | PRN

## 2022-04-19 MED ORDER — HYDROCODONE-ACETAMINOPHEN 5-325 MG PO TABS
1.0000 | ORAL_TABLET | ORAL | Status: DC | PRN
  Administered 2022-04-19 – 2022-04-20 (×6): 2 via ORAL
  Filled 2022-04-19 (×6): qty 2

## 2022-04-19 MED ORDER — PROPOFOL 500 MG/50ML IV EMUL
INTRAVENOUS | Status: DC | PRN
  Administered 2022-04-19: 25 ug/kg/min via INTRAVENOUS

## 2022-04-19 MED ORDER — MAGNESIUM CITRATE PO SOLN: 1.0000 | Freq: Once | ORAL | Status: DC | PRN

## 2022-04-19 MED ORDER — ACETAMINOPHEN 325 MG PO TABS: 325.0000 mg | ORAL_TABLET | Freq: Four times a day (QID) | ORAL | Status: DC | PRN

## 2022-04-19 MED ORDER — EPINEPHRINE 0.3 MG/0.3ML IJ SOAJ: 0.3000 mg | INTRAMUSCULAR | Status: DC | PRN

## 2022-04-19 MED ORDER — PANTOPRAZOLE SODIUM 40 MG PO TBEC
40.0000 mg | DELAYED_RELEASE_TABLET | Freq: Every day | ORAL | Status: DC
  Administered 2022-04-20: 40 mg via ORAL
  Filled 2022-04-19: qty 1

## 2022-04-19 MED ORDER — FENTANYL CITRATE (PF) 100 MCG/2ML IJ SOLN
INTRAMUSCULAR | Status: AC
  Filled 2022-04-19: qty 2

## 2022-04-19 MED ORDER — CEFAZOLIN SODIUM-DEXTROSE 2-4 GM/100ML-% IV SOLN
2.0000 g | INTRAVENOUS | Status: AC
Start: 2022-04-19 — End: 2022-04-19
  Administered 2022-04-19: 2 g via INTRAVENOUS
  Filled 2022-04-19: qty 100

## 2022-04-19 MED ORDER — LACTATED RINGERS IV SOLN: INTRAVENOUS | Status: DC

## 2022-04-19 MED ORDER — ONDANSETRON HCL 4 MG/2ML IJ SOLN
INTRAMUSCULAR | Status: AC
  Filled 2022-04-19: qty 2

## 2022-04-19 MED ORDER — MIDAZOLAM HCL 5 MG/5ML IJ SOLN
INTRAMUSCULAR | Status: DC | PRN
  Administered 2022-04-19: 1 mg via INTRAVENOUS
  Administered 2022-04-19 (×2): .5 mg via INTRAVENOUS

## 2022-04-19 MED ORDER — METHOCARBAMOL 500 MG PO TABS
500.0000 mg | ORAL_TABLET | Freq: Four times a day (QID) | ORAL | Status: DC | PRN
  Administered 2022-04-19 – 2022-04-20 (×2): 500 mg via ORAL
  Filled 2022-04-19 (×2): qty 1

## 2022-04-19 MED ORDER — ACETAMINOPHEN 10 MG/ML IV SOLN
1000.0000 mg | Freq: Four times a day (QID) | INTRAVENOUS | Status: DC
  Administered 2022-04-19: 1000 mg via INTRAVENOUS
  Filled 2022-04-19: qty 100

## 2022-04-19 MED ORDER — SODIUM CHLORIDE 0.9 % IV SOLN: INTRAVENOUS | Status: DC

## 2022-04-19 MED ORDER — CHLORHEXIDINE GLUCONATE 0.12 % MT SOLN
15.0000 mL | Freq: Once | OROMUCOSAL | Status: AC
  Administered 2022-04-19: 15 mL via OROMUCOSAL

## 2022-04-19 MED ORDER — PHENYLEPHRINE HCL-NACL 20-0.9 MG/250ML-% IV SOLN
INTRAVENOUS | Status: DC | PRN
  Administered 2022-04-19: 30 ug/min via INTRAVENOUS

## 2022-04-19 MED ORDER — WATER FOR IRRIGATION, STERILE IR SOLN
Status: DC | PRN
  Administered 2022-04-19: 2000 mL

## 2022-04-19 MED ORDER — RIVAROXABAN 10 MG PO TABS
10.0000 mg | ORAL_TABLET | Freq: Every day | ORAL | Status: DC
  Administered 2022-04-20: 10 mg via ORAL
  Filled 2022-04-19: qty 1

## 2022-04-19 MED ORDER — ACETAMINOPHEN 10 MG/ML IV SOLN: 1000.0000 mg | Freq: Once | INTRAVENOUS | Status: DC | PRN

## 2022-04-19 MED ORDER — LIDOCAINE HCL (CARDIAC) PF 100 MG/5ML IV SOSY
PREFILLED_SYRINGE | INTRAVENOUS | Status: DC | PRN
  Administered 2022-04-19: 40 mg via INTRAVENOUS

## 2022-04-19 MED ORDER — PHENOL 1.4 % MT LIQD: 1.0000 | OROMUCOSAL | Status: DC | PRN

## 2022-04-19 MED ORDER — CEFAZOLIN SODIUM-DEXTROSE 2-4 GM/100ML-% IV SOLN
2.0000 g | Freq: Four times a day (QID) | INTRAVENOUS | Status: AC
  Administered 2022-04-19 (×2): 2 g via INTRAVENOUS
  Filled 2022-04-19 (×2): qty 100

## 2022-04-19 MED ORDER — DEXAMETHASONE SODIUM PHOSPHATE 10 MG/ML IJ SOLN
10.0000 mg | Freq: Once | INTRAMUSCULAR | Status: AC
  Administered 2022-04-20: 10 mg via INTRAVENOUS
  Filled 2022-04-19: qty 1

## 2022-04-19 MED ORDER — MORPHINE SULFATE (PF) 2 MG/ML IV SOLN: 0.5000 mg | INTRAVENOUS | Status: DC | PRN

## 2022-04-19 MED ORDER — DOCUSATE SODIUM 100 MG PO CAPS
100.0000 mg | ORAL_CAPSULE | Freq: Two times a day (BID) | ORAL | Status: DC
  Administered 2022-04-20: 100 mg via ORAL
  Filled 2022-04-19: qty 1

## 2022-04-19 MED ORDER — BUPIVACAINE HCL (PF) 0.25 % IJ SOLN
INTRAMUSCULAR | Status: DC | PRN
  Administered 2022-04-19: 30 mL

## 2022-04-19 MED ORDER — MIDAZOLAM HCL 2 MG/2ML IJ SOLN
INTRAMUSCULAR | Status: AC
  Filled 2022-04-19: qty 2

## 2022-04-19 MED ORDER — TRAMADOL HCL 50 MG PO TABS
50.0000 mg | ORAL_TABLET | Freq: Four times a day (QID) | ORAL | Status: DC | PRN
  Administered 2022-04-19: 50 mg via ORAL

## 2022-04-19 SURGICAL SUPPLY — 42 items
BAG COUNTER SPONGE SURGICOUNT (BAG) IMPLANT
BAG DECANTER FOR FLEXI CONT (MISCELLANEOUS) IMPLANT
BAG SPEC THK2 15X12 ZIP CLS (MISCELLANEOUS)
BAG ZIPLOCK 12X15 (MISCELLANEOUS) IMPLANT
BLADE SAG 18X100X1.27 (BLADE) ×1 IMPLANT
COVER PERINEAL POST (MISCELLANEOUS) ×1 IMPLANT
COVER SURGICAL LIGHT HANDLE (MISCELLANEOUS) ×1 IMPLANT
CUP ACETBLR 48 OD SECTOR II (Hips) IMPLANT
DRAPE FOOT SWITCH (DRAPES) ×1 IMPLANT
DRAPE STERI IOBAN 125X83 (DRAPES) ×1 IMPLANT
DRAPE U-SHAPE 47X51 STRL (DRAPES) ×2 IMPLANT
DRSG AQUACEL AG ADV 3.5X10 (GAUZE/BANDAGES/DRESSINGS) ×1 IMPLANT
DURAPREP 26ML APPLICATOR (WOUND CARE) ×1 IMPLANT
ELECT REM PT RETURN 15FT ADLT (MISCELLANEOUS) ×1 IMPLANT
GLOVE BIO SURGEON STRL SZ 6.5 (GLOVE) IMPLANT
GLOVE BIO SURGEON STRL SZ7.5 (GLOVE) IMPLANT
GLOVE BIO SURGEON STRL SZ8 (GLOVE) ×1 IMPLANT
GLOVE BIOGEL PI IND STRL 6.5 (GLOVE) IMPLANT
GLOVE BIOGEL PI IND STRL 7.0 (GLOVE) IMPLANT
GLOVE BIOGEL PI IND STRL 8 (GLOVE) ×1 IMPLANT
GOWN STRL REUS W/ TWL LRG LVL3 (GOWN DISPOSABLE) ×1 IMPLANT
GOWN STRL REUS W/ TWL XL LVL3 (GOWN DISPOSABLE) IMPLANT
GOWN STRL REUS W/TWL LRG LVL3 (GOWN DISPOSABLE) ×1
GOWN STRL REUS W/TWL XL LVL3 (GOWN DISPOSABLE)
HEAD FEM STD 28X+1.5 STRL (Hips) IMPLANT
HOLDER FOLEY CATH W/STRAP (MISCELLANEOUS) ×1 IMPLANT
KIT TURNOVER KIT A (KITS) IMPLANT
LINER MARATHON 28 48 (Hips) IMPLANT
MANIFOLD NEPTUNE II (INSTRUMENTS) ×1 IMPLANT
PACK ANTERIOR HIP CUSTOM (KITS) ×1 IMPLANT
PENCIL SMOKE EVACUATOR COATED (MISCELLANEOUS) ×1 IMPLANT
SPIKE FLUID TRANSFER (MISCELLANEOUS) ×1 IMPLANT
STEM FEMORAL SZ6 HIGH ACTIS (Stem) IMPLANT
STRIP CLOSURE SKIN 1/2X4 (GAUZE/BANDAGES/DRESSINGS) ×1 IMPLANT
SUT ETHIBOND NAB CT1 #1 30IN (SUTURE) ×1 IMPLANT
SUT MNCRL AB 4-0 PS2 18 (SUTURE) ×1 IMPLANT
SUT STRATAFIX 0 PDS 27 VIOLET (SUTURE) ×1
SUT VIC AB 2-0 CT1 27 (SUTURE) ×2
SUT VIC AB 2-0 CT1 TAPERPNT 27 (SUTURE) ×2 IMPLANT
SUTURE STRATFX 0 PDS 27 VIOLET (SUTURE) ×1 IMPLANT
TRAY FOLEY MTR SLVR 16FR STAT (SET/KITS/TRAYS/PACK) ×1 IMPLANT
TUBE SUCTION HIGH CAP CLEAR NV (SUCTIONS) ×1 IMPLANT

## 2022-04-19 NOTE — Anesthesia Preprocedure Evaluation (Signed)
Anesthesia Evaluation  Patient identified by MRN, date of birth, ID band Patient awake    Reviewed: Allergy & Precautions, NPO status , Patient's Chart, lab work & pertinent test results  Airway Mallampati: II  TM Distance: >3 FB Neck ROM: Full    Dental no notable dental hx.    Pulmonary neg pulmonary ROS   Pulmonary exam normal        Cardiovascular hypertension, Pt. on medications  Rhythm:Regular Rate:Normal     Neuro/Psych  Headaches  Anxiety        GI/Hepatic Neg liver ROS,GERD  Medicated,,  Endo/Other  negative endocrine ROS    Renal/GU   negative genitourinary   Musculoskeletal  (+) Arthritis , Osteoarthritis,    Abdominal Normal abdominal exam  (+)   Peds  Hematology negative hematology ROS (+)   Anesthesia Other Findings   Reproductive/Obstetrics                             Anesthesia Physical Anesthesia Plan  ASA: 2  Anesthesia Plan: MAC and Spinal   Post-op Pain Management:    Induction: Intravenous  PONV Risk Score and Plan: 2 and Ondansetron, Dexamethasone, Propofol infusion and Treatment may vary due to age or medical condition  Airway Management Planned: Simple Face Mask, Natural Airway and Nasal Cannula  Additional Equipment: None  Intra-op Plan:   Post-operative Plan:   Informed Consent: I have reviewed the patients History and Physical, chart, labs and discussed the procedure including the risks, benefits and alternatives for the proposed anesthesia with the patient or authorized representative who has indicated his/her understanding and acceptance.     Dental advisory given  Plan Discussed with: CRNA  Anesthesia Plan Comments: (Lab Results      Component                Value               Date                      WBC                      7.6                 04/07/2022                HGB                      13.2                04/07/2022                 HCT                      41.7                04/07/2022                MCV                      90.1                04/07/2022                PLT  314                 04/07/2022             Lab Results      Component                Value               Date                      NA                       137                 04/07/2022                K                        4.3                 04/07/2022                CO2                      25                  04/07/2022                GLUCOSE                  113 (H)             04/07/2022                BUN                      12                  04/07/2022                CREATININE               0.98                04/07/2022                CALCIUM                  8.8 (L)             04/07/2022                GFRNONAA                 57 (L)              04/07/2022           )       Anesthesia Quick Evaluation

## 2022-04-19 NOTE — Plan of Care (Signed)
  Problem: Activity: Goal: Ability to avoid complications of mobility impairment will improve Outcome: Progressing   Problem: Pain Management: Goal: Pain level will decrease with appropriate interventions Outcome: Progressing   Problem: Nutrition: Goal: Adequate nutrition will be maintained Outcome: Progressing   

## 2022-04-19 NOTE — Discharge Instructions (Signed)
Frank Aluisio, MD Total Joint Specialist [facility] [address] [place], [place] [address] [phone]  ANTERIOR APPROACH TOTAL HIP REPLACEMENT POSTOPERATIVE DIRECTIONS     Hip Rehabilitation, Guidelines Following Surgery  The results of a hip operation are greatly improved after range of motion and muscle strengthening exercises. Follow all safety measures which are given to protect your hip. If any of these exercises cause increased pain or swelling in your joint, decrease the amount until you are comfortable again. Then slowly increase the exercises. Call your caregiver if you have problems or questions.   BLOOD CLOT PREVENTION Take a 10 mg Xarelto once a day for three weeks following surgery. Then take an 81 mg Aspirin once a day for three weeks. Then discontinue Aspirin. You may resume your vitamins/supplements once you have discontinued the Xarelto. Do not take any NSAIDs (Advil, Aleve, Ibuprofen, Meloxicam, etc.) until you have discontinued the Xarelto.   HOME CARE INSTRUCTIONS  Remove items at home which could result in a fall. This includes throw rugs or furniture in walking pathways.  ICE to the affected hip as frequently as 20-30 minutes an hour and then as needed for pain and swelling. Continue to use ice on the hip for pain and swelling from surgery. You may notice swelling that will progress down to the foot and ankle. This is normal after surgery. Elevate the leg when you are not up walking on it.   Continue to use the breathing machine which will help keep your temperature down.  It is common for your temperature to cycle up and down following surgery, especially at night when you are not up moving around and exerting yourself.  The breathing machine keeps your lungs expanded and your temperature down.  DIET You may resume your previous home diet once your are discharged from the hospital.  DRESSING / WOUND CARE / SHOWERING You have an  adhesive waterproof bandage over the incision. Leave this in place until your first follow-up appointment. Once you remove this you will not need to place another bandage.  You may begin showering 3 days following surgery, but do not submerge the incision under water.  ACTIVITY For the first 3-5 days, it is important to rest and keep the operative leg elevated. You should, as a general rule, rest for 50 minutes and walk/stretch for 10 minutes per hour. After 5 days, you may slowly increase activity as tolerated.  Perform the exercises you were provided twice a day for about 15-20 minutes each session. Begin these 2 days following surgery. Walk with your walker as instructed. Use the walker until you are comfortable transitioning to a cane. Walk with the cane in the opposite hand of the operative leg. You may discontinue the cane once you are comfortable and walking steadily. Avoid periods of inactivity such as sitting longer than an hour when not asleep. This helps prevent blood clots.  Do not drive a car for 6 weeks or until released by your surgeon.  Do not drive while taking narcotics.  TED HOSE STOCKINGS Wear the elastic stockings on both legs for three weeks following surgery during the day. You may remove them at night while sleeping.  WEIGHT BEARING Weight bearing as tolerated with assist device (walker, cane, etc) as directed, use it as long as suggested by your surgeon or therapist, typically at least 4-6 weeks.  POSTOPERATIVE CONSTIPATION PROTOCOL Constipation - defined medically as fewer than three stools per week and severe constipation as less   than one stool per week.  One of the most common issues patients have following surgery is constipation.  Even if you have a regular bowel pattern at home, your normal regimen is likely to be disrupted due to multiple reasons following surgery.  Combination of anesthesia, postoperative narcotics, change in appetite and fluid intake all can  affect your bowels.  In order to avoid complications following surgery, here are some recommendations in order to help you during your recovery period.  Colace (docusate) - Pick up an over-the-counter form of Colace or another stool softener and take twice a day as long as you are requiring postoperative pain medications.  Take with a full glass of water daily.  If you experience loose stools or diarrhea, hold the colace until you stool forms back up.  If your symptoms do not get better within 1 week or if they get worse, check with your doctor. Dulcolax (bisacodyl) - Pick up over-the-counter and take as directed by the product packaging as needed to assist with the movement of your bowels.  Take with a full glass of water.  Use this product as needed if not relieved by Colace only.  MiraLax (polyethylene glycol) - Pick up over-the-counter to have on hand.  MiraLax is a solution that will increase the amount of water in your bowels to assist with bowel movements.  Take as directed and can mix with a glass of water, juice, soda, coffee, or tea.  Take if you go more than two days without a movement.Do not use MiraLax more than once per day. Call your doctor if you are still constipated or irregular after using this medication for 7 days in a row.  If you continue to have problems with postoperative constipation, please contact the office for further assistance and recommendations.  If you experience "the worst abdominal pain ever" or develop nausea or vomiting, please contact the office immediatly for further recommendations for treatment.  ITCHING  If you experience itching with your medications, try taking only a single pain pill, or even half a pain pill at a time.  You can also use Benadryl over the counter for itching or also to help with sleep.   MEDICATIONS See your medication summary on the "After Visit Summary" that the nursing staff will review with you prior to discharge.  You may have some home  medications which will be placed on hold until you complete the course of blood thinner medication.  It is important for you to complete the blood thinner medication as prescribed by your surgeon.  Continue your approved medications as instructed at time of discharge.  PRECAUTIONS If you experience chest pain or shortness of breath - call 911 immediately for transfer to the hospital emergency department.  If you develop a fever greater that 101 F, purulent drainage from wound, increased redness or drainage from wound, foul odor from the wound/dressing, or calf pain - CONTACT YOUR SURGEON.                                                   FOLLOW-UP APPOINTMENTS Make sure you keep all of your appointments after your operation with your surgeon and caregivers. You should call the office at the above phone number and make an appointment for approximately two weeks after the date of your surgery or on the date   instructed by your surgeon outlined in the "After Visit Summary".  RANGE OF MOTION AND STRENGTHENING EXERCISES  These exercises are designed to help you keep full movement of your hip joint. Follow your caregiver's or physical therapist's instructions. Perform all exercises about fifteen times, three times per day or as directed. Exercise both hips, even if you have had only one joint replacement. These exercises can be done on a training (exercise) mat, on the floor, on a table or on a bed. Use whatever works the best and is most comfortable for you. Use music or television while you are exercising so that the exercises are a pleasant break in your day. This will make your life better with the exercises acting as a break in routine you can look forward to.  Lying on your back, slowly slide your foot toward your buttocks, raising your knee up off the floor. Then slowly slide your foot back down until your leg is straight again.  Lying on your back spread your legs as far apart as you can without causing  discomfort.  Lying on your side, raise your upper leg and foot straight up from the floor as far as is comfortable. Slowly lower the leg and repeat.  Lying on your back, tighten up the muscle in the front of your thigh (quadriceps muscles). You can do this by keeping your leg straight and trying to raise your heel off the floor. This helps strengthen the largest muscle supporting your knee.  Lying on your back, tighten up the muscles of your buttocks both with the legs straight and with the knee bent at a comfortable angle while keeping your heel on the floor.   POST-OPERATIVE OPIOID TAPER INSTRUCTIONS: It is important to wean off of your opioid medication as soon as possible. If you do not need pain medication after your surgery it is ok to stop day one. Opioids include: Codeine, Hydrocodone(Norco, Vicodin), Oxycodone(Percocet, oxycontin) and hydromorphone amongst others.  Long term and even short term use of opiods can cause: Increased pain response Dependence Constipation Depression Respiratory depression And more.  Withdrawal symptoms can include Flu like symptoms Nausea, vomiting And more Techniques to manage these symptoms Hydrate well Eat regular healthy meals Stay active Use relaxation techniques(deep breathing, meditating, yoga) Do Not substitute Alcohol to help with tapering If you have been on opioids for less than two weeks and do not have pain than it is ok to stop all together.  Plan to wean off of opioids This plan should start within one week post op of your joint replacement. Maintain the same interval or time between taking each dose and first decrease the dose.  Cut the total daily intake of opioids by one tablet each day Next start to increase the time between doses. The last dose that should be eliminated is the evening dose.   IF YOU ARE TRANSFERRED TO A SKILLED REHAB FACILITY If the patient is transferred to a skilled rehab facility following release from the  hospital, a list of the current medications will be sent to the facility for the patient to continue.  When discharged from the skilled rehab facility, please have the facility set up the patient's Home Health Physical Therapy prior to being released. Also, the skilled facility will be responsible for providing the patient with their medications at time of release from the facility to include their pain medication, the muscle relaxants, and their blood thinner medication. If the patient is still at the rehab facility at   time of the two week follow up appointment, the skilled rehab facility will also need to assist the patient in arranging follow up appointment in our office and any transportation needs.  MAKE SURE YOU:  Understand these instructions.  Get help right away if you are not doing well or get worse.    DENTAL ANTIBIOTICS:  In most cases prophylactic antibiotics for Dental procdeures after total joint surgery are not necessary.  Exceptions are as follows:  1. History of prior total joint infection  2. Severely immunocompromised (Organ Transplant, cancer chemotherapy, Rheumatoid biologic meds such as Humera)  3. Poorly controlled diabetes (A1C &gt; 8.0, blood glucose over 200)  If you have one of these conditions, contact your surgeon for an antibiotic prescription, prior to your dental procedure.    Pick up stool softner and laxative for home use following surgery while on pain medications. Do not submerge incision under water. Please use good hand washing techniques while changing dressing each day. May shower starting three days after surgery. Please use a clean towel to pat the incision dry following showers. Continue to use ice for pain and swelling after surgery. Do not use any lotions or creams on the incision until instructed by your surgeon.    Information on my medicine - XARELTO (Rivaroxaban)    Why was Xarelto prescribed for you? Xarelto was prescribed  for you to reduce the risk of blood clots forming after orthopedic surgery. The medical term for these abnormal blood clots is venous thromboembolism (VTE).  What do you need to know about xarelto ? Take your Xarelto ONCE DAILY at the same time every day. You may take it either with or without food.  If you have difficulty swallowing the tablet whole, you may crush it and mix in applesauce just prior to taking your dose.  Take Xarelto exactly as prescribed by your doctor and DO NOT stop taking Xarelto without talking to the doctor who prescribed the medication.  Stopping without other VTE prevention medication to take the place of Xarelto may increase your risk of developing a clot.  After discharge, you should have regular check-up appointments with your healthcare provider that is prescribing your Xarelto.    What do you do if you miss a dose? If you miss a dose, take it as soon as you remember on the same day then continue your regularly scheduled once daily regimen the next day. Do not take two doses of Xarelto on the same day.   Important Safety Information A possible side effect of Xarelto is bleeding. You should call your healthcare provider right away if you experience any of the following: Bleeding from an injury or your nose that does not stop. Unusual colored urine (red or dark brown) or unusual colored stools (red or black). Unusual bruising for unknown reasons. A serious fall or if you hit your head (even if there is no bleeding).  Some medicines may interact with Xarelto and might increase your risk of bleeding while on Xarelto. To help avoid this, consult your healthcare provider or pharmacist prior to using any new prescription or non-prescription medications, including herbals, vitamins, non-steroidal anti-inflammatory drugs (NSAIDs) and supplements.  This website has more information on Xarelto: www.xarelto.com.    

## 2022-04-19 NOTE — Interval H&P Note (Signed)
History and Physical Interval Note:  04/19/2022 8:23 AM  Kaylee Davis  has presented today for surgery, with the diagnosis of left hip osteoarthritis.  The various methods of treatment have been discussed with the patient and family. After consideration of risks, benefits and other options for treatment, the patient has consented to  Procedure(s): TOTAL HIP ARTHROPLASTY ANTERIOR APPROACH (Left) as a surgical intervention.  The patient's history has been reviewed, patient examined, no change in status, stable for surgery.  I have reviewed the patient's chart and labs.  Questions were answered to the patient's satisfaction.     Kaylee Davis

## 2022-04-19 NOTE — Plan of Care (Signed)
  Problem: Education: Goal: Knowledge of the prescribed therapeutic regimen will improve Outcome: Progressing   Problem: Activity: Goal: Ability to avoid complications of mobility impairment will improve Outcome: Progressing   Problem: Pain Management: Goal: Pain level will decrease with appropriate interventions Outcome: Progressing

## 2022-04-19 NOTE — Anesthesia Postprocedure Evaluation (Signed)
Anesthesia Post Note  Patient: Kaylee Davis  Procedure(s) Performed: TOTAL HIP ARTHROPLASTY ANTERIOR APPROACH (Left: Hip)     Patient location during evaluation: PACU Anesthesia Type: MAC and Spinal Level of consciousness: awake and alert Pain management: pain level controlled Vital Signs Assessment: post-procedure vital signs reviewed and stable Respiratory status: spontaneous breathing, nonlabored ventilation, respiratory function stable and patient connected to nasal cannula oxygen Cardiovascular status: stable and blood pressure returned to baseline Postop Assessment: no apparent nausea or vomiting Anesthetic complications: no   No notable events documented.  Last Vitals:  Vitals:   04/19/22 1429 04/19/22 1745  BP: (!) 143/72 (!) 148/58  Pulse: 66 68  Resp: 18 17  Temp:  36.5 C  SpO2: 98% 98%    Last Pain:  Vitals:   04/19/22 1745  TempSrc: Oral  PainSc:                  March Rummage Amiel Mccaffrey

## 2022-04-19 NOTE — Op Note (Signed)
OPERATIVE REPORT- TOTAL HIP ARTHROPLASTY   PREOPERATIVE DIAGNOSIS: Osteoarthritis of the Left hip.   POSTOPERATIVE DIAGNOSIS: Osteoarthritis of the Left  hip.   PROCEDURE: Left total hip arthroplasty, anterior approach.   SURGEON: Gaynelle Arabian, MD   ASSISTANT: Shearon Balo, PA-C  ANESTHESIA:  Spinal  ESTIMATED BLOOD LOSS:-400 mL    DRAINS: None  COMPLICATIONS: None   CONDITION: PACU - hemodynamically stable.   BRIEF CLINICAL NOTE: Kaylee Davis is a 82 y.o. female who has advanced end-  stage arthritis of their Left  hip with progressively worsening pain and  dysfunction.The patient has failed nonoperative management and presents for  total hip arthroplasty.   PROCEDURE IN DETAIL: After successful administration of spinal  anesthetic, the traction boots for the [facility] bed were placed on both  feet and the patient was placed onto the [facility] bed, boots placed into the leg  holders. The Left hip was then isolated from the perineum with plastic  drapes and prepped and draped in the usual sterile fashion. ASIS and  greater trochanter were marked and a oblique incision was made, starting  at about 1 cm lateral and 2 cm distal to the ASIS and coursing towards  the anterior cortex of the femur. The skin was cut with a 10 blade  through subcutaneous tissue to the level of the fascia overlying the  tensor fascia lata muscle. The fascia was then incised in line with the  incision at the junction of the anterior third and posterior 2/3rd. The  muscle was teased off the fascia and then the interval between the TFL  and the rectus was developed. The Hohmann retractor was then placed at  the top of the femoral neck over the capsule. The vessels overlying the  capsule were cauterized and the fat on top of the capsule was removed.  A Hohmann retractor was then placed anterior underneath the rectus  femoris to give exposure to the entire anterior capsule. A T-shaped   capsulotomy was performed. The edges were tagged and the femoral head  was identified.       Osteophytes are removed off the superior acetabulum.  The femoral neck was then cut in situ with an oscillating saw. Traction  was then applied to the left lower extremity utilizing the [facility]  traction. The femoral head was then removed. Retractors were placed  around the acetabulum and then circumferential removal of the labrum was  performed. Osteophytes were also removed. Reaming starts at 45 mm to  medialize and  Increased in 2 mm increments to 47 mm. We reamed in  approximately 40 degrees of abduction, 20 degrees anteversion. A 48 mm  pinnacle acetabular shell was then impacted in anatomic position under  fluoroscopic guidance with excellent purchase. We did not need to place  any additional dome screws. A 28 mm neutral + 4 marathon liner was then  placed into the acetabular shell.       The femoral lift was then placed along the lateral aspect of the femur  just distal to the vastus ridge. The leg was  externally rotated and capsule  was stripped off the inferior aspect of the femoral neck down to the  level of the lesser trochanter, this was done with electrocautery. The femur was lifted after this was performed. The  leg was then placed in an extended and adducted position essentially delivering the femur. We also removed the capsule superiorly and the piriformis from the piriformis fossa to  gain excellent exposure of the  proximal femur. Rongeur was used to remove some cancellous bone to get  into the lateral portion of the proximal femur for placement of the  initial starter reamer. The starter broaches was placed  the starter broach  and was shown to go down the center of the canal. Broaching  with the Actis system was then performed starting at size 0  coursing  Up to size 6. A size 6 had excellent torsional and rotational  and axial stability. The trial high offset neck was then placed   with a 28 + 1.5 trial head. The hip was then reduced. We confirmed that  the stem was in the canal both on AP and lateral x-rays. It also has excellent sizing. The hip was reduced with outstanding stability through full extension and full external rotation.. AP pelvis was taken and the leg lengths were measured and found to be equal. Hip was then dislocated again and the femoral head and neck removed. The  femoral broach was removed. Size 6 Actis stem with a high offset  neck was then impacted into the femur following native anteversion. Has  excellent purchase in the canal. Excellent torsional and rotational and  axial stability. It is confirmed to be in the canal on AP and lateral  fluoroscopic views. The 28 + 1.5 metal head was placed and the hip  reduced with outstanding stability. Again AP pelvis was taken and it  confirmed that the leg lengths were equal. The wound was then copiously  irrigated with saline solution and the capsule reattached and repaired  with Ethibond suture. 30 ml of .25% Bupivicaine was  injected into the capsule and into the edge of the tensor fascia lata as well as subcutaneous tissue. The fascia overlying the tensor fascia lata was then closed with a running #1 V-Loc. Subcu was closed with interrupted 2-0 Vicryl and subcuticular running 4-0 Monocryl. Incision was cleaned  and dried. Steri-Strips and a bulky sterile dressing applied. The patient was awakened and transported to  recovery in stable condition.        Please note that a surgical assistant was a medical necessity for this procedure to perform it in a safe and expeditious manner. Assistant was necessary to provide appropriate retraction of vital neurovascular structures and to prevent femoral fracture and allow for anatomic placement of the prosthesis.  Gaynelle Arabian, M.D.

## 2022-04-19 NOTE — Transfer of Care (Signed)
Immediate Anesthesia Transfer of Care Note  Patient: Kaylee Davis  Procedure(s) Performed: TOTAL HIP ARTHROPLASTY ANTERIOR APPROACH (Left: Hip)  Patient Location: PACU  Anesthesia Type:Spinal  Level of Consciousness: awake, drowsy, and patient cooperative  Airway & Oxygen Therapy: Patient Spontanous Breathing and Patient connected to face mask oxygen  Post-op Assessment: Report given to RN and Post -op Vital signs reviewed and stable  Post vital signs: Reviewed and stable  Last Vitals:  Vitals Value Taken Time  BP 100/56 04/19/22 1115  Temp    Pulse 59 04/19/22 1116  Resp 17 04/19/22 1116  SpO2 100 % 04/19/22 1116    Last Pain:  Vitals:   04/19/22 0808  TempSrc:   PainSc: 0-No pain         Complications: No notable events documented.

## 2022-04-19 NOTE — Anesthesia Procedure Notes (Signed)
Spinal  Patient location during procedure: OR Start time: 04/19/2022 9:42 AM End time: 04/19/2022 9:44 AM Staffing Performed: anesthesiologist  Anesthesiologist: Darral Dash, DO Performed by: Darral Dash, DO Authorized by: Darral Dash, DO   Preanesthetic Checklist Completed: patient identified, IV checked, site marked, risks and benefits discussed, surgical consent, monitors and equipment checked, pre-op evaluation and timeout performed Spinal Block Patient position: sitting Prep: DuraPrep Patient monitoring: heart rate, cardiac monitor, continuous pulse ox and blood pressure Approach: midline Location: L3-4 Injection technique: single-shot Needle Needle type: Pencan  Needle gauge: 24 G Needle length: 10 cm Assessment Events: CSF return Additional Notes Patient identified. Risks/Benefits/Options discussed with patient including but not limited to bleeding, infection, nerve damage, paralysis, failed block, incomplete pain control, headache, blood pressure changes, nausea, vomiting, reactions to medications, itching and postpartum back pain. Confirmed with bedside nurse the patient's most recent platelet count. Confirmed with patient that they are not currently taking any anticoagulation, have any bleeding history or any family history of bleeding disorders. Patient expressed understanding and wished to proceed. All questions were answered. Sterile technique was used throughout the entire procedure. Please see nursing notes for vital signs. Warning signs of high block given to the patient including shortness of breath, tingling/numbness in hands, complete motor block, or any concerning symptoms with instructions to call for help. Patient was given instructions on fall risk and not to get out of bed. All questions and concerns addressed with instructions to call with any issues or inadequate analgesia.

## 2022-04-19 NOTE — Evaluation (Signed)
Physical Therapy Evaluation Patient Details Name: Kaylee Davis MRN: [id number] DOB: July 11, 1940 Today's Date: 04/19/2022  History of Present Illness  Pt is an 82yo female presenting s/p L-THA, AA on 04/19/22. PMH: GERD, HTN, R-TKA, L-TKA, R-THA, HLD, CKD3  Clinical Impression  Kaylee Davis is a 82 y.o. female POD 0 s/p L-THA, AA. Patient reports modified independence using RW for household mobility and SPC for community mobility at baseline. Patient is now limited by functional impairments (see PT problem list below) and requires min assist for bed mobility and min guard for transfers, pt reporting high degree of pain so ambulation was not attempted during evaluation Patient instructed in exercise to facilitate ROM and circulation to manage edema. Provided incentive spirometer and with Vcs pt able to achieve 1539mL. Patient will benefit from continued skilled PT interventions to address impairments and progress towards PLOF. Acute PT will follow to progress mobility and stair training in preparation for safe discharge home.       Recommendations for follow up therapy are one component of a multi-disciplinary discharge planning process, led by the attending physician.  Recommendations may be updated based on patient status, additional functional criteria and insurance authorization.  Follow Up Recommendations Follow physician's recommendations for discharge plan and follow up therapies      Assistance Recommended at Discharge Frequent or constant Supervision/Assistance  Patient can return home with the following  A little help with walking and/or transfers;A little help with bathing/dressing/bathroom;Assistance with cooking/housework;Assist for transportation;Help with stairs or ramp for entrance    Equipment Recommendations None recommended by PT  Recommendations for Other Services       Functional Status Assessment Patient has had a recent decline in their functional status and  demonstrates the ability to make significant improvements in function in a reasonable and predictable amount of time.     Precautions / Restrictions Precautions Precautions: Fall Restrictions Weight Bearing Restrictions: No Other Position/Activity Restrictions: wbat      Mobility  Bed Mobility Overal bed mobility: Needs Assistance Bed Mobility: Supine to Sit     Supine to sit: Min assist, HOB elevated     General bed mobility comments: min assist for trunk elevation and scooting hips to EOB via chuck pad.    Transfers Overall transfer level: Needs assistance Equipment used: Rolling walker (2 wheels) Transfers: Sit to/from Stand, Bed to chair/wheelchair/BSC Sit to Stand: Min guard   Step pivot transfers: Min guard       General transfer comment: Sit to stand: min guard for safety only, VCs for sequencing. Step pivot: pt took several steps )(3) foward to be able to turn and sit in recliner, min guard, VCs for sequencing. Further mobility deferred as pt reporting high degree of soreness though with additional queries pt unable to report a pain level. RN notified.    Ambulation/Gait               General Gait Details: deferred  Stairs            Wheelchair Mobility    Modified Rankin (Stroke Patients Only)       Balance Overall balance assessment: Needs assistance Sitting-balance support: Feet supported, No upper extremity supported Sitting balance-Leahy Scale: Good     Standing balance support: Reliant on assistive device for balance, During functional activity, Bilateral upper extremity supported Standing balance-Leahy Scale: Poor  Pertinent Vitals/Pain Pain Assessment Pain Assessment: Faces Faces Pain Scale: Hurts little more Pain Location: L hip Pain Descriptors / Indicators: Operative site guarding Pain Intervention(s): Limited activity within patient's tolerance, Monitored during session,  Repositioned, Ice applied    Home Living Family/patient expects to be discharged to:: Private residence Living Arrangements: Spouse/significant other Available Help at Discharge: Family;Available 24 hours/day Type of Home: House Home Access: Stairs to enter Entrance Stairs-Rails: None Entrance Stairs-Number of Steps: 1 (Threshold) Alternate Level Stairs-Number of Steps: Flight of stairs Home Layout: Bed/bath upstairs;Two level (Has stair lift. Pt has to walk about 10' feet from door to stair lift.) Home Equipment: Sharon (2 wheels);Cane - single point;Crutches;BSC/3in1;Toilet riser;Grab bars - tub/shower      Prior Function Prior Level of Function : Independent/Modified Independent             Mobility Comments: RW for household mobility, SPC for community ADLs Comments: IND     Hand Dominance   Dominant Hand: Right    Extremity/Trunk Assessment   Upper Extremity Assessment Upper Extremity Assessment: Overall WFL for tasks assessed    Lower Extremity Assessment Lower Extremity Assessment: RLE deficits/detail;LLE deficits/detail RLE Deficits / Details: MMT ank DF/PF 5/5 RLE Sensation: WNL LLE Deficits / Details: MMT ank DF/PF 5/5 LLE Sensation: decreased light touch    Cervical / Trunk Assessment Cervical / Trunk Assessment: Kyphotic  Communication   Communication: No difficulties  Cognition Arousal/Alertness: Awake/alert Behavior During Therapy: WFL for tasks assessed/performed Overall Cognitive Status: Within Functional Limits for tasks assessed                                          General Comments      Exercises Total Joint Exercises Ankle Circles/Pumps: AROM, Both, 20 reps   Assessment/Plan    PT Assessment Patient needs continued PT services  PT Problem List Decreased strength;Decreased range of motion;Decreased activity tolerance;Decreased balance;Decreased mobility;Pain       PT Treatment Interventions DME  instruction;Gait training;Stair training;Functional mobility training;Therapeutic activities;Therapeutic exercise;Balance training;Neuromuscular re-education;Patient/family education    PT Goals (Current goals can be found in the Care Plan section)  Acute Rehab PT Goals Patient Stated Goal: Walk without pain PT Goal Formulation: With patient Time For Goal Achievement: 04/26/22 Potential to Achieve Goals: Good    Frequency 7X/week     Co-evaluation               AM-PAC PT "6 Clicks" Mobility  Outcome Measure Help needed turning from your back to your side while in a flat bed without using bedrails?: None Help needed moving from lying on your back to sitting on the side of a flat bed without using bedrails?: A Little Help needed moving to and from a bed to a chair (including a wheelchair)?: A Little Help needed standing up from a chair using your arms (e.g., wheelchair or bedside chair)?: A Little Help needed to walk in hospital room?: A Little Help needed climbing 3-5 steps with a railing? : A Little 6 Click Score: 19    End of Session Equipment Utilized During Treatment: Gait belt Activity Tolerance: Patient limited by pain Patient left: in chair;with call bell/phone within reach;with chair alarm set;with SCD's reapplied Nurse Communication: Mobility status PT Visit Diagnosis: Pain;Difficulty in walking, not elsewhere classified (R26.2) Pain - Right/Left: Left Pain - part of body: Hip    Time: 1308-6578 PT Time  Calculation (min) (ACUTE ONLY): 19 min   Charges:   PT Evaluation $PT Eval Low Complexity: 1 Low          Coolidge Breeze, PT, DPT [facility] Department Office: [phone] Weekend pager: [phone]  Coolidge Breeze 04/19/2022, 8:03 PM

## 2022-04-20 ENCOUNTER — Encounter (HOSPITAL_COMMUNITY): Payer: Self-pay | Admitting: Orthopedic Surgery

## 2022-04-20 DIAGNOSIS — Z96641 Presence of right artificial hip joint: Secondary | ICD-10-CM | POA: Diagnosis not present

## 2022-04-20 DIAGNOSIS — Z96653 Presence of artificial knee joint, bilateral: Secondary | ICD-10-CM | POA: Diagnosis not present

## 2022-04-20 DIAGNOSIS — M1612 Unilateral primary osteoarthritis, left hip: Secondary | ICD-10-CM | POA: Diagnosis not present

## 2022-04-20 DIAGNOSIS — I129 Hypertensive chronic kidney disease with stage 1 through stage 4 chronic kidney disease, or unspecified chronic kidney disease: Secondary | ICD-10-CM | POA: Diagnosis not present

## 2022-04-20 DIAGNOSIS — Z79899 Other long term (current) drug therapy: Secondary | ICD-10-CM | POA: Diagnosis not present

## 2022-04-20 DIAGNOSIS — N183 Chronic kidney disease, stage 3 unspecified: Secondary | ICD-10-CM | POA: Diagnosis not present

## 2022-04-20 LAB — CBC
HCT: 33.8 % — ABNORMAL LOW (ref 36.0–46.0)
Hemoglobin: 10.8 g/dL — ABNORMAL LOW (ref 12.0–15.0)
MCH: 28.5 pg (ref 26.0–34.0)
MCHC: 32 g/dL (ref 30.0–36.0)
MCV: 89.2 fL (ref 80.0–100.0)
Platelets: 257 10*3/uL (ref 150–400)
RBC: 3.79 MIL/uL — ABNORMAL LOW (ref 3.87–5.11)
RDW: 12.9 % (ref 11.5–15.5)
WBC: 14 10*3/uL — ABNORMAL HIGH (ref 4.0–10.5)
nRBC: 0 % (ref 0.0–0.2)

## 2022-04-20 LAB — BASIC METABOLIC PANEL WITH GFR
Anion gap: 8 (ref 5–15)
BUN: 9 mg/dL (ref 8–23)
CO2: 23 mmol/L (ref 22–32)
Calcium: 8.3 mg/dL — ABNORMAL LOW (ref 8.9–10.3)
Chloride: 106 mmol/L (ref 98–111)
Creatinine, Ser: 0.75 mg/dL (ref 0.44–1.00)
GFR, Estimated: >60 mL/min
Glucose, Bld: 135 mg/dL — ABNORMAL HIGH (ref 70–99)
Potassium: 3.7 mmol/L (ref 3.5–5.1)
Sodium: 137 mmol/L (ref 135–145)

## 2022-04-20 MED ORDER — TRAMADOL HCL 50 MG PO TABS: 50.0000 mg | ORAL_TABLET | Freq: Four times a day (QID) | ORAL | 0 refills | Status: DC | PRN

## 2022-04-20 MED ORDER — METHOCARBAMOL 500 MG PO TABS: 500.0000 mg | ORAL_TABLET | Freq: Four times a day (QID) | ORAL | 0 refills | Status: DC | PRN

## 2022-04-20 MED ORDER — HYDROCODONE-ACETAMINOPHEN 5-325 MG PO TABS: 1.0000 | ORAL_TABLET | Freq: Four times a day (QID) | ORAL | 0 refills | Status: DC | PRN

## 2022-04-20 MED ORDER — RIVAROXABAN 10 MG PO TABS: 10.0000 mg | ORAL_TABLET | Freq: Every day | ORAL | 0 refills | Status: DC

## 2022-04-20 NOTE — Progress Notes (Signed)
   Subjective: 1 Day Post-Op Procedure(s) (LRB): TOTAL HIP ARTHROPLASTY ANTERIOR APPROACH (Left) Patient reports pain as mild.   Patient seen in rounds by Dr. Wynelle Link. Patient is well, and has had no acute complaints or problems. Denies chest pain or SOB. No issues overnight. Foley catheter removed this AM. We will continue therapy today, unable to ambulate with PT last night due to pain.  Objective: Vital signs in last 24 hours: Temp:  [97.4 F (36.3 C)-98.9 F (37.2 C)] 97.9 F (36.6 C) (02/01 0440) Pulse Rate:  [54-77] 64 (02/01 0440) Resp:  [12-18] 16 (02/01 0440) BP: (100-153)/(55-89) 145/79 (02/01 0440) SpO2:  [95 %-100 %] 98 % (02/01 0440) Weight:  [73 kg] 73 kg (01/31 0808)  Intake/Output from previous day:  Intake/Output Summary (Last 24 hours) at 04/20/2022 0733 Last data filed at 04/20/2022 0600 Gross per 24 hour  Intake 2438.55 ml  Output 4275 ml  Net -1836.45 ml     Intake/Output this shift: No intake/output data recorded.  Labs: Recent Labs    04/20/22 0326  HGB 10.8*   Recent Labs    04/20/22 0326  WBC 14.0*  RBC 3.79*  HCT 33.8*  PLT 257   Recent Labs    04/20/22 0326  NA 137  K 3.7  CL 106  CO2 23  BUN 9  CREATININE 0.75  GLUCOSE 135*  CALCIUM 8.3*   No results for input(s): "LABPT", "INR" in the last 72 hours.  Exam: General - Patient is Alert and Oriented Extremity - Neurovascular intact Dorsiflexion/Plantar flexion intact Dressing - dressing C/D/I Motor Function - intact, moving foot and toes well on exam.   Past Medical History:  Diagnosis Date   Anxiety    Arthritis    OA BOTH KNEES; OCCAS PAIN LEFT SHOULDER - HX OF ROTATOR CUFF PROBLEM   Dyspepsia    GERD (gastroesophageal reflux disease)    GI bleed    GI BLEED FROM DUODENAL ULCER--ABOUT 17 YRS AGO - GIVEN TRANSFUSIONS   History of blood transfusion    1990's   History of frequent urinary tract infections    History of PSVT (paroxysmal supraventricular tachycardia)     can control by doing a valsalva. Triggered by elevated heart rate   Hypertension    Imbalance    Migraine headache    much reduced frequency   Tachycardia    Episodic   Tingling    left foot    TMJ (temporomandibular joint disorder)    PAST HX GRINDING TEETH --BUT NO LONGER A PROBLEM   Wears glasses     Assessment/Plan: 1 Day Post-Op Procedure(s) (LRB): TOTAL HIP ARTHROPLASTY ANTERIOR APPROACH (Left) Principal Problem:   OA (osteoarthritis) of hip Active Problems:   Primary osteoarthritis of left hip  Estimated body mass index is 25.21 kg/m as calculated from the following:   Height as of this encounter: 5\' 7"  (1.702 m).   Weight as of this encounter: 73 kg.   DVT Prophylaxis - Xarelto Weight bearing as tolerated. Continue therapy today.  Plan is to go Home after hospital stay. Plan for discharge with HEP later today if progresses with therapy and meeting goals. Follow-up in the office in 2 weeks.  The PDMP database was reviewed today prior to any opioid medications being prescribed to this patient.   Shearon Balo, PA-C Orthopedic Surgery 04/20/2022, 7:33 AM

## 2022-04-20 NOTE — Plan of Care (Signed)
  Problem: Education: Goal: Knowledge of the prescribed therapeutic regimen will improve Outcome: Progressing   Problem: Pain Management: Goal: Pain level will decrease with appropriate interventions Outcome: Progressing   Problem: Activity: Goal: Risk for activity intolerance will decrease Outcome: Progressing   

## 2022-04-20 NOTE — TOC Transition Note (Signed)
Transition of Care [facility]) - CM/SW Discharge Note   Patient Details  Name: Kaylee Davis MRN: [id number] Date of Birth: 13-Jan-1941  Transition of Care [facility] CM/SW Contact:  Lennart Pall, LCSW Phone Number: 04/20/2022, 11:46 AM   Clinical Narrative:     Met with pt and confirming she has needed DME at home.  Plan for HEP.  No TOC needs.  Final next level of care: Home/Self Care Barriers to Discharge: No Barriers Identified   Patient Goals and CMS Choice      Discharge Placement                         Discharge Plan and Services Additional resources added to the After Visit Summary for                  DME Arranged: N/A DME Agency: NA                  Social Determinants of Health (SDOH) Interventions SDOH Screenings   Food Insecurity: No Food Insecurity (03/10/2022)  Housing: Low Risk  (04/19/2022)  Transportation Needs: No Transportation Needs (03/10/2022)  Utilities: Not At Risk (03/10/2022)  Alcohol Screen: Low Risk  (03/10/2022)  Depression (PHQ2-9): Low Risk  (03/10/2022)  Financial Resource Strain: Low Risk  (03/10/2022)  Physical Activity: Sufficiently Active (03/10/2022)  Social Connections: Socially Integrated (03/10/2022)  Stress: No Stress Concern Present (03/10/2022)  Tobacco Use: Low Risk  (04/20/2022)     Readmission Risk Interventions     No data to display

## 2022-04-20 NOTE — Progress Notes (Signed)
Physical Therapy Treatment Patient Details Name: Kaylee Davis MRN: [id number] DOB: 1940/10/31 Today's Date: 04/20/2022   History of Present Illness Pt is an 82yo female presenting s/p L-THA, AA on 04/19/22. PMH: GERD, HTN, R-TKA, L-TKA, R-THA, HLD, CKD3    PT Comments    Pt ambulated in hallway, practiced stairs, and performed LE exercises.  Pt also educated on standing exercises to add to HEP in a couple days (which therapist demonstrated and pt reviewed on HEP handout).  Pt feels comfortable with d/c home today.  Pt had no further questions.    Recommendations for follow up therapy are one component of a multi-disciplinary discharge planning process, led by the attending physician.  Recommendations may be updated based on patient status, additional functional criteria and insurance authorization.  Follow Up Recommendations  Follow physician's recommendations for discharge plan and follow up therapies     Assistance Recommended at Discharge Frequent or constant Supervision/Assistance  Patient can return home with the following A little help with walking and/or transfers;A little help with bathing/dressing/bathroom;Assistance with cooking/housework;Assist for transportation;Help with stairs or ramp for entrance   Equipment Recommendations  None recommended by PT    Recommendations for Other Services       Precautions / Restrictions Precautions Precautions: Fall Restrictions Other Position/Activity Restrictions: WBAT     Mobility  Bed Mobility               General bed mobility comments: pt in recliner    Transfers Overall transfer level: Needs assistance Equipment used: Rolling walker (2 wheels) Transfers: Sit to/from Stand Sit to Stand: Min guard           General transfer comment: verbal cues for positioning    Ambulation/Gait Ambulation/Gait assistance: Min guard, Supervision Gait Distance (Feet): 200 Feet Assistive device: Rolling walker (2  wheels) Gait Pattern/deviations: Step-to pattern, Decreased stance time - left, Antalgic       General Gait Details: verbal cues for sequence, RW positioning, step length; pt reports feeling stiff initially however improved with distance   Stairs Stairs: Yes Stairs assistance: Min guard Stair Management: Step to pattern, Two rails Number of Stairs: 4 General stair comments: cues for sequence, pt reports understanding   Wheelchair Mobility    Modified Rankin (Stroke Patients Only)       Balance                                            Cognition Arousal/Alertness: Awake/alert Behavior During Therapy: WFL for tasks assessed/performed Overall Cognitive Status: Within Functional Limits for tasks assessed                                          Exercises Total Joint Exercises Ankle Circles/Pumps: AROM, Both, 15 reps Quad Sets: AROM, Both, 10 reps Heel Slides: AAROM, Left, 10 reps Hip ABduction/ADduction: AAROM, Left, 10 reps Long Arc Quad: AROM, Left, 10 reps, Seated    General Comments        Pertinent Vitals/Pain Pain Assessment Pain Assessment: 0-10 Pain Score: 2  Pain Location: L hip Pain Descriptors / Indicators: Sore Pain Intervention(s): Repositioned, Monitored during session    Home Living  Prior Function            PT Goals (current goals can now be found in the care plan section) Progress towards PT goals: Progressing toward goals    Frequency    7X/week      PT Plan Current plan remains appropriate    Co-evaluation              AM-PAC PT "6 Clicks" Mobility   Outcome Measure  Help needed turning from your back to your side while in a flat bed without using bedrails?: None Help needed moving from lying on your back to sitting on the side of a flat bed without using bedrails?: A Little Help needed moving to and from a bed to a chair (including a  wheelchair)?: A Little Help needed standing up from a chair using your arms (e.g., wheelchair or bedside chair)?: A Little Help needed to walk in hospital room?: A Little Help needed climbing 3-5 steps with a railing? : A Little 6 Click Score: 19    End of Session Equipment Utilized During Treatment: Gait belt Activity Tolerance: Patient tolerated treatment well Patient left: in chair;with call bell/phone within reach;with chair alarm set   PT Visit Diagnosis: Difficulty in walking, not elsewhere classified (R26.2)     Time: 4656-8127 PT Time Calculation (min) (ACUTE ONLY): 26 min  Charges:  $Gait Training: 8-22 mins $Therapeutic Exercise: 8-22 mins                    Jannette Spanner PT, DPT Physical Therapist Acute Rehabilitation Services Preferred contact method: Secure Chat Weekend Pager Only: [phone] Office: [place] 04/20/2022, 12:14 PM

## 2022-04-26 NOTE — Discharge Summary (Signed)
Physician Discharge Summary   Patient ID: Kaylee Davis MRN: [id number] DOB/AGE: Apr 19, 1940 82 y.o.  Admit date: 04/19/2022 Discharge date: 04/20/2022  Primary Diagnosis: Left hip osteoarthritis   Admission Diagnoses:  Past Medical History:  Diagnosis Date   Anxiety    Arthritis    OA BOTH KNEES; OCCAS PAIN LEFT SHOULDER - HX OF ROTATOR CUFF PROBLEM   Dyspepsia    GERD (gastroesophageal reflux disease)    GI bleed    GI BLEED FROM DUODENAL ULCER--ABOUT 17 YRS AGO - GIVEN TRANSFUSIONS   History of blood transfusion    1990's   History of frequent urinary tract infections    History of PSVT (paroxysmal supraventricular tachycardia)    can control by doing a valsalva. Triggered by elevated heart rate   Hypertension    Imbalance    Migraine headache    much reduced frequency   Tachycardia    Episodic   Tingling    left foot    TMJ (temporomandibular joint disorder)    PAST HX GRINDING TEETH --BUT NO LONGER A PROBLEM   Wears glasses    Discharge Diagnoses:   Principal Problem:   OA (osteoarthritis) of hip Active Problems:   Primary osteoarthritis of left hip  Estimated body mass index is 25.21 kg/m as calculated from the following:   Height as of this encounter: 5\' 7"  (1.702 m).   Weight as of this encounter: 73 kg.  Procedure:  Procedure(s) (LRB): TOTAL HIP ARTHROPLASTY ANTERIOR APPROACH (Left)   Consults: None  HPI: Kaylee Davis is a 82 y.o. female who has advanced end-  stage arthritis of their Left hip with progressively worsening pain and  dysfunction.The patient has failed nonoperative management and presents for total hip arthroplasty.   Laboratory Data: Admission on 04/19/2022, Discharged on 04/20/2022  Component Date Value Ref Range Status   WBC 04/20/2022 14.0 (H)  4.0 - 10.5 K/uL Final   RBC 04/20/2022 3.79 (L)  3.87 - 5.11 MIL/uL Final   Hemoglobin 04/20/2022 10.8 (L)  12.0 - 15.0 g/dL Final   HCT 04/20/2022 33.8 (L)  36.0 - 46.0 % Final    MCV 04/20/2022 89.2  80.0 - 100.0 fL Final   MCH 04/20/2022 28.5  26.0 - 34.0 pg Final   MCHC 04/20/2022 32.0  30.0 - 36.0 g/dL Final   RDW 04/20/2022 12.9  11.5 - 15.5 % Final   Platelets 04/20/2022 257  150 - 400 K/uL Final   nRBC 04/20/2022 0.0  0.0 - 0.2 % Final   Performed at [facility], [place] [address], [place], [place] [address]   Sodium 04/20/2022 137  135 - 145 mmol/L Final   Potassium 04/20/2022 3.7  3.5 - 5.1 mmol/L Final   Chloride 04/20/2022 106  98 - 111 mmol/L Final   CO2 04/20/2022 23  22 - 32 mmol/L Final   Glucose, Bld 04/20/2022 135 (H)  70 - 99 mg/dL Final   Glucose reference range applies only to samples taken after fasting for at least 8 hours.   BUN 04/20/2022 9  8 - 23 mg/dL Final   Creatinine, Ser 04/20/2022 0.75  0.44 - 1.00 mg/dL Final   Calcium 04/20/2022 8.3 (L)  8.9 - 10.3 mg/dL Final   GFR, Estimated 04/20/2022 >60  >60 mL/min Final   Comment: (NOTE) Calculated using the CKD-EPI Creatinine Equation (2021)    Anion gap 04/20/2022 8  5 - 15 Final   Performed at [facility], [place]  [place], [place] [address]  Hospital Outpatient Visit on 04/07/2022  Component Date Value Ref Range Status   Sodium 04/07/2022 137  135 - 145 mmol/L Final   Potassium 04/07/2022 4.3  3.5 - 5.1 mmol/L Final   Chloride 04/07/2022 103  98 - 111 mmol/L Final   CO2 04/07/2022 25  22 - 32 mmol/L Final   Glucose, Bld 04/07/2022 113 (H)  70 - 99 mg/dL Final   Glucose reference range applies only to samples taken after fasting for at least 8 hours.   BUN 04/07/2022 12  8 - 23 mg/dL Final   Creatinine, Ser 04/07/2022 0.98  0.44 - 1.00 mg/dL Final   Calcium 04/07/2022 8.8 (L)  8.9 - 10.3 mg/dL Final   GFR, Estimated 04/07/2022 57 (L)  >60 mL/min Final   Comment: (NOTE) Calculated using the CKD-EPI Creatinine Equation (2021)    Anion gap 04/07/2022 9  5 - 15 Final   Performed at [facility], [place] [address]  [place], [place] [address]   WBC 04/07/2022 7.6  4.0 - 10.5 K/uL Final   RBC 04/07/2022 4.63  3.87 - 5.11 MIL/uL Final   Hemoglobin 04/07/2022 13.2  12.0 - 15.0 g/dL Final   HCT 04/07/2022 41.7  36.0 - 46.0 % Final   MCV 04/07/2022 90.1  80.0 - 100.0 fL Final   MCH 04/07/2022 28.5  26.0 - 34.0 pg Final   MCHC 04/07/2022 31.7  30.0 - 36.0 g/dL Final   RDW 04/07/2022 13.1  11.5 - 15.5 % Final   Platelets 04/07/2022 314  150 - 400 K/uL Final   nRBC 04/07/2022 0.0  0.0 - 0.2 % Final   Performed at [facility], [place] [address], [place], [place] [address]   ABO/RH(D) 04/07/2022 O POS   Final   Antibody Screen 04/07/2022 NEG   Final   Sample Expiration 04/07/2022 04/21/2022,2359   Final   Extend sample reason 04/07/2022    Final                   Value:NO TRANSFUSIONS OR PREGNANCY IN THE PAST 3 MONTHS Performed at [place] [address], [place], [place] [address]    MRSA, PCR 04/07/2022 NEGATIVE  NEGATIVE Final   Staphylococcus aureus 04/07/2022 NEGATIVE  NEGATIVE Final   Comment: (NOTE) The Xpert SA Assay (FDA approved for NASAL specimens in patients 63 years of age and older), is one component of a comprehensive surveillance program. It is not intended to diagnose infection nor to guide or monitor treatment. Performed at [facility], [place] [address], [place], [place] [address]      X-Rays:DG Pelvis Portable  Result Date: 04/19/2022 CLINICAL DATA:  Status post left hip arthroplasty. EXAM: PORTABLE PELVIS 1-2 VIEWS COMPARISON:  Pelvis radiograph 03/06/2018 FINDINGS: Left hip arthroplasty in expected alignment. No periprosthetic lucency or fracture. Questionable thinning of the central acetabulum. Recent postsurgical change includes air and edema in the soft tissues. Previous right hip arthroplasty is intact. IMPRESSION: Left hip arthroplasty without immediate postoperative complication. Electronically Signed   By: Keith Rake  M.D.   On: 04/19/2022 12:40   DG HIP UNILAT WITH PELVIS 2-3 VIEWS LEFT  Result Date: 04/19/2022 CLINICAL DATA:  Hip replacement surgery EXAM: DG HIP (WITH OR WITHOUT PELVIS) 2-3V LEFT COMPARISON:  07/04/2017 FINDINGS: Interval left hip arthroplasty. Right hip arthroplasty components partially visualized. Components project in expected location. No fracture or dislocation. IMPRESSION: Left hip arthroplasty without apparent complication. Electronically  Signed   By: Corlis Leak M.D.   On: 04/19/2022 11:01   DG C-Arm 1-60 Min-No Report  Result Date: 04/19/2022 Fluoroscopy was utilized by the requesting physician.  No radiographic interpretation.   DG C-Arm 1-60 Min-No Report  Result Date: 04/19/2022 Fluoroscopy was utilized by the requesting physician.  No radiographic interpretation.    EKG: Orders placed or performed in visit on 11/11/21   EKG 12-Lead     Hospital Course: Kaylee Davis is a 82 y.o. who was admitted to [facility]. They were brought to the operating room on 04/19/2022 and underwent Procedure(s): TOTAL HIP ARTHROPLASTY ANTERIOR APPROACH.  Patient tolerated the procedure well and was later transferred to the recovery room and then to the orthopaedic floor for postoperative care. They were given PO and IV analgesics for pain control following their surgery. They were given 24 hours of postoperative antibiotics of  Anti-infectives (From admission, onward)    Start     Dose/Rate Route Frequency Ordered Stop   04/19/22 1530  ceFAZolin (ANCEF) IVPB 2g/100 mL premix        2 g 200 mL/hr over 30 Minutes Intravenous Every 6 hours 04/19/22 1255 04/19/22 2120   04/19/22 0745  ceFAZolin (ANCEF) IVPB 2g/100 mL premix        2 g 200 mL/hr over 30 Minutes Intravenous On call to O.R. 04/19/22 8416 04/19/22 0953      and started on DVT prophylaxis in the form of Xarelto.   PT and OT were ordered for total joint protocol. Discharge planning consulted to help with postop  disposition and equipment needs.  Patient had an uneventful night on the evening of surgery. They started to work with PT on POD 0. Pt was seen during rounds and was ready to go home pending progress with therapy. She worked with therapy on POD #1 and was meeting her goals. Pt was discharged to home later that day in stable condition.  Diet: Regular diet Activity: WBAT Follow-up: in 2 weeks Disposition: Home Discharged Condition: good    Allergies as of 04/20/2022       Reactions   Nitrofuran Derivatives Other (See Comments)   Chest pain and elevated liver enzymes   Other    PT STATES THE SULFA AND PENICILLIN SHE TOOK MANY YEARS AGO BOTH HAD RED DYES--SO SHE WONDERED IF ALLERGIC TO RED DYES   Penicillins Hives   Has had penicillin since and has been fine.    Sulfonamide Derivatives Hives        Medication List     TAKE these medications    amLODipine 5 MG tablet Commonly known as: NORVASC TAKE 1 TABLET(5 MG) BY MOUTH DAILY   Banophen 25 MG tablet Generic drug: diphenhydrAMINE Take 1 tablet (25 mg total) by mouth every 6 (six) hours as needed for up to 3 days.   benzonatate 100 MG capsule Commonly known as: TESSALON Take 1 capsule (100 mg total) by mouth every 8 (eight) hours.   EPINEPHrine 0.3 mg/0.3 mL Soaj injection Commonly known as: EPI-PEN Inject 0.3 mg into the muscle as needed for anaphylaxis.   esomeprazole 20 MG capsule Commonly known as: NEXIUM Take 20 mg by mouth daily at 12 noon.   HYDROcodone-acetaminophen 5-325 MG tablet Commonly known as: NORCO/VICODIN Take 1-2 tablets by mouth every 6 (six) hours as needed for severe pain.   hydrOXYzine 25 MG tablet Commonly known as: ATARAX Take 1 tablet (25 mg total) by mouth 2 (two) times daily as  needed.   methocarbamol 500 MG tablet Commonly known as: ROBAXIN Take 1 tablet (500 mg total) by mouth every 6 (six) hours as needed for muscle spasms.   montelukast 10 MG tablet Commonly known as:  SINGULAIR Take 1 tablet (10 mg total) by mouth at bedtime.   multivitamin with minerals Tabs tablet Take 1 tablet by mouth daily.   ondansetron 4 MG disintegrating tablet Commonly known as: ZOFRAN-ODT 4mg  ODT q4 hours prn nausea/vomit   Premarin vaginal cream Generic drug: conjugated estrogens Place 1 Applicatorful vaginally once a week.   rivaroxaban 10 MG Tabs tablet Commonly known as: XARELTO Take 1 tablet (10 mg total) by mouth daily with breakfast for 20 days.   traMADol 50 MG tablet Commonly known as: ULTRAM Take 1-2 tablets (50-100 mg total) by mouth every 6 (six) hours as needed for moderate pain.   triamcinolone cream 0.1 % Commonly known as: KENALOG Apply 1 application topically 2 (two) times daily.        Follow-up Information     Gaynelle Arabian, MD Follow up in 2 week(s).   Specialty: Orthopedic Surgery Contact information: [address] [place] [address] [phone]                 Signed: Shearon Balo, PA-C Orthopedic Surgery 04/26/2022, 1:16 PM

## 2022-06-06 DIAGNOSIS — Z5189 Encounter for other specified aftercare: Secondary | ICD-10-CM | POA: Diagnosis not present

## 2022-06-12 ENCOUNTER — Encounter: Payer: Self-pay | Admitting: Internal Medicine

## 2022-06-12 ENCOUNTER — Ambulatory Visit (INDEPENDENT_AMBULATORY_CARE_PROVIDER_SITE_OTHER): Payer: Medicare Other | Admitting: Internal Medicine

## 2022-06-12 VITALS — BP 122/68 | HR 69 | Temp 98.2°F | Ht 67.0 in | Wt 165.0 lb

## 2022-06-12 DIAGNOSIS — R053 Chronic cough: Secondary | ICD-10-CM

## 2022-06-12 NOTE — Progress Notes (Unsigned)
   Subjective:   Patient ID: Kaylee Davis, female    DOB: 1940/06/29, 82 y.o.   MRN: [id number]  HPI The patient is an 83 YO female coming in for intermittent hoarseness. Has GERD and was every other day on medicine for some time without problems. Did return to daily about 1 week ago no improvement. Symptoms ongoing for 2-3 weeks. Prior episode last fall lasted several months and she does not want this to go on. Husband is hard of hearing she is not sure if straining to talk could impact this. Recent CXR and treatment without clear improvement.  Review of Systems  Constitutional: Negative.   HENT:  Positive for voice change.   Eyes: Negative.   Respiratory:  Positive for cough. Negative for chest tightness and shortness of breath.   Cardiovascular:  Negative for chest pain, palpitations and leg swelling.  Gastrointestinal:  Negative for abdominal distention, abdominal pain, constipation, diarrhea, nausea and vomiting.  Musculoskeletal: Negative.   Skin: Negative.   Neurological: Negative.   Psychiatric/Behavioral: Negative.      Objective:  Physical Exam Constitutional:      Appearance: She is well-developed.  HENT:     Head: Normocephalic and atraumatic.     Comments: Oropharynx with redness and clear drainage, nose with swollen turbinates, TMs normal bilaterally.  Neck:     Thyroid: No thyromegaly.  Cardiovascular:     Rate and Rhythm: Normal rate and regular rhythm.  Pulmonary:     Effort: Pulmonary effort is normal. No respiratory distress.     Breath sounds: Normal breath sounds. No wheezing or rales.  Abdominal:     General: Bowel sounds are normal. There is no distension.     Palpations: Abdomen is soft.     Tenderness: There is no abdominal tenderness. There is no rebound.  Musculoskeletal:        General: No tenderness.     Cervical back: Normal range of motion.  Lymphadenopathy:     Cervical: No cervical adenopathy.  Skin:    General: Skin is warm and dry.   Neurological:     Mental Status: She is alert and oriented to person, place, and time.     Coordination: Coordination normal.     Vitals:   06/12/22 1546  BP: 122/68  Pulse: 69  Temp: 98.2 F (36.8 C)  TempSrc: Oral  SpO2: 98%  Weight: 165 lb (74.8 kg)  Height: 5\' 7"  (1.702 m)    Assessment & Plan:  Visit time 15 minutes in face to face communication with patient and coordination of care, additional 5 minutes spent in record review, coordination or care, ordering tests, communicating/referring to other healthcare professionals, documenting in medical records all on the same day of the visit for total time 20 minutes spent on the visit.

## 2022-06-12 NOTE — Patient Instructions (Signed)
We will get you in with the ear nose and throat doctor to help [place] figure out the cause of the cough.

## 2022-06-13 DIAGNOSIS — R053 Chronic cough: Secondary | ICD-10-CM | POA: Insufficient documentation

## 2022-06-13 NOTE — Assessment & Plan Note (Signed)
We discussed etiology of chronic cough to be post nasal drip or GERD or combination. She has increased PPI to daily and asked her to continue. This can take 1-2 months to resolve after this. She is also having more PND due to pollen and is taking medication for this as well which she will continue. Referral to ENT per request she is concerned about her vocal cords and prefers direct visualization if not resolving. Referral done.

## 2022-06-26 DIAGNOSIS — H119 Unspecified disorder of conjunctiva: Secondary | ICD-10-CM | POA: Diagnosis not present

## 2022-07-12 DIAGNOSIS — K080 Exfoliation of teeth due to systemic causes: Secondary | ICD-10-CM | POA: Diagnosis not present

## 2022-07-18 DIAGNOSIS — H0015 Chalazion left lower eyelid: Secondary | ICD-10-CM | POA: Diagnosis not present

## 2022-07-21 ENCOUNTER — Emergency Department (HOSPITAL_BASED_OUTPATIENT_CLINIC_OR_DEPARTMENT_OTHER): Payer: Medicare Other

## 2022-07-21 ENCOUNTER — Other Ambulatory Visit (HOSPITAL_BASED_OUTPATIENT_CLINIC_OR_DEPARTMENT_OTHER): Payer: Self-pay

## 2022-07-21 ENCOUNTER — Other Ambulatory Visit: Payer: Self-pay

## 2022-07-21 ENCOUNTER — Emergency Department (HOSPITAL_BASED_OUTPATIENT_CLINIC_OR_DEPARTMENT_OTHER)
Admission: EM | Admit: 2022-07-21 | Discharge: 2022-07-21 | Disposition: A | Discharge status: Home / Self Care | Payer: Medicare Other | Attending: Emergency Medicine | Admitting: Emergency Medicine

## 2022-07-21 ENCOUNTER — Encounter (HOSPITAL_BASED_OUTPATIENT_CLINIC_OR_DEPARTMENT_OTHER): Payer: Self-pay | Admitting: Radiology

## 2022-07-21 DIAGNOSIS — R197 Diarrhea, unspecified: Secondary | ICD-10-CM | POA: Insufficient documentation

## 2022-07-21 DIAGNOSIS — Z7901 Long term (current) use of anticoagulants: Secondary | ICD-10-CM | POA: Insufficient documentation

## 2022-07-21 DIAGNOSIS — R5381 Other malaise: Secondary | ICD-10-CM | POA: Diagnosis not present

## 2022-07-21 DIAGNOSIS — R5383 Other fatigue: Secondary | ICD-10-CM

## 2022-07-21 DIAGNOSIS — R1110 Vomiting, unspecified: Secondary | ICD-10-CM | POA: Diagnosis not present

## 2022-07-21 DIAGNOSIS — I700 Atherosclerosis of aorta: Secondary | ICD-10-CM | POA: Diagnosis not present

## 2022-07-21 DIAGNOSIS — N390 Urinary tract infection, site not specified: Secondary | ICD-10-CM | POA: Insufficient documentation

## 2022-07-21 DIAGNOSIS — R1032 Left lower quadrant pain: Secondary | ICD-10-CM | POA: Diagnosis not present

## 2022-07-21 LAB — URINALYSIS, ROUTINE W REFLEX MICROSCOPIC
Bilirubin Urine: NEGATIVE
Glucose, UA: NEGATIVE mg/dL
Hgb urine dipstick: NEGATIVE
Ketones, ur: NEGATIVE mg/dL
Nitrite: POSITIVE — AB
Protein, ur: NEGATIVE mg/dL
Specific Gravity, Urine: 1.02 (ref 1.005–1.030)
WBC, UA: >50 WBC/hpf (ref 0–5)
pH: 5.5 (ref 5.0–8.0)

## 2022-07-21 LAB — COMPREHENSIVE METABOLIC PANEL
ALT: 14 U/L (ref 0–44)
AST: 23 U/L (ref 15–41)
Albumin: 3.8 g/dL (ref 3.5–5.0)
Alkaline Phosphatase: 87 U/L (ref 38–126)
Anion gap: 7 (ref 5–15)
BUN: 7 mg/dL — ABNORMAL LOW (ref 8–23)
CO2: 26 mmol/L (ref 22–32)
Calcium: 8.5 mg/dL — ABNORMAL LOW (ref 8.9–10.3)
Chloride: 105 mmol/L (ref 98–111)
Creatinine, Ser: 0.68 mg/dL (ref 0.44–1.00)
GFR, Estimated: >60 mL/min (ref >60)
Glucose, Bld: 95 mg/dL (ref 70–99)
Potassium: 3.3 mmol/L — ABNORMAL LOW (ref 3.5–5.1)
Sodium: 138 mmol/L (ref 135–145)
Total Bilirubin: 0.4 mg/dL (ref 0.3–1.2)
Total Protein: 6.1 g/dL — ABNORMAL LOW (ref 6.5–8.1)

## 2022-07-21 LAB — CBC
HCT: 39.1 % (ref 36.0–46.0)
Hemoglobin: 12.7 g/dL (ref 12.0–15.0)
MCH: 27.4 pg (ref 26.0–34.0)
MCHC: 32.5 g/dL (ref 30.0–36.0)
MCV: 84.4 fL (ref 80.0–100.0)
Platelets: 325 10*3/uL (ref 150–400)
RBC: 4.63 MIL/uL (ref 3.87–5.11)
RDW: 13.2 % (ref 11.5–15.5)
WBC: 8.6 10*3/uL (ref 4.0–10.5)
nRBC: 0 % (ref 0.0–0.2)

## 2022-07-21 LAB — LIPASE, BLOOD: Lipase: 85 U/L — ABNORMAL HIGH (ref 11–51)

## 2022-07-21 MED ORDER — LEVOFLOXACIN 500 MG PO TABS: 500.0000 mg | ORAL_TABLET | Freq: Every day | ORAL | 0 refills | Status: DC

## 2022-07-21 MED ORDER — IOHEXOL 300 MG/ML  SOLN
100.0000 mL | Freq: Once | INTRAMUSCULAR | Status: AC | PRN
  Administered 2022-07-21: 80 mL via INTRAVENOUS

## 2022-07-21 MED ORDER — LEVOFLOXACIN 500 MG PO TABS
500.0000 mg | ORAL_TABLET | Freq: Once | ORAL | Status: AC
  Administered 2022-07-21: 500 mg via ORAL
  Filled 2022-07-21: qty 1

## 2022-07-21 MED ORDER — LACTATED RINGERS IV BOLUS
1000.0000 mL | Freq: Once | INTRAVENOUS | Status: AC
  Administered 2022-07-21: 1000 mL via INTRAVENOUS

## 2022-07-21 MED ORDER — POTASSIUM CHLORIDE CRYS ER 20 MEQ PO TBCR
40.0000 meq | EXTENDED_RELEASE_TABLET | Freq: Once | ORAL | Status: AC
  Administered 2022-07-21: 40 meq via ORAL
  Filled 2022-07-21: qty 2

## 2022-07-21 NOTE — Discharge Instructions (Signed)
1.  At this time your urine suggest urinary tract infection.  You are being started on a medication called Levaquin.  You had your first dose in the emergency department today.  Start taking daily tomorrow. 2.  Try to stay well-hydrated and rest. 3.  Schedule a recheck with your doctor within the next 2 to 4 days. 4.  Return to the emergency department if you get any recurrence of nausea and vomiting, fever, back pain or abdominal pain or other concerning changes.

## 2022-07-21 NOTE — ED Provider Notes (Signed)
[facility] AT [facility] Provider Note   CSN: [id number] Arrival date & time: 07/21/22  4098     History  Chief Complaint  Patient presents with   Diarrhea    Kaylee Davis is a 82 y.o. female.  HPI Patient reports that she developed a vomiting and diarrheal illness last weekend.  On Friday she had eaten a [place] cheese sandwich from a [employer] and was sick pretty quickly afterwards.  She had a lot of vomiting and diarrhea for couple days but vomiting stopped within a couple of days.  Diarrhea had also stopped by day before yesterday.  She thought she was doing better but still feeling pretty weak generally.  No syncope.  Today she had recurrence of diarrhea and low-grade fever at home with increasing feeling of general weakness.  Patient reports she initially had a lot of abdominal pain and cramping which then abated and now has some residual mild left lower quadrant pain.    Home Medications Prior to Admission medications   Medication Sig Start Date End Date Taking? Authorizing Provider  levofloxacin (LEVAQUIN) 500 MG tablet Take 1 tablet (500 mg total) by mouth daily. 07/21/22  Yes Eduar Kumpf, Lebron Conners, MD  neomycin-polymyxin b-dexamethasone (MAXITROL) 3.5-10000-0.1 OINT Place 1 Application into the left eye 4 (four) times daily. 07/18/22  Yes [provider]  amLODipine (NORVASC) 5 MG tablet TAKE 1 TABLET(5 MG) BY MOUTH DAILY 03/14/22   Myrlene Broker, MD  benzonatate (TESSALON) 100 MG capsule Take 1 capsule (100 mg total) by mouth every 8 (eight) hours. 01/15/22   Melene Plan, DO  EPINEPHrine 0.3 mg/0.3 mL IJ SOAJ injection Inject 0.3 mg into the muscle as needed for anaphylaxis. 10/06/21   Edwin Dada P, DO  esomeprazole (NEXIUM) 20 MG capsule Take 20 mg by mouth daily at 12 noon.    [provider]  HYDROcodone-acetaminophen (NORCO/VICODIN) 5-325 MG tablet Take 1-2 tablets by mouth every 6 (six) hours as needed for severe  pain. 04/20/22   Cory Munch, PA-C  hydrOXYzine (ATARAX) 25 MG tablet Take 1 tablet (25 mg total) by mouth 2 (two) times daily as needed. 03/07/21   Myrlene Broker, MD  methocarbamol (ROBAXIN) 500 MG tablet Take 1 tablet (500 mg total) by mouth every 6 (six) hours as needed for muscle spasms. 04/20/22   Cory Munch, PA-C  montelukast (SINGULAIR) 10 MG tablet Take 1 tablet (10 mg total) by mouth at bedtime. 08/02/21   Corwin Levins, MD  Multiple Vitamin (MULTIVITAMIN WITH MINERALS) TABS tablet Take 1 tablet by mouth daily.    [provider]  ondansetron (ZOFRAN-ODT) 4 MG disintegrating tablet 4mg  ODT q4 hours prn nausea/vomit 01/15/22   Melene Plan, DO  PREMARIN vaginal cream Place 1 Applicatorful vaginally once a week.  11/18/17   [provider]  rivaroxaban (XARELTO) 10 MG TABS tablet Take 1 tablet (10 mg total) by mouth daily with breakfast for 20 days. 04/20/22 05/10/22  Cory Munch, PA-C  traMADol (ULTRAM) 50 MG tablet Take 1-2 tablets (50-100 mg total) by mouth every 6 (six) hours as needed for moderate pain. 04/20/22   Cory Munch, PA-C      Allergies    Nitrofuran derivatives, Other, Penicillins, and Sulfonamide derivatives    Review of Systems   Review of Systems  Physical Exam Updated Vital Signs BP (!) 148/67   Pulse 66   Temp 99 F (37.2 C) (Oral)   Resp 20  Ht 5\' 7"  (1.702 m)   Wt 71.2 kg   LMP 03/21/1983   SpO2 95%   BMI 24.59 kg/m  Physical Exam Constitutional:      Comments: Alert nontoxic.  Clear mental status.  No respiratory distress.  HENT:     Mouth/Throat:     Pharynx: Oropharynx is clear.  Eyes:     Extraocular Movements: Extraocular movements intact.     Conjunctiva/sclera: Conjunctivae normal.  Cardiovascular:     Rate and Rhythm: Normal rate and regular rhythm.  Pulmonary:     Effort: Pulmonary effort is normal.     Breath sounds: Normal breath sounds.  Abdominal:     Comments:  Abdomen soft without guarding.  Mild left lower quadrant tenderness to palpation.  Hyperactive bowel sounds.  Musculoskeletal:        General: No swelling. Normal range of motion.     Right lower leg: No edema.     Left lower leg: No edema.  Skin:    General: Skin is warm and dry.  Neurological:     General: No focal deficit present.     Mental Status: She is oriented to person, place, and time.     Motor: No weakness.     Coordination: Coordination normal.  Psychiatric:        Mood and Affect: Mood normal.     ED Results / Procedures / Treatments   Labs (all labs ordered are listed, but only abnormal results are displayed) Labs Reviewed  LIPASE, BLOOD - Abnormal; Notable for the following components:      Result Value   Lipase 85 (*)    All other components within normal limits  COMPREHENSIVE METABOLIC PANEL - Abnormal; Notable for the following components:   Potassium 3.3 (*)    BUN 7 (*)    Calcium 8.5 (*)    Total Protein 6.1 (*)    All other components within normal limits  URINALYSIS, ROUTINE W REFLEX MICROSCOPIC - Abnormal; Notable for the following components:   Nitrite POSITIVE (*)    Leukocytes,Ua MODERATE (*)    Bacteria, UA RARE (*)    All other components within normal limits  CBC    EKG None  Radiology CT ABDOMEN PELVIS W CONTRAST  Result Date: 07/21/2022 CLINICAL DATA:  Left lower quadrant abdominal pain. Nausea, vomiting and diarrhea x1 week. EXAM: CT ABDOMEN AND PELVIS WITH CONTRAST TECHNIQUE: Multidetector CT imaging of the abdomen and pelvis was performed using the standard protocol following bolus administration of intravenous contrast. RADIATION DOSE REDUCTION: This exam was performed according to the departmental dose-optimization program which includes automated exposure control, adjustment of the mA and/or kV according to patient size and/or use of iterative reconstruction technique. CONTRAST:  80mL OMNIPAQUE IOHEXOL 300 MG/ML  SOLN COMPARISON:  CTA  chest/abdomen/pelvis 02/16/2016. FINDINGS: Lower chest: No acute findings. Unchanged calcification in the right atrium. Hepatobiliary: No focal liver abnormality is seen. No gallstones, gallbladder wall thickening, or biliary dilatation. Pancreas: Unremarkable. No pancreatic ductal dilatation or surrounding inflammatory changes. Spleen: Normal. Adrenals/Urinary Tract: Adrenal glands are unremarkable. Patchy hypoenhancement of the renal cortex, which could represent chronic scarring versus pyelonephritis. No mass or hydronephrosis. 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 Stomach/Bowel: Normal stomach. Congenital malrotation of the bowel. No dilated loops of small bowel to indicate obstruction. Prior appendectomy. No bowel wall thickening or surrounding inflammation. Vascular/Lymphatic: Aortic atherosclerosis. No enlarged abdominal or pelvic lymph nodes. Reproductive: Status post hysterectomy. No adnexal  masses. Other: No abdominal wall hernia or abnormality. No abdominopelvic ascites. Musculoskeletal: No acute or significant osseous findings. IMPRESSION: 1. Patchy hypoenhancement of the renal cortex, which could represent chronic scarring versus pyelonephritis. Recommend correlation with urinalysis. 2. No other acute findings in the abdomen or pelvis. 3. Aortic Atherosclerosis (ICD10-I70.0). Electronically Signed   By: Orvan Falconer M.D.   On: 07/21/2022 11:22    Procedures Procedures    Medications Ordered in ED Medications  lactated ringers bolus 1,000 mL (0 mLs Intravenous Stopped 07/21/22 1301)  potassium chloride SA (KLOR-CON M) CR tablet 40 mEq (40 mEq Oral Given 07/21/22 1126)  iohexol (OMNIPAQUE) 300 MG/ML solution 100 mL (80 mLs Intravenous Contrast Given 07/21/22 1106)  levofloxacin (LEVAQUIN) tablet 500 mg (500 mg Oral Given 07/21/22 1450)    ED Course/ Medical Decision Making/ A&P Clinical Course as of 07/21/22 1451  Fri Jul 21, 2022   1447 CT ABDOMEN PELVIS W CONTRAST [MP]    Clinical Course User Index [MP] Arby Barrette, MD                             Medical Decision Making Amount and/or Complexity of Data Reviewed Labs: ordered. Radiology: ordered.  Risk Prescription drug management.   Patient presents as outlined.  She had acute onset of vomiting and diarrheal illness and thought she was getting improvement but today had rebound of weakness and malaise with low-grade fever at home.  At the outset she had significant abdominal pain.  Differential diagnosis includes food related gastroenteritis\infectious vomiting and diarrhea\diverticulitis\pyelonephritis\dehydration and electrolyte derangement.  Will proceed with diagnostic evaluation.  Will initiate fluid resuscitation with lactated Ringer's.  White count normal H&H normal.  GFR normal.  Urinalysis positive for nitrite and leuk esterase with greater than 50 WBCs rare bacteria in good specimen.  CT scan as follows:1. Patchy hypoenhancement of the renal cortex, which could represent chronic scarring versus pyelonephritis. Recommend correlation with urinalysis. 2. No other acute findings in the abdomen or pelvis. 3. Aortic Atherosclerosis (ICD10-I70.0).   Patient is clinically well in appearance.  She has been rehydrated.  Mental status clear.  No distress.  Given findings on CT scan of possible pyelonephritis combined with grossly positive urinalysis, will opt to treat with antibiotics.  Patient has not been symptomatic with dysuria or flank pain however with advanced age and malaise and recent diarrheal illness I suspect this is source of her symptoms.  As she is stable and clinically well will opt for outpatient treatment.  Patient has multiple allergies and reports significant penicillin allergy as well as Macrobid and sulfa.  Will opt for Levaquin.  Patient has been counseled on possibility of adverse reaction and tendinitis.  Plan will be for close outpatient  follow-up with PCP and strict return precautions reviewed with the patient and patient's husband at bedside.  All results and planning reviewed with family member at bedside.        Final Clinical Impression(s) / ED Diagnoses Final diagnoses:  Malaise and fatigue  Diarrhea of presumed infectious origin  Urinary tract infection without hematuria, site unspecified    Rx / DC Orders ED Discharge Orders          Ordered    levofloxacin (LEVAQUIN) 500 MG tablet  Daily        07/21/22 1442              Arby Barrette, MD 07/21/22 1451

## 2022-07-21 NOTE — ED Triage Notes (Signed)
Pt POV from home c/o N/V/D x1 week. Pt states she initially thought it was food poisoning over the weekend because it lasted fri-sun but then developed a low grade fever. Highest temp at home was 100.0 F. Last vomited yesterday, still having diarrhea.

## 2022-07-31 ENCOUNTER — Encounter: Payer: Self-pay | Admitting: Internal Medicine

## 2022-07-31 ENCOUNTER — Ambulatory Visit (INDEPENDENT_AMBULATORY_CARE_PROVIDER_SITE_OTHER): Payer: Medicare Other | Admitting: Internal Medicine

## 2022-07-31 VITALS — BP 120/86 | HR 67 | Temp 98.5°F | Ht 67.0 in | Wt 164.0 lb

## 2022-07-31 DIAGNOSIS — E876 Hypokalemia: Secondary | ICD-10-CM | POA: Diagnosis not present

## 2022-07-31 DIAGNOSIS — N1831 Chronic kidney disease, stage 3a: Secondary | ICD-10-CM | POA: Diagnosis not present

## 2022-07-31 DIAGNOSIS — N12 Tubulo-interstitial nephritis, not specified as acute or chronic: Secondary | ICD-10-CM | POA: Insufficient documentation

## 2022-07-31 DIAGNOSIS — I471 Supraventricular tachycardia, unspecified: Secondary | ICD-10-CM

## 2022-07-31 NOTE — Assessment & Plan Note (Signed)
Treated with levaquin and symptoms are resolved. No repeat U/A needed today.

## 2022-07-31 NOTE — Assessment & Plan Note (Signed)
No recent episodes at this time and is on amlodipine 5 mg daily. Continue.

## 2022-07-31 NOTE — Progress Notes (Signed)
   Subjective:   Patient ID: Kaylee Davis, female    DOB: 1940/08/10, 82 y.o.   MRN: [id number]  HPI The patient is an 82 YO female coming in for ER follow up (seen for recent GI illness and fatigue, found to have UTI potential for pyelonephritis, treated with antibiotics). Took levaquin and feels improved. No more diarrhea, N/V. Appetite good and energy improving slowly. No urinary symptoms.  PMH, [facility], social history reviewed and updated  Review of Systems  Constitutional:  Positive for fatigue.  HENT: Negative.    Eyes: Negative.   Respiratory:  Negative for cough, chest tightness and shortness of breath.   Cardiovascular:  Negative for chest pain, palpitations and leg swelling.  Gastrointestinal:  Negative for abdominal distention, abdominal pain, constipation, diarrhea, nausea and vomiting.  Musculoskeletal: Negative.   Skin: Negative.   Neurological: Negative.   Psychiatric/Behavioral: Negative.      Objective:  Physical Exam Constitutional:      Appearance: She is well-developed.  HENT:     Head: Normocephalic and atraumatic.  Cardiovascular:     Rate and Rhythm: Normal rate and regular rhythm.  Pulmonary:     Effort: Pulmonary effort is normal. No respiratory distress.     Breath sounds: Normal breath sounds. No wheezing or rales.  Abdominal:     General: Bowel sounds are normal. There is no distension.     Palpations: Abdomen is soft.     Tenderness: There is no abdominal tenderness. There is no rebound.  Musculoskeletal:     Cervical back: Normal range of motion.  Skin:    General: Skin is warm and dry.  Neurological:     Mental Status: She is alert and oriented to person, place, and time.     Coordination: Coordination normal.     Vitals:   07/31/22 1042  BP: 120/86  Pulse: 67  Temp: 98.5 F (36.9 C)  TempSrc: Oral  SpO2: 97%  Weight: 164 lb (74.4 kg)  Height: 5\' 7"  (1.702 m)    Assessment & Plan:  Visit time 20 minutes in face to face  communication with patient and coordination of care, additional 10 minutes spent in record review, coordination or care, ordering tests, communicating/referring to other healthcare professionals, documenting in medical records all on the same day of the visit for total time 30 minutes spent on the visit.

## 2022-07-31 NOTE — Assessment & Plan Note (Signed)
With recent K 3.3 in setting of GI bug. She was given replacement and is no longer having diarrhea or N/V and is eating normally. Repeat not required today.

## 2022-07-31 NOTE — Assessment & Plan Note (Signed)
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

## 2022-08-08 DIAGNOSIS — H40023 Open angle with borderline findings, high risk, bilateral: Secondary | ICD-10-CM | POA: Diagnosis not present

## 2022-08-09 DIAGNOSIS — R053 Chronic cough: Secondary | ICD-10-CM | POA: Diagnosis not present

## 2022-08-09 DIAGNOSIS — K219 Gastro-esophageal reflux disease without esophagitis: Secondary | ICD-10-CM | POA: Diagnosis not present

## 2022-08-10 DIAGNOSIS — L810 Postinflammatory hyperpigmentation: Secondary | ICD-10-CM | POA: Diagnosis not present

## 2022-09-04 DIAGNOSIS — N952 Postmenopausal atrophic vaginitis: Secondary | ICD-10-CM | POA: Diagnosis not present

## 2022-09-04 DIAGNOSIS — N3020 Other chronic cystitis without hematuria: Secondary | ICD-10-CM | POA: Diagnosis not present

## 2022-09-07 DIAGNOSIS — D485 Neoplasm of uncertain behavior of skin: Secondary | ICD-10-CM | POA: Diagnosis not present

## 2022-09-19 DIAGNOSIS — L309 Dermatitis, unspecified: Secondary | ICD-10-CM | POA: Diagnosis not present

## 2022-11-08 DIAGNOSIS — H0279 Other degenerative disorders of eyelid and periocular area: Secondary | ICD-10-CM | POA: Diagnosis not present

## 2022-11-08 DIAGNOSIS — H2513 Age-related nuclear cataract, bilateral: Secondary | ICD-10-CM | POA: Diagnosis not present

## 2022-11-08 DIAGNOSIS — H0015 Chalazion left lower eyelid: Secondary | ICD-10-CM | POA: Diagnosis not present

## 2022-12-15 DIAGNOSIS — H40023 Open angle with borderline findings, high risk, bilateral: Secondary | ICD-10-CM | POA: Diagnosis not present

## 2023-01-15 ENCOUNTER — Ambulatory Visit (INDEPENDENT_AMBULATORY_CARE_PROVIDER_SITE_OTHER): Payer: Medicare Other

## 2023-01-15 DIAGNOSIS — Z23 Encounter for immunization: Secondary | ICD-10-CM

## 2023-01-15 NOTE — Progress Notes (Signed)
Pt received HD flu w/o complications

## 2023-01-18 DIAGNOSIS — H0015 Chalazion left lower eyelid: Secondary | ICD-10-CM | POA: Diagnosis not present

## 2023-01-18 DIAGNOSIS — R238 Other skin changes: Secondary | ICD-10-CM | POA: Diagnosis not present

## 2023-01-31 DIAGNOSIS — H0279 Other degenerative disorders of eyelid and periocular area: Secondary | ICD-10-CM | POA: Diagnosis not present

## 2023-01-31 DIAGNOSIS — H019 Unspecified inflammation of eyelid: Secondary | ICD-10-CM | POA: Diagnosis not present

## 2023-02-13 ENCOUNTER — Ambulatory Visit (INDEPENDENT_AMBULATORY_CARE_PROVIDER_SITE_OTHER): Payer: Medicare Other | Admitting: *Deleted

## 2023-02-13 DIAGNOSIS — Z0000 Encounter for general adult medical examination without abnormal findings: Secondary | ICD-10-CM

## 2023-02-13 NOTE — Patient Instructions (Signed)
Kaylee Davis , Thank you for taking time to come for your Medicare Wellness Visit. I appreciate your ongoing commitment to your health goals. Please review the following plan we discussed and let me know if I can assist you in the future.   Screening recommendations/referrals: Colonoscopy: no longer required Mammogram: no longer required Recommended yearly ophthalmology/optometry visit for glaucoma screening and checkup Recommended yearly dental visit for hygiene and checkup  Vaccinations: Influenza vaccine: up to date Pneumococcal vaccine: up tod ate Tdap vaccine: up to date Shingles vaccine: up to date    Advanced directives: yes living will   Preventive Care 65 Years and Older, Female Preventive care refers to lifestyle choices and visits with your health care provider that can promote health and wellness. What does preventive care include? A yearly physical exam. This is also called an annual well check. Dental exams once or twice a year. Routine eye exams. Ask your health care provider how often you should have your eyes checked. Personal lifestyle choices, including: Daily care of your teeth and gums. Regular physical activity. Eating a healthy diet. Avoiding tobacco and drug use. Limiting alcohol use. Practicing safe sex. Taking low-dose aspirin every day. Taking vitamin and mineral supplements as recommended by your health care provider. What happens during an annual well check? The services and screenings done by your health care provider during your annual well check will depend on your age, overall health, lifestyle risk factors, and family history of disease. Counseling  Your health care provider may ask you questions about your: Alcohol use. Tobacco use. Drug use. Emotional well-being. Home and relationship well-being. Sexual activity. Eating habits. History of falls. Memory and ability to understand (cognition). Work and work Astronomer. Reproductive  health. Screening  You may have the following tests or measurements: Height, weight, and BMI. Blood pressure. Lipid and cholesterol levels. These may be checked every 5 years, or more frequently if you are over 65 years old. Skin check. Lung cancer screening. You may have this screening every year starting at age 59 if you have a 30-pack-year history of smoking and currently smoke or have quit within the past 15 years. Fecal occult blood test (FOBT) of the stool. You may have this test every year starting at age 44. Flexible sigmoidoscopy or colonoscopy. You may have a sigmoidoscopy every 5 years or a colonoscopy every 10 years starting at age 104. Hepatitis C blood test. Hepatitis B blood test. Sexually transmitted disease (STD) testing. Diabetes screening. This is done by checking your blood sugar (glucose) after you have not eaten for a while (fasting). You may have this done every 1-3 years. Bone density scan. This is done to screen for osteoporosis. You may have this done starting at age 43. Mammogram. This may be done every 1-2 years. Talk to your health care provider about how often you should have regular mammograms. Talk with your health care provider about your test results, treatment options, and if necessary, the need for more tests. Vaccines  Your health care provider may recommend certain vaccines, such as: Influenza vaccine. This is recommended every year. Tetanus, diphtheria, and acellular pertussis (Tdap, Td) vaccine. You may need a Td booster every 10 years. Zoster vaccine. You may need this after age 61. Pneumococcal 13-valent conjugate (PCV13) vaccine. One dose is recommended after age 54. Pneumococcal polysaccharide (PPSV23) vaccine. One dose is recommended after age 83. Talk to your health care provider about which screenings and vaccines you need and how often you need them. This  information is not intended to replace advice given to you by your health care provider.  Make sure you discuss any questions you have with your health care provider. Document Released: 04/02/2015 Document Revised: 11/24/2015 Document Reviewed: 01/05/2015 Elsevier Interactive Patient Education  2017 [employer].  Fall Prevention in the Home Falls can cause injuries. They can happen to people of all ages. There are many things you can do to make your home safe and to help prevent falls. What can I do on the outside of my home? Regularly fix the edges of walkways and driveways and fix any cracks. Remove anything that might make you trip as you walk through a door, such as a raised step or threshold. Trim any bushes or trees on the path to your home. Use bright outdoor lighting. Clear any walking paths of anything that might make someone trip, such as rocks or tools. Regularly check to see if handrails are loose or broken. Make sure that both sides of any steps have handrails. Any raised decks and porches should have guardrails on the edges. Have any leaves, snow, or ice cleared regularly. Use sand or salt on walking paths during winter. Clean up any spills in your garage right away. This includes oil or grease spills. What can I do in the bathroom? Use night lights. Install grab bars by the toilet and in the tub and shower. Do not use towel bars as grab bars. Use non-skid mats or decals in the tub or shower. If you need to sit down in the shower, use a plastic, non-slip stool. Keep the floor dry. Clean up any water that spills on the floor as soon as it happens. Remove soap buildup in the tub or shower regularly. Attach bath mats securely with double-sided non-slip rug tape. Do not have throw rugs and other things on the floor that can make you trip. What can I do in the bedroom? Use night lights. Make sure that you have a light by your bed that is easy to reach. Do not use any sheets or blankets that are too big for your bed. They should not hang down onto the floor. Have a  firm chair that has side arms. You can use this for support while you get dressed. Do not have throw rugs and other things on the floor that can make you trip. What can I do in the kitchen? Clean up any spills right away. Avoid walking on wet floors. Keep items that you use a lot in easy-to-reach places. If you need to reach something above you, use a strong step stool that has a grab bar. Keep electrical cords out of the way. Do not use floor polish or wax that makes floors slippery. If you must use wax, use non-skid floor wax. Do not have throw rugs and other things on the floor that can make you trip. What can I do with my stairs? Do not leave any items on the stairs. Make sure that there are handrails on both sides of the stairs and use them. Fix handrails that are broken or loose. Make sure that handrails are as long as the stairways. Check any carpeting to make sure that it is firmly attached to the stairs. Fix any carpet that is loose or worn. Avoid having throw rugs at the top or bottom of the stairs. If you do have throw rugs, attach them to the floor with carpet tape. Make sure that you have a light switch at the top  of the stairs and the bottom of the stairs. If you do not have them, ask someone to add them for you. What else can I do to help prevent falls? Wear shoes that: Do not have high heels. Have rubber bottoms. Are comfortable and fit you well. Are closed at the toe. Do not wear sandals. If you use a stepladder: Make sure that it is fully opened. Do not climb a closed stepladder. Make sure that both sides of the stepladder are locked into place. Ask someone to hold it for you, if possible. Clearly mark and make sure that you can see: Any grab bars or handrails. First and last steps. Where the edge of each step is. Use tools that help you move around (mobility aids) if they are needed. These include: Canes. Walkers. Scooters. Crutches. Turn on the lights when you  go into a dark area. Replace any light bulbs as soon as they burn out. Set up your furniture so you have a clear path. Avoid moving your furniture around. If any of your floors are uneven, fix them. If there are any pets around you, be aware of where they are. Review your medicines with your doctor. Some medicines can make you feel dizzy. This can increase your chance of falling. Ask your doctor what other things that you can do to help prevent falls. This information is not intended to replace advice given to you by your health care provider. Make sure you discuss any questions you have with your health care provider. Document Released: 12/31/2008 Document Revised: 08/12/2015 Document Reviewed: 04/10/2014 Elsevier Interactive Patient Education  2017 [employer].

## 2023-02-13 NOTE — Progress Notes (Signed)
Subjective:   Kaylee Davis is a 82 y.o. female who presents for Medicare Annual (Subsequent) preventive examination.  Visit Complete: Virtual I connected with  [name] on 02/13/23 by a audio enabled telemedicine application and verified that I am speaking with the correct person using two identifiers.  Patient Location: Home  Provider Location: Home Office  I discussed the limitations of evaluation and management by telemedicine. The patient expressed understanding and agreed to proceed.  Vital Signs: Because this visit was a virtual/telehealth visit, some criteria may be missing or patient reported. Any vitals not documented were not able to be obtained and vitals that have been documented are patient reported.   Cardiac Risk Factors include: advanced age (>74men, >48 women);hypertension     Objective:    There were no vitals filed for this visit. There is no height or weight on file to calculate BMI.     02/13/2023    9:02 AM 04/19/2022    8:03 AM 04/07/2022    2:07 PM 01/15/2022    8:06 AM 10/06/2021   10:25 AM 03/06/2018   12:00 PM 03/01/2018    2:16 PM  Advanced Directives  Does Patient Have a Medical Advance Directive? Yes Yes Yes Yes Yes Yes Yes  Type of Advance Directive Living will Healthcare Power of [place];Living will Healthcare Power of [place];Living will Living will  Healthcare Power of [place];Living will Healthcare Power of [place];Living will  Does patient want to make changes to medical advance directive?  No - Patient declined   No - Patient declined No - Patient declined No - Patient declined  Copy of Healthcare Power of Attorney in Chart?  No - copy requested No - copy requested   No - copy requested No - copy requested    Current Medications (verified) Outpatient Encounter Medications as of 02/13/2023  Medication Sig   amLODipine (NORVASC) 5 MG tablet TAKE 1 TABLET(5 MG) BY MOUTH DAILY   EPINEPHrine 0.3 mg/0.3 mL IJ SOAJ injection  Inject 0.3 mg into the muscle as needed for anaphylaxis.   esomeprazole (NEXIUM) 20 MG capsule Take 20 mg by mouth daily at 12 noon.   benzonatate (TESSALON) 100 MG capsule Take 1 capsule (100 mg total) by mouth every 8 (eight) hours.   HYDROcodone-acetaminophen (NORCO/VICODIN) 5-325 MG tablet Take 1-2 tablets by mouth every 6 (six) hours as needed for severe pain.   hydrOXYzine (ATARAX) 25 MG tablet Take 1 tablet (25 mg total) by mouth 2 (two) times daily as needed.   methocarbamol (ROBAXIN) 500 MG tablet Take 1 tablet (500 mg total) by mouth every 6 (six) hours as needed for muscle spasms.   montelukast (SINGULAIR) 10 MG tablet Take 1 tablet (10 mg total) by mouth at bedtime.   Multiple Vitamin (MULTIVITAMIN WITH MINERALS) TABS tablet Take 1 tablet by mouth daily.   neomycin-polymyxin b-dexamethasone (MAXITROL) 3.5-10000-0.1 OINT Place 1 Application into the left eye 4 (four) times daily.   ondansetron (ZOFRAN-ODT) 4 MG disintegrating tablet 4mg  ODT q4 hours prn nausea/vomit   PREMARIN vaginal cream Place 1 Applicatorful vaginally once a week.    rivaroxaban (XARELTO) 10 MG TABS tablet Take 1 tablet (10 mg total) by mouth daily with breakfast for 20 days.   traMADol (ULTRAM) 50 MG tablet Take 1-2 tablets (50-100 mg total) by mouth every 6 (six) hours as needed for moderate pain.   No facility-administered encounter medications on file as of 02/13/2023.    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 Triggered by elevated heart rate   Hypertension    Imbalance    Migraine headache    much reduced frequency   Tachycardia    Episodic   Tingling    left foot    TMJ (temporomandibular joint disorder)    PAST HX GRINDING TEETH --BUT NO LONGER A PROBLEM   Wears glasses    Past Surgical History:  Procedure Laterality Date   ABDOMINAL HYSTERECTOMY  1985   TAH/BSO--for ?fibroids   APPENDECTOMY     ARTHROPLASTY     left Knee    cyst removed      from mouth   KNEE ARTHROSCOPY Left 10/2005   LAPAROSCOPIC APPENDECTOMY  02/2000   REPLACEMENT TOTAL KNEE Right    TONSILLECTOMY     TOTAL HIP ARTHROPLASTY Right 03/06/2018   Procedure: TOTAL HIP ARTHROPLASTY ANTERIOR APPROACH;  Surgeon: Ollen Gross, MD;  Location: [facility];  Service: Orthopedics;  Laterality: Right;    TOTAL HIP ARTHROPLASTY Left 04/19/2022   Procedure: TOTAL HIP ARTHROPLASTY ANTERIOR APPROACH;  Surgeon: Ollen Gross, MD;  Location: [facility];  Service: Orthopedics;  Laterality: Left;   TOTAL KNEE ARTHROPLASTY Left 07/29/2012   Procedure: LEFT TOTAL KNEE ARTHROPLASTY;  Surgeon: Loanne Drilling, MD;  Location: [facility];  Service: Orthopedics;  Laterality: Left;   TOTAL KNEE ARTHROPLASTY Right 05/28/2015   Procedure: RIGHT TOTAL KNEE ARTHROPLASTY;  Surgeon: Ollen Gross, MD;  Location: [facility];  Service: Orthopedics;  Laterality: Right;   WISDOM TOOTH EXTRACTION  2008   Family History  Problem Relation Age of Onset   Cancer Neg Hx        Breast/Colon   Diabetes Neg Hx    Heart disease Neg Hx        CAD/MI   Social History   Socioeconomic History   Marital status: Married    Spouse name: Not on file   Number of children: 2   Years of education: 12   Highest education level: Not on file  Occupational History    Comment: retired  Tobacco Use   Smoking status: Never   Smokeless tobacco: Never  Vaping Use   Vaping status: Never Used  Substance and Sexual Activity   Alcohol use: No     Alcohol/week: 0.0 standard drinks of alcohol    Comment: no    Drug use: No   Sexual activity: Not Currently    Partners: Male    Birth control/protection: Surgical    Comment: TAH/BSO  Other Topics Concern   Not on file  Social History Narrative   [employer], married 1958 2 sons 1961 and 1963, 1 grandson 17. End of life issues - materials provided for contemplation. Still in the contemplative.   One grand son; 1    Social Determinants of Health   Financial Resource Strain: Low Risk  (02/13/2023)   Overall Financial Resource Strain (CARDIA)    Difficulty of  Paying Living Expenses: Not hard at all  Food Insecurity: No Food Insecurity (02/13/2023)   Hunger Vital Sign    Worried About Running Out of Food in the Last Year: Never true    Ran Out of Food in the Last Year: Never true  Transportation Needs: No Transportation Needs (02/13/2023)   PRAPARE - Administrator, Civil Service (Medical): No    Lack of Transportation (Non-Medical): No  Physical Activity: Inactive (02/13/2023)   Exercise Vital Sign    Days of Exercise per Week: 0 days    Minutes of Exercise per Session: 0 min  Stress: No Stress Concern Present (02/13/2023)   [employer] of Occupational Health - Occupational Stress Questionnaire    Feeling of Stress : Not at all  Social Connections: Moderately Isolated (02/13/2023)   Social Connection and Isolation Panel [NHANES]    Frequency of Communication with Friends and Family: More than three times a week    Frequency of Social Gatherings with Friends and Family: Once a week    Attends Religious Services: Never    Database administrator or Organizations: No    Attends Engineer, structural: Never    Marital Status: Married    Tobacco Counseling Counseling given: Not Answered   Clinical Intake:  Pre-visit preparation completed: Yes  Pain : No/denies pain     Diabetes: No  How often do you need to have someone help  you when you read instructions, pamphlets, or other written materials from your doctor or pharmacy?: 1 - Never  Interpreter Needed?: No  Information entered by :: Remi Haggard LPN   Activities of Daily Living    02/13/2023    9:04 AM 04/19/2022   12:00 PM  In your present state of health, do you have any difficulty performing the following activities:  Hearing? 0 0  Vision? 0 0  Difficulty concentrating or making decisions? 0 0  Walking or climbing stairs? 0 0  Dressing or bathing? 0 0  Doing errands, shopping? 0 0  Preparing Food and eating ? N   Using the Toilet? N   In the past six months, have you accidently leaked urine? N   Do you have problems with loss of bowel control? N   Managing your Medications? N   Managing your Finances? N     Patient Care Team: Myrlene Broker, MD as PCP - General (Internal Medicine) Kathleene Hazel, MD as PCP - Cardiology (Cardiology) Gladys Damme (Gynecology) Ollen Gross, MD (Orthopedic Surgery)  Indicate any recent Medical Services you may have received from other than [facility] providers in the past year (date may be approximate).     Assessment:   This is a routine wellness examination for Kaylee Davis.  Hearing/Vision screen Hearing Screening - Comments:: No trouble hearing Vision Screening - Comments:: Up to date [facility]   Goals Addressed             This Visit's Progress    Patient Stated       Continue current lifestyle       Depression Screen    02/13/2023    9:07 AM 07/31/2022   10:45 AM 03/10/2022   10:07 AM 01/27/2022   11:10 AM 05/10/2021    9:22 AM 03/07/2021    3:48 PM 01/15/2020    1:13 PM  PHQ 2/9 Scores  PHQ - 2 Score 0 0 0 0 5 0 0  PHQ- 9 Score 0 0 0 0 12  Fall Risk    02/13/2023    8:56 AM 07/31/2022   10:44 AM 03/10/2022   10:04 AM 01/27/2022   11:09 AM 05/10/2021    9:22 AM  Fall Risk   Falls in the past year? 0 0 0 0 0  Number falls in past yr: 0 0 0 0 0  Injury with  Fall? 0 0 0 0 1  Risk for fall due to :   No Fall Risks    Follow up Falls evaluation completed;Education provided;Falls prevention discussed Falls evaluation completed Falls prevention discussed Falls evaluation completed     MEDICARE RISK AT HOME: Medicare Risk at Home Any stairs in or around the home?: Yes (stair lift) If so, are there any without handrails?: No Home free of loose throw rugs in walkways, pet beds, electrical cords, etc?: Yes Adequate lighting in your home to reduce risk of falls?: Yes Life alert?: No Use of a cane, walker or w/c?: Yes Grab bars in the bathroom?: No Shower chair or bench in shower?: Yes Elevated toilet seat or a handicapped toilet?: Yes  TIMED UP AND GO:  Was the test performed?  No    Cognitive Function:        02/13/2023    9:07 AM 03/10/2022   10:07 AM  6CIT Screen  What Year? 0 points 0 points  What month? 0 points 0 points  What time? 0 points 0 points  Count back from 20 0 points 0 points  Months in reverse 0 points 0 points  Repeat phrase 0 points 0 points  Total Score 0 points 0 points    Immunizations Immunization History  Administered Date(s) Administered   Fluad Quad(high Dose 65+) 01/13/2019, 01/15/2020, 02/03/2021, 12/06/2021   Fluad Trivalent(High Dose 65+) 01/15/2023   Influenza Split 01/02/2011, 01/04/2012   Influenza Whole 12/11/2008, 12/13/2009   Influenza, High Dose Seasonal PF 01/15/2015, 12/14/2015, 01/08/2017, 01/10/2018   Influenza,inj,Quad PF,6+ Mos 12/17/2012, 01/12/2014   PFIZER(Purple Top)SARS-COV-2 Vaccination 05/18/2019, 06/17/2019, 01/09/2020, 07/01/2020, 01/05/2021   Pfizer Covid-19 Vaccine Bivalent Booster 62yrs & up 07/10/2021   Pneumococcal Conjugate-13 02/05/2013   Pneumococcal Polysaccharide-23 12/06/2006, 01/15/2020   RSV,unspecified 12/05/2021   Respiratory Syncytial Virus Vaccine,Recomb Aduvanted(Arexvy) 12/05/2021   Td 12/11/2008   Tdap 01/13/2019   Unspecified SARS-COV-2 Vaccination  12/06/2021   Zoster Recombinant(Shingrix) 08/04/2016, 11/30/2016   Zoster, Live 03/21/2005    TDAP status: Up to date  Flu Vaccine status: Up to date  Pneumococcal vaccine status: Up to date  Covid-19 vaccine status: Completed vaccines  Qualifies for Shingles Vaccine? No   Zostavax completed Yes   Shingrix Completed?: Yes  Screening Tests Health Maintenance  Topic Date Due   COVID-19 Vaccine (8 - 2023-24 season) 03/01/2023 (Originally 11/19/2022)   DEXA SCAN  02/13/2024 (Originally 06/02/2003)   Medicare Annual Wellness (AWV)  02/13/2024   DTaP/Tdap/Td (3 - Td or Tdap) 01/12/2029   Pneumonia Vaccine 5+ Years old  Completed   INFLUENZA VACCINE  Completed   Zoster Vaccines- Shingrix  Completed   HPV VACCINES  Aged Out    Health Maintenance  There are no preventive care reminders to display for this patient.   Colorectal cancer screening: No longer required.   Mammogram status: No longer required due to age.  Bone Density  declined  Lung Cancer Screening: (Low Dose CT Chest recommended if Age 75-80 years, 20 pack-year currently smoking OR have quit w/in 15years.) does not qualify.   Lung Cancer Screening Referral:   Additional Screening:  Hepatitis C  Screening  never done  Vision Screening: Recommended annual ophthalmology exams for early detection of glaucoma and other disorders of the eye. Is the patient up to date with their annual eye exam?  Yes  Who is the provider or what is the name of the office in which the patient attends annual eye exams? [facility] If pt is not established with a provider, would they like to be referred to a provider to establish care? No .   Dental Screening: Recommended annual dental exams for proper oral hygiene    Community Resource Referral / Chronic Care Management: CRR required this visit?  No   CCM required this visit?  No     Plan:     I have personally reviewed and noted the following in the patient's chart:   Medical  and social history Use of alcohol, tobacco or illicit drugs  Current medications and supplements including opioid prescriptions. Patient is not currently taking opioid prescriptions. Functional ability and status Nutritional status Physical activity Advanced directives List of other physicians Hospitalizations, surgeries, and ER visits in previous 12 months Vitals Screenings to include cognitive, depression, and falls Referrals and appointments  In addition, I have reviewed and discussed with patient certain preventive protocols, quality metrics, and best practice recommendations. A written personalized care plan for preventive services as well as general preventive health recommendations were provided to patient.     Remi Haggard, LPN   19/14/7829   After Visit Summary: (MyChart) Due to this being a telephonic visit, the after visit summary with patients personalized plan was offered to patient via MyChart   Nurse Notes:

## 2023-03-07 ENCOUNTER — Other Ambulatory Visit: Payer: Self-pay | Admitting: Internal Medicine

## 2023-03-19 DIAGNOSIS — K080 Exfoliation of teeth due to systemic causes: Secondary | ICD-10-CM | POA: Diagnosis not present

## 2023-03-23 ENCOUNTER — Ambulatory Visit: Payer: Medicare Other | Admitting: Internal Medicine

## 2023-03-27 ENCOUNTER — Ambulatory Visit (INDEPENDENT_AMBULATORY_CARE_PROVIDER_SITE_OTHER): Payer: Medicare Other | Admitting: Internal Medicine

## 2023-03-27 ENCOUNTER — Encounter: Payer: Self-pay | Admitting: Internal Medicine

## 2023-03-27 VITALS — BP 138/80 | HR 77 | Temp 97.9°F | Ht 67.0 in | Wt 163.0 lb

## 2023-03-27 DIAGNOSIS — R300 Dysuria: Secondary | ICD-10-CM | POA: Diagnosis not present

## 2023-03-27 DIAGNOSIS — E559 Vitamin D deficiency, unspecified: Secondary | ICD-10-CM

## 2023-03-27 DIAGNOSIS — N1831 Chronic kidney disease, stage 3a: Secondary | ICD-10-CM

## 2023-03-27 DIAGNOSIS — E785 Hyperlipidemia, unspecified: Secondary | ICD-10-CM

## 2023-03-27 DIAGNOSIS — Z0001 Encounter for general adult medical examination with abnormal findings: Secondary | ICD-10-CM | POA: Diagnosis not present

## 2023-03-27 DIAGNOSIS — I10 Essential (primary) hypertension: Secondary | ICD-10-CM

## 2023-03-27 DIAGNOSIS — E538 Deficiency of other specified B group vitamins: Secondary | ICD-10-CM

## 2023-03-27 DIAGNOSIS — Z0000 Encounter for general adult medical examination without abnormal findings: Secondary | ICD-10-CM

## 2023-03-27 DIAGNOSIS — M79672 Pain in left foot: Secondary | ICD-10-CM | POA: Diagnosis not present

## 2023-03-27 DIAGNOSIS — R739 Hyperglycemia, unspecified: Secondary | ICD-10-CM | POA: Diagnosis not present

## 2023-03-27 DIAGNOSIS — F4323 Adjustment disorder with mixed anxiety and depressed mood: Secondary | ICD-10-CM

## 2023-03-27 LAB — POCT URINALYSIS DIPSTICK
Bilirubin, UA: NEGATIVE
Blood, UA: NEGATIVE
Glucose, UA: NEGATIVE
Ketones, UA: NEGATIVE
Nitrite, UA: POSITIVE
Protein, UA: NEGATIVE
Spec Grav, UA: 1.015 (ref 1.010–1.025)
Urobilinogen, UA: NEGATIVE U/dL — AB
pH, UA: 6 (ref 5.0–8.0)

## 2023-03-27 MED ORDER — CIPROFLOXACIN HCL 250 MG PO TABS: 250.0000 mg | ORAL_TABLET | Freq: Two times a day (BID) | ORAL | 0 refills | Status: AC

## 2023-03-27 MED ORDER — AMLODIPINE BESYLATE 5 MG PO TABS: 5.0000 mg | ORAL_TABLET | Freq: Every day | ORAL | 3 refills | Status: DC

## 2023-03-27 NOTE — Patient Instructions (Addendum)
 We will check the x-ray of the foot.  We have sent in cipro for the urine infection to take 1 pill daily for 3 days.

## 2023-03-27 NOTE — Progress Notes (Signed)
   Subjective:   Patient ID: Kaylee Davis, female    DOB: 1940/03/29, 83 y.o.   MRN: [id number]  HPI The patient is here for physical. Has acute concerns of recent UTI and unsure if she is resolved or not. Still having some symptoms. Also having left foot pain.   PMH, [facility], social history reviewed and updated  Review of Systems  Constitutional: Negative.   HENT: Negative.    Eyes: Negative.   Respiratory:  Negative for cough, chest tightness and shortness of breath.   Cardiovascular:  Negative for chest pain, palpitations and leg swelling.  Gastrointestinal:  Negative for abdominal distention, abdominal pain, constipation, diarrhea, nausea and vomiting.  Musculoskeletal:  Positive for arthralgias and myalgias.  Skin: Negative.   Neurological: Negative.   Psychiatric/Behavioral: Negative.      Objective:  Physical Exam Constitutional:      Appearance: She is well-developed.  HENT:     Head: Normocephalic and atraumatic.  Cardiovascular:     Rate and Rhythm: Normal rate and regular rhythm.  Pulmonary:     Effort: Pulmonary effort is normal. No respiratory distress.     Breath sounds: Normal breath sounds. No wheezing or rales.  Abdominal:     General: Bowel sounds are normal. There is no distension.     Palpations: Abdomen is soft.     Tenderness: There is no abdominal tenderness. There is no rebound.  Musculoskeletal:        General: Tenderness present.     Cervical back: Normal range of motion.  Skin:    General: Skin is warm and dry.  Neurological:     Mental Status: She is alert and oriented to person, place, and time.     Coordination: Coordination normal.     Vitals:   03/27/23 1317  BP: 138/80  Pulse: 77  Temp: 97.9 F (36.6 C)  TempSrc: Oral  SpO2: 98%  Weight: 163 lb (73.9 kg)  Height: 5' 7 (1.702 m)    Assessment & Plan:

## 2023-03-28 ENCOUNTER — Ambulatory Visit (INDEPENDENT_AMBULATORY_CARE_PROVIDER_SITE_OTHER): Payer: Medicare Other

## 2023-03-28 ENCOUNTER — Other Ambulatory Visit (INDEPENDENT_AMBULATORY_CARE_PROVIDER_SITE_OTHER): Payer: Medicare Other

## 2023-03-28 DIAGNOSIS — I10 Essential (primary) hypertension: Secondary | ICD-10-CM | POA: Diagnosis not present

## 2023-03-28 DIAGNOSIS — M79672 Pain in left foot: Secondary | ICD-10-CM

## 2023-03-28 DIAGNOSIS — E538 Deficiency of other specified B group vitamins: Secondary | ICD-10-CM | POA: Diagnosis not present

## 2023-03-28 DIAGNOSIS — E785 Hyperlipidemia, unspecified: Secondary | ICD-10-CM

## 2023-03-28 DIAGNOSIS — M19072 Primary osteoarthritis, left ankle and foot: Secondary | ICD-10-CM | POA: Diagnosis not present

## 2023-03-28 DIAGNOSIS — E559 Vitamin D deficiency, unspecified: Secondary | ICD-10-CM | POA: Diagnosis not present

## 2023-03-28 DIAGNOSIS — R739 Hyperglycemia, unspecified: Secondary | ICD-10-CM | POA: Diagnosis not present

## 2023-03-28 DIAGNOSIS — M2012 Hallux valgus (acquired), left foot: Secondary | ICD-10-CM | POA: Diagnosis not present

## 2023-03-28 LAB — LIPID PANEL
Cholesterol: 217 mg/dL — ABNORMAL HIGH (ref 0–200)
HDL: 62.6 mg/dL (ref >39.00)
LDL Cholesterol: 135 mg/dL — ABNORMAL HIGH (ref 0–99)
NonHDL: 154.28
Total CHOL/HDL Ratio: 3
Triglycerides: 96 mg/dL (ref 0.0–149.0)
VLDL: 19.2 mg/dL (ref 0.0–40.0)

## 2023-03-28 LAB — CBC
HCT: 40.3 % (ref 36.0–46.0)
Hemoglobin: 13.3 g/dL (ref 12.0–15.0)
MCHC: 33 g/dL (ref 30.0–36.0)
MCV: 86.7 fL (ref 78.0–100.0)
Platelets: 339 10*3/uL (ref 150.0–400.0)
RBC: 4.65 Mil/uL (ref 3.87–5.11)
RDW: 13.9 % (ref 11.5–15.5)
WBC: 6.2 10*3/uL (ref 4.0–10.5)

## 2023-03-28 LAB — COMPREHENSIVE METABOLIC PANEL
ALT: 9 U/L (ref 0–35)
AST: 21 U/L (ref 0–37)
Albumin: 4.2 g/dL (ref 3.5–5.2)
Alkaline Phosphatase: 87 U/L (ref 39–117)
BUN: 11 mg/dL (ref 6–23)
CO2: 29 meq/L (ref 19–32)
Calcium: 9.2 mg/dL (ref 8.4–10.5)
Chloride: 104 meq/L (ref 96–112)
Creatinine, Ser: 0.82 mg/dL (ref 0.40–1.20)
GFR: 65.5 mL/min (ref >60.00)
Glucose, Bld: 90 mg/dL (ref 70–99)
Potassium: 4 meq/L (ref 3.5–5.1)
Sodium: 141 meq/L (ref 135–145)
Total Bilirubin: 0.5 mg/dL (ref 0.2–1.2)
Total Protein: 6.7 g/dL (ref 6.0–8.3)

## 2023-03-28 LAB — VITAMIN B12: Vitamin B-12: 190 pg/mL — ABNORMAL LOW (ref 211–911)

## 2023-03-28 LAB — TSH: TSH: 4.2 u[IU]/mL (ref 0.35–5.50)

## 2023-03-28 LAB — VITAMIN D 25 HYDROXY (VIT D DEFICIENCY, FRACTURES): VITD: 14.63 ng/mL — ABNORMAL LOW (ref 30.00–100.00)

## 2023-03-28 LAB — HEMOGLOBIN A1C: Hgb A1c MFr Bld: 6 % (ref 4.6–6.5)

## 2023-03-29 ENCOUNTER — Other Ambulatory Visit: Payer: Self-pay | Admitting: Internal Medicine

## 2023-03-29 DIAGNOSIS — H2512 Age-related nuclear cataract, left eye: Secondary | ICD-10-CM | POA: Diagnosis not present

## 2023-03-29 DIAGNOSIS — H25043 Posterior subcapsular polar age-related cataract, bilateral: Secondary | ICD-10-CM | POA: Diagnosis not present

## 2023-03-29 DIAGNOSIS — H2513 Age-related nuclear cataract, bilateral: Secondary | ICD-10-CM | POA: Diagnosis not present

## 2023-03-29 DIAGNOSIS — H18413 Arcus senilis, bilateral: Secondary | ICD-10-CM | POA: Diagnosis not present

## 2023-03-29 DIAGNOSIS — H25013 Cortical age-related cataract, bilateral: Secondary | ICD-10-CM | POA: Diagnosis not present

## 2023-03-29 MED ORDER — VITAMIN D (ERGOCALCIFEROL) 1.25 MG (50000 UNIT) PO CAPS: 50000.0000 [IU] | ORAL_CAPSULE | ORAL | 0 refills | Status: DC

## 2023-03-30 ENCOUNTER — Encounter: Payer: Self-pay | Admitting: Internal Medicine

## 2023-03-30 DIAGNOSIS — M79672 Pain in left foot: Secondary | ICD-10-CM | POA: Insufficient documentation

## 2023-03-30 NOTE — Assessment & Plan Note (Addendum)
 Checking U/A for resolution of recent infection as she is still having mild discomfort. Treated with 3 days cipro given allergies.

## 2023-03-30 NOTE — Assessment & Plan Note (Signed)
Checking Hga1c. Adjust as needed.

## 2023-03-30 NOTE — Assessment & Plan Note (Signed)
 New pain and recent hip replacement. Checking x-ray for any stress fracture and treat as appropriate.

## 2023-03-30 NOTE — Assessment & Plan Note (Signed)
 Checking CMP and BP at goal on amlodipine 5 mg daily.

## 2023-03-30 NOTE — Assessment & Plan Note (Signed)
 Stable off meds. ?

## 2023-03-30 NOTE — Assessment & Plan Note (Signed)
Flu shot up to date. Pneumonia complete. Shingrix complete. Tetanus up to date. Colonoscopy aged out. Mammogram aged out, pap smear aged out and dexa complete. Counseled about sun safety and mole surveillance. Counseled about the dangers of distracted driving. Given 10 year screening recommendations.

## 2023-03-30 NOTE — Assessment & Plan Note (Signed)
 Checking lipid panel and adjust as needed. Diet controlled currently.

## 2023-03-30 NOTE — Assessment & Plan Note (Signed)
Checking B12 and adjust as needed. 

## 2023-03-30 NOTE — Assessment & Plan Note (Signed)
 Checking vitamin D and adjust as needed.

## 2023-03-30 NOTE — Assessment & Plan Note (Signed)
 Checking CMP and adjust as needed.

## 2023-04-02 ENCOUNTER — Ambulatory Visit (INDEPENDENT_AMBULATORY_CARE_PROVIDER_SITE_OTHER): Payer: Medicare Other

## 2023-04-02 DIAGNOSIS — E538 Deficiency of other specified B group vitamins: Secondary | ICD-10-CM | POA: Diagnosis not present

## 2023-04-02 MED ORDER — CYANOCOBALAMIN 1000 MCG/ML IJ SOLN
1000.0000 ug | Freq: Once | INTRAMUSCULAR | Status: AC
  Administered 2023-04-02: 1000 ug via INTRAMUSCULAR

## 2023-04-02 NOTE — Progress Notes (Signed)
After obtaining consent, and per orders of Dr. Sharlet Salina, injection of B12 given by Marrian Salvage. Patient instructed to report any adverse reaction to me immediately.

## 2023-04-03 ENCOUNTER — Ambulatory Visit: Payer: Self-pay | Admitting: Internal Medicine

## 2023-04-03 DIAGNOSIS — R300 Dysuria: Secondary | ICD-10-CM

## 2023-04-03 NOTE — Telephone Encounter (Signed)
 I have entered orders for urine. Given her allergies we should reassess if infection is cleared and if not what resistances may be present.

## 2023-04-03 NOTE — Telephone Encounter (Signed)
  Chief Complaint: UTI Symptoms: burning, pressure, urinary frequency Frequency: Not improved since antibiotic completion 03/30/23 Pertinent Negatives: Patient denies blood in urine, fever Disposition: [] ED /[] Urgent Care (no appt availability in office) / [x] Appointment(In office/virtual)/ []  [place] [facility]/ [] Home Care/ [x] Refused Recommended Disposition /[] [place] [facility]/ []  Follow-up with PCP Additional Notes: Patient called stating she is still having urinary symptoms of pressure, increased frequency, burning with urination since seeing PCP 03/27/23 and completing cipro . She states there have been no changes in symptoms. Patient is asking if an alternative medication can be prescribed for her symptoms. Care advice reviewed with patient. Per protocol, patient to be evaluated within 24 hours. Patient requesting callback from PCP prior to scheduling.   Copied from CRM [id number]. Topic: Clinical - Medical Advice >> Apr 03, 2023 10:45 AM Viola FALCON wrote: Reason for CRM: Patient was seen on 03/27/23 - says the meds given to her for UTI are not helping and she is still having burning urination. Would like advice on what to do or a new med to be sent to pharmacy Reason for Disposition  [1] Taking antibiotic > 72 hours (3 days) for UTI AND [2] flank or lower back pain is SAME (unchanged, not better)  Answer Assessment - Initial Assessment Questions 1. MAIN SYMPTOM: What is the main symptom you are concerned about? (e.g., painful urination, urine frequency)     Urinary frequency, burning, pressure 2. BETTER-SAME-WORSE: Are you getting better, staying the same, or getting worse compared to how you felt at your last visit to the doctor (most recent medical visit)?     Was seen 1/7 and given three days of cipro , states there has been no improvement 3. PAIN: How bad is the pain?  (e.g., Scale 1-10; mild, moderate, or severe)   - MILD (1-3): complains slightly about urination hurting   -  MODERATE (4-7): interferes with normal activities     - SEVERE (8-10): excruciating, unwilling or unable to urinate because of the pain      Denies pain 4. FEVER: Do you have a fever? If Yes, ask: What is it, how was it measured, and when did it start?     Denies 5. OTHER SYMPTOMS: Do you have any other symptoms? (e.g., blood in the urine, flank pain, vaginal discharge)     Denies 6. DIAGNOSIS: When was the UTI diagnosed? By whom? Was it a kidney infection, bladder infection or both?     PCP- dx with UTI 7. ANTIBIOTIC: What antibiotic(s) are you taking? How many times per day?     Finished Cipro  03/30/23 8. ANTIBIOTIC - START DATE: When did you start taking the antibiotic?     03/27/23  Protocols used: Urinary Tract Infection on Antibiotic Follow-up Call - [facility]

## 2023-04-05 ENCOUNTER — Other Ambulatory Visit (INDEPENDENT_AMBULATORY_CARE_PROVIDER_SITE_OTHER): Payer: Medicare Other

## 2023-04-05 DIAGNOSIS — R300 Dysuria: Secondary | ICD-10-CM

## 2023-04-05 LAB — URINALYSIS, ROUTINE W REFLEX MICROSCOPIC
Bilirubin Urine: NEGATIVE
Hgb urine dipstick: NEGATIVE
Ketones, ur: NEGATIVE
Nitrite: NEGATIVE
Specific Gravity, Urine: 1.015 (ref 1.000–1.030)
Total Protein, Urine: NEGATIVE
Urine Glucose: NEGATIVE
Urobilinogen, UA: 0.2 (ref 0.0–1.0)
pH: 6.5 (ref 5.0–8.0)

## 2023-04-06 NOTE — Telephone Encounter (Signed)
Copied from CRM [id number]. Topic: Clinical - Lab/Test Results >> Apr 06, 2023  9:46 AM Kathryne Eriksson wrote: Reason for CRM: Urine Sample Results >> Apr 06, 2023  9:48 AM Kathryne Eriksson wrote: Patient states she missed the call from the office in regards to her urine sample. Patient states she wants to receive a call back so that she'll know what exactly she's suppose to do, if she needs to be taking any form of medication or what. Call back number [phone]

## 2023-04-07 LAB — URINE CULTURE

## 2023-04-09 ENCOUNTER — Ambulatory Visit (INDEPENDENT_AMBULATORY_CARE_PROVIDER_SITE_OTHER): Payer: Medicare Other

## 2023-04-09 ENCOUNTER — Encounter: Payer: Self-pay | Admitting: Internal Medicine

## 2023-04-09 ENCOUNTER — Other Ambulatory Visit: Payer: Self-pay | Admitting: Internal Medicine

## 2023-04-09 DIAGNOSIS — E538 Deficiency of other specified B group vitamins: Secondary | ICD-10-CM

## 2023-04-09 MED ORDER — CYANOCOBALAMIN 1000 MCG/ML IJ SOLN
1000.0000 ug | Freq: Once | INTRAMUSCULAR | Status: AC
  Administered 2023-04-09: 1000 ug via INTRAMUSCULAR

## 2023-04-09 MED ORDER — CEPHALEXIN 500 MG PO CAPS: 500.0000 mg | ORAL_CAPSULE | Freq: Two times a day (BID) | ORAL | 0 refills | Status: AC

## 2023-04-09 NOTE — Progress Notes (Signed)
Pt was given B12 injec w/o any complications.

## 2023-04-11 NOTE — Telephone Encounter (Signed)
Spoke with patient, she is aware of her results and has picked up medication

## 2023-04-16 ENCOUNTER — Ambulatory Visit (INDEPENDENT_AMBULATORY_CARE_PROVIDER_SITE_OTHER): Payer: Medicare Other

## 2023-04-16 DIAGNOSIS — H40053 Ocular hypertension, bilateral: Secondary | ICD-10-CM | POA: Diagnosis not present

## 2023-04-16 DIAGNOSIS — E538 Deficiency of other specified B group vitamins: Secondary | ICD-10-CM

## 2023-04-16 MED ORDER — CYANOCOBALAMIN 1000 MCG/ML IJ SOLN
1000.0000 ug | Freq: Once | INTRAMUSCULAR | Status: AC
  Administered 2023-04-16: 1000 ug via INTRAMUSCULAR

## 2023-04-16 NOTE — Progress Notes (Signed)
Pt here for 3/4 WEEKLY B12 injection per Dr. Okey Dupre  B12 given IM and pt tolerated injection well.  Next B12 injection scheduled for 04/23/2023  Advised patient to report to the office immediately if she notices any adverse reactions. She gave a verbal understanding.

## 2023-04-23 ENCOUNTER — Ambulatory Visit (INDEPENDENT_AMBULATORY_CARE_PROVIDER_SITE_OTHER): Payer: Medicare Other

## 2023-04-23 DIAGNOSIS — E538 Deficiency of other specified B group vitamins: Secondary | ICD-10-CM

## 2023-04-23 MED ORDER — CYANOCOBALAMIN 1000 MCG/ML IJ SOLN
1000.0000 ug | Freq: Once | INTRAMUSCULAR | Status: AC
  Administered 2023-04-23: 1000 ug via INTRAMUSCULAR

## 2023-04-23 NOTE — Progress Notes (Signed)
After obtaining consent, and per orders of Dr. Sharlet Salina, injection of B12 given by Marrian Salvage. Patient instructed to report any adverse reaction to me immediately.

## 2023-04-30 DIAGNOSIS — H40053 Ocular hypertension, bilateral: Secondary | ICD-10-CM | POA: Diagnosis not present

## 2023-05-28 DIAGNOSIS — H2512 Age-related nuclear cataract, left eye: Secondary | ICD-10-CM | POA: Diagnosis not present

## 2023-05-28 DIAGNOSIS — H52209 Unspecified astigmatism, unspecified eye: Secondary | ICD-10-CM | POA: Diagnosis not present

## 2023-05-28 DIAGNOSIS — H25812 Combined forms of age-related cataract, left eye: Secondary | ICD-10-CM | POA: Diagnosis not present

## 2023-05-29 DIAGNOSIS — H2511 Age-related nuclear cataract, right eye: Secondary | ICD-10-CM | POA: Diagnosis not present

## 2023-06-11 DIAGNOSIS — H25041 Posterior subcapsular polar age-related cataract, right eye: Secondary | ICD-10-CM | POA: Diagnosis not present

## 2023-06-11 DIAGNOSIS — H2511 Age-related nuclear cataract, right eye: Secondary | ICD-10-CM | POA: Diagnosis not present

## 2023-06-11 DIAGNOSIS — H25011 Cortical age-related cataract, right eye: Secondary | ICD-10-CM | POA: Diagnosis not present

## 2023-06-11 DIAGNOSIS — H25811 Combined forms of age-related cataract, right eye: Secondary | ICD-10-CM | POA: Diagnosis not present

## 2023-06-11 DIAGNOSIS — H52209 Unspecified astigmatism, unspecified eye: Secondary | ICD-10-CM | POA: Diagnosis not present

## 2023-07-17 ENCOUNTER — Ambulatory Visit (INDEPENDENT_AMBULATORY_CARE_PROVIDER_SITE_OTHER)

## 2023-07-17 ENCOUNTER — Ambulatory Visit (INDEPENDENT_AMBULATORY_CARE_PROVIDER_SITE_OTHER): Admitting: Internal Medicine

## 2023-07-17 ENCOUNTER — Encounter: Payer: Self-pay | Admitting: Internal Medicine

## 2023-07-17 VITALS — BP 140/80 | HR 65 | Temp 97.9°F | Ht 67.0 in | Wt 167.0 lb

## 2023-07-17 DIAGNOSIS — M50222 Other cervical disc displacement at C5-C6 level: Secondary | ICD-10-CM | POA: Diagnosis not present

## 2023-07-17 DIAGNOSIS — M542 Cervicalgia: Secondary | ICD-10-CM | POA: Insufficient documentation

## 2023-07-17 DIAGNOSIS — M9931 Osseous stenosis of neural canal of cervical region: Secondary | ICD-10-CM | POA: Diagnosis not present

## 2023-07-17 DIAGNOSIS — M8580 Other specified disorders of bone density and structure, unspecified site: Secondary | ICD-10-CM | POA: Diagnosis not present

## 2023-07-17 DIAGNOSIS — R3915 Urgency of urination: Secondary | ICD-10-CM | POA: Insufficient documentation

## 2023-07-17 DIAGNOSIS — K080 Exfoliation of teeth due to systemic causes: Secondary | ICD-10-CM | POA: Diagnosis not present

## 2023-07-17 DIAGNOSIS — M47812 Spondylosis without myelopathy or radiculopathy, cervical region: Secondary | ICD-10-CM | POA: Diagnosis not present

## 2023-07-17 DIAGNOSIS — I10 Essential (primary) hypertension: Secondary | ICD-10-CM | POA: Diagnosis not present

## 2023-07-17 LAB — POCT URINALYSIS DIPSTICK
Bilirubin, UA: NEGATIVE
Blood, UA: NEGATIVE
Glucose, UA: NEGATIVE
Ketones, UA: NEGATIVE
Leukocytes, UA: NEGATIVE
Nitrite, UA: NEGATIVE
Protein, UA: NEGATIVE
Spec Grav, UA: 1.01 (ref 1.010–1.025)
Urobilinogen, UA: NEGATIVE U/dL — AB
pH, UA: 6 (ref 5.0–8.0)

## 2023-07-17 MED ORDER — AMLODIPINE BESYLATE 10 MG PO TABS: 10.0000 mg | ORAL_TABLET | Freq: Every day | ORAL | 3 refills | Status: DC

## 2023-07-17 NOTE — Assessment & Plan Note (Signed)
 U/A done in office not consistent with infection. We discussed water  intake, caffeine and going regularly.

## 2023-07-17 NOTE — Progress Notes (Signed)
   Subjective:   Patient ID: Kaylee Davis, female    DOB: [birth year], 83 y.o.   MRN: [id number]  HPI The patient is an 83 YO female coming in for neck pain (going on 1 month not improving no trigger) and concerned about uti (wants to be checked no clear symptoms) and blood pressure (running high and has doubled amlodipine  and this seems to be helping some).   Review of Systems  Constitutional: Negative.   HENT: Negative.    Eyes: Negative.   Respiratory:  Negative for cough, chest tightness and shortness of breath.   Cardiovascular:  Negative for chest pain, palpitations and leg swelling.  Gastrointestinal:  Negative for abdominal distention, abdominal pain, constipation, diarrhea, nausea and vomiting.  Musculoskeletal:  Positive for neck pain.  Skin: Negative.   Neurological: Negative.   Psychiatric/Behavioral: Negative.      Objective:  Physical Exam Constitutional:      Appearance: She is well-developed.  HENT:     Head: Normocephalic and atraumatic.  Cardiovascular:     Rate and Rhythm: Normal rate and regular rhythm.  Pulmonary:     Effort: Pulmonary effort is normal. No respiratory distress.     Breath sounds: Normal breath sounds. No wheezing or rales.  Abdominal:     General: Bowel sounds are normal. There is no distension.     Palpations: Abdomen is soft.     Tenderness: There is no abdominal tenderness. There is no rebound.  Musculoskeletal:        General: Tenderness present.     Cervical back: Normal range of motion.  Skin:    General: Skin is warm and dry.  Neurological:     Mental Status: She is alert and oriented to person, place, and time.     Coordination: Coordination normal.     Vitals:   07/17/23 1431  BP: (!) 140/80  Pulse: 65  Temp: 97.9 F (36.6 C)  TempSrc: Oral  SpO2: 98%  Weight: 167 lb (75.8 kg)  Height: 5\' 7"  (1.702 m)    Assessment & Plan:

## 2023-07-17 NOTE — Assessment & Plan Note (Signed)
 Pain started about 1 month ago and not improving. She does not have weakness, numbness. Needs x-ray ordered today. If normal will try PT.

## 2023-07-17 NOTE — Assessment & Plan Note (Signed)
 BP is high and she has doubled amlodipine . This has reduced BP some. Will change rx new done today for amlodipine  10 mg daily.

## 2023-07-17 NOTE — Patient Instructions (Signed)
 We will do the x-ray today. We will increase amlodipine  to 10 mg daily.   The urine is clear.

## 2023-07-30 ENCOUNTER — Encounter: Payer: Self-pay | Admitting: Internal Medicine

## 2023-08-01 ENCOUNTER — Ambulatory Visit: Payer: Self-pay

## 2023-08-16 DIAGNOSIS — B961 Klebsiella pneumoniae [K. pneumoniae] as the cause of diseases classified elsewhere: Secondary | ICD-10-CM | POA: Diagnosis not present

## 2023-08-16 DIAGNOSIS — N390 Urinary tract infection, site not specified: Secondary | ICD-10-CM | POA: Diagnosis not present

## 2023-08-16 DIAGNOSIS — N3020 Other chronic cystitis without hematuria: Secondary | ICD-10-CM | POA: Diagnosis not present

## 2023-08-28 DIAGNOSIS — K080 Exfoliation of teeth due to systemic causes: Secondary | ICD-10-CM | POA: Diagnosis not present

## 2023-09-01 DIAGNOSIS — R300 Dysuria: Secondary | ICD-10-CM | POA: Diagnosis not present

## 2023-09-01 DIAGNOSIS — R82998 Other abnormal findings in urine: Secondary | ICD-10-CM | POA: Diagnosis not present

## 2023-09-07 DIAGNOSIS — K080 Exfoliation of teeth due to systemic causes: Secondary | ICD-10-CM | POA: Diagnosis not present

## 2023-09-11 DIAGNOSIS — R300 Dysuria: Secondary | ICD-10-CM | POA: Diagnosis not present

## 2023-09-14 DIAGNOSIS — H40053 Ocular hypertension, bilateral: Secondary | ICD-10-CM | POA: Diagnosis not present

## 2023-09-24 ENCOUNTER — Ambulatory Visit: Payer: Self-pay

## 2023-09-24 DIAGNOSIS — N952 Postmenopausal atrophic vaginitis: Secondary | ICD-10-CM | POA: Diagnosis not present

## 2023-09-24 DIAGNOSIS — N3020 Other chronic cystitis without hematuria: Secondary | ICD-10-CM | POA: Diagnosis not present

## 2023-09-24 NOTE — Telephone Encounter (Signed)
 FYI Only or Action Required?: Action required by provider: request for appointment.  Patient was last seen in primary care on 07/17/2023 by Rollene Almarie LABOR, MD. Called Nurse Triage reporting Leg Swelling. Symptoms began several days ago. Interventions attempted: Rest, hydration, or home remedies. Symptoms are: unchanged.  Triage Disposition: See PCP When Office is Open (Within 3 Days)  Patient/caregiver understands and will follow disposition?: YesCopied from CRM [id number]. Topic: Clinical - Red Word Triage >> Sep 24, 2023 10:00 AM Rachelle R wrote: [facility] that prompted transfer to Nurse Triage: Patient states over the weekend has swollen as well as her right foot. Reason for Disposition  MILD or MODERATE ankle swelling (e.g., can't move joint normally, can't do usual activities) (Exceptions: Itchy, localized swelling; swelling is chronic.)  Answer Assessment - Initial Assessment Questions 1. LOCATION: Which ankle is swollen? Where is the swelling?     Both ankles 2. ONSET: When did the swelling start?     Friday 3. SWELLING: How bad is the swelling? Or, How large is it? (e.g., mild, moderate, severe; size of localized swelling)    - NONE: No joint swelling.   - LOCALIZED: Localized; small area of puffy or swollen skin (e.g., insect bite, skin irritation).   - MILD: Joint looks or feels mildly swollen or puffy.   - MODERATE: Swollen; interferes with normal activities (e.g., work or school); decreased range of movement; may be limping.   - SEVERE: Very swollen; can't move swollen joint at all; limping a lot or unable to walk.     Mild  4. PAIN: Is there any pain? If Yes, ask: How bad is it? (Scale 1-10; or mild, moderate, severe)   - NONE (0): no pain.   - MILD (1-3): doesn't interfere with normal activities.    - MODERATE (4-7): interferes with normal activities (e.g., work or school) or awakens from sleep, limping.    - SEVERE (8-10): excruciating pain, unable to do any  normal activities, unable to walk.      Denies  5. CAUSE: What do you think caused the ankle swelling?     Not sure 6. OTHER SYMPTOMS: Do you have any other symptoms? (e.g., fever, chest pain, difficulty breathing, calf pain)     denies   Pt stated swelling started Friday. Pt has been eating lots of watermelon for dinner and added lots of salt and feels this is cause. Pt stated she didn't eat salt yesterday and swelling has improved since yesterday.  Protocols used: Ankle Swelling-A-AH

## 2023-09-25 ENCOUNTER — Encounter: Payer: Self-pay | Admitting: Internal Medicine

## 2023-09-25 ENCOUNTER — Ambulatory Visit (INDEPENDENT_AMBULATORY_CARE_PROVIDER_SITE_OTHER): Admitting: Internal Medicine

## 2023-09-25 VITALS — BP 108/68 | HR 92 | Temp 97.8°F | Ht 67.0 in | Wt 164.6 lb

## 2023-09-25 DIAGNOSIS — E538 Deficiency of other specified B group vitamins: Secondary | ICD-10-CM

## 2023-09-25 DIAGNOSIS — I10 Essential (primary) hypertension: Secondary | ICD-10-CM

## 2023-09-25 DIAGNOSIS — E559 Vitamin D deficiency, unspecified: Secondary | ICD-10-CM | POA: Diagnosis not present

## 2023-09-25 DIAGNOSIS — R600 Localized edema: Secondary | ICD-10-CM | POA: Diagnosis not present

## 2023-09-25 DIAGNOSIS — R609 Edema, unspecified: Secondary | ICD-10-CM | POA: Insufficient documentation

## 2023-09-25 LAB — CBC WITH DIFFERENTIAL/PLATELET
Basophils Absolute: 0.1 K/uL (ref 0.0–0.1)
Basophils Relative: 1 % (ref 0.0–3.0)
Eosinophils Absolute: 0.1 K/uL (ref 0.0–0.7)
Eosinophils Relative: 0.9 % (ref 0.0–5.0)
HCT: 39.8 % (ref 36.0–46.0)
Hemoglobin: 13.4 g/dL (ref 12.0–15.0)
Lymphocytes Relative: 20 % (ref 12.0–46.0)
Lymphs Abs: 1.4 K/uL (ref 0.7–4.0)
MCHC: 33.6 g/dL (ref 30.0–36.0)
MCV: 84.9 fl (ref 78.0–100.0)
Monocytes Absolute: 0.5 K/uL (ref 0.1–1.0)
Monocytes Relative: 6.9 % (ref 3.0–12.0)
Neutro Abs: 4.9 K/uL (ref 1.4–7.7)
Neutrophils Relative %: 71.2 % (ref 43.0–77.0)
Platelets: 361 K/uL (ref 150.0–400.0)
RBC: 4.69 Mil/uL (ref 3.87–5.11)
RDW: 13.7 % (ref 11.5–15.5)
WBC: 6.9 K/uL (ref 4.0–10.5)

## 2023-09-25 LAB — COMPREHENSIVE METABOLIC PANEL WITH GFR
ALT: 9 U/L (ref 0–35)
AST: 21 U/L (ref 0–37)
Albumin: 4.4 g/dL (ref 3.5–5.2)
Alkaline Phosphatase: 83 U/L (ref 39–117)
BUN: 17 mg/dL (ref 6–23)
CO2: 28 meq/L (ref 19–32)
Calcium: 9.7 mg/dL (ref 8.4–10.5)
Chloride: 102 meq/L (ref 96–112)
Creatinine, Ser: 1.13 mg/dL (ref 0.40–1.20)
GFR: 44.42 mL/min — ABNORMAL LOW (ref >60.00)
Glucose, Bld: 104 mg/dL — ABNORMAL HIGH (ref 70–99)
Potassium: 4.1 meq/L (ref 3.5–5.1)
Sodium: 138 meq/L (ref 135–145)
Total Bilirubin: 0.4 mg/dL (ref 0.2–1.2)
Total Protein: 7.3 g/dL (ref 6.0–8.3)

## 2023-09-25 LAB — VITAMIN B12: Vitamin B-12: 573 pg/mL (ref 211–911)

## 2023-09-25 LAB — VITAMIN D 25 HYDROXY (VIT D DEFICIENCY, FRACTURES): VITD: 21.08 ng/mL — ABNORMAL LOW (ref 30.00–100.00)

## 2023-09-25 LAB — TSH: TSH: 2.59 u[IU]/mL (ref 0.35–5.50)

## 2023-09-25 MED ORDER — FUROSEMIDE 20 MG PO TABS: 20.0000 mg | ORAL_TABLET | Freq: Every day | ORAL | 3 refills | Status: DC | PRN

## 2023-09-25 MED ORDER — AMLODIPINE BESYLATE 10 MG PO TABS: 5.0000 mg | ORAL_TABLET | Freq: Every day | ORAL | Status: DC

## 2023-09-25 NOTE — Progress Notes (Signed)
 Subjective:  Patient ID: Kaylee Davis, female    DOB: 1941-03-12  Age: 83 y.o. MRN: [id number]  CC: Medical Management of Chronic Issues (Pt states she has been having swelling in her feet and ankles x3 days )   HPI Erminio KATHEE Sauers presents for B ankle swelling R>L Pt has been eating a lot of salads w/dressing and watermelons w/salt   Outpatient Medications Prior to Visit  Medication Sig Dispense Refill   clindamycin (CLEOCIN) 300 MG capsule TAKE 2 CAPSULE BY MOUTH 1 HOUR PRIOR TO DENTAL PROCEDURE     doxycycline (VIBRAMYCIN) 100 MG capsule TAKE 1 CAPSULE BY MOUTH TWICE DAILY WITH A FULL GLASS OF WATER  FOR 10 DAYS. DO NOT LIE DOWN UNTIL 1 HOUR AFTER TAKING. CAUSES SUN SENSITIVITY     EPINEPHrine  0.3 mg/0.3 mL IJ SOAJ injection Inject 0.3 mg into the muscle as needed for anaphylaxis. 2 each 0   estradiol (ESTRACE) 0.1 MG/GM vaginal cream Place vaginally.     gatifloxacin (ZYMAXID) 0.5 % SOLN INSTILL 1 DROP INTO LEFT EYE FOUR TIMES DAILY     ketorolac  (ACULAR ) 0.5 % ophthalmic solution INSTILL 1 DROP IN LEFT EYE FOUR TIMES DAILY AS DIRECTED     prednisoLONE acetate (PRED FORTE) 1 % ophthalmic suspension INTILL 1 DROP INTO THE RIGHT EYE FOUR TIMES DAILY AS DIRECTED. PLEASE SHAKE WELL BEFORE EACH USE     PREMARIN  vaginal cream Place 1 Applicatorful vaginally once a week.   5   trimethoprim  (TRIMPEX ) 100 MG tablet Take 100 mg by mouth daily.     Vitamin D , Ergocalciferol , (DRISDOL ) 1.25 MG (50000 UNIT) CAPS capsule Take 1 capsule (50,000 Units total) by mouth every 7 (seven) days. 12 capsule 0   amLODipine  (NORVASC ) 10 MG tablet Take 1 tablet (10 mg total) by mouth daily. 90 tablet 3   No facility-administered medications prior to visit.    ROS: Review of Systems  Constitutional:  Positive for fatigue. Negative for activity change, appetite change, chills and unexpected weight change.  HENT:  Negative for congestion, mouth sores and sinus pressure.   Eyes:  Negative for visual  disturbance.  Respiratory:  Negative for cough and chest tightness.   Cardiovascular:  Positive for leg swelling.  Gastrointestinal:  Negative for abdominal pain and nausea.  Genitourinary:  Negative for difficulty urinating, frequency and vaginal pain.  Musculoskeletal:  Negative for back pain and gait problem.  Skin:  Negative for pallor and rash.  Neurological:  Negative for dizziness, tremors, weakness, numbness and headaches.  Psychiatric/Behavioral:  Negative for confusion, sleep disturbance and suicidal ideas.     Objective:  BP 108/68   Pulse 92   Temp 97.8 F (36.6 C) (Oral)   Ht 5' 7 (1.702 m)   Wt 164 lb 9.6 oz (74.7 kg)   LMP 03/21/1983   SpO2 97%   BMI 25.78 kg/m   BP Readings from Last 3 Encounters:  09/25/23 108/68  07/17/23 (!) 140/80  03/27/23 138/80    Wt Readings from Last 3 Encounters:  09/25/23 164 lb 9.6 oz (74.7 kg)  07/17/23 167 lb (75.8 kg)  03/27/23 163 lb (73.9 kg)    Physical Exam Constitutional:      General: She is not in acute distress.    Appearance: She is well-developed. She is obese.  HENT:     Head: Normocephalic.     Right Ear: External ear normal.     Left Ear: External ear normal.     Nose: Nose normal.  Eyes:     General:        Right eye: No discharge.        Left eye: No discharge.     Conjunctiva/sclera: Conjunctivae normal.     Pupils: Pupils are equal, round, and reactive to light.  Neck:     Thyroid : No thyromegaly.     Vascular: No JVD.     Trachea: No tracheal deviation.  Cardiovascular:     Rate and Rhythm: Normal rate and regular rhythm.     Heart sounds: Normal heart sounds.  Pulmonary:     Effort: No respiratory distress.     Breath sounds: No stridor. No wheezing.  Abdominal:     General: Bowel sounds are normal. There is no distension.     Palpations: Abdomen is soft. There is no mass.     Tenderness: There is no abdominal tenderness. There is no guarding or rebound.  Musculoskeletal:         General: No tenderness.     Cervical back: Normal range of motion and neck supple. No rigidity.  Lymphadenopathy:     Cervical: No cervical adenopathy.  Skin:    Findings: No erythema or rash.  Neurological:     Cranial Nerves: No cranial nerve deficit.     Motor: No abnormal muscle tone.     Coordination: Coordination normal.     Deep Tendon Reflexes: Reflexes normal.  Psychiatric:        Behavior: Behavior normal.        Thought Content: Thought content normal.        Judgment: Judgment normal.   R ankle 1+ edema, L - trace  Lab Results  Component Value Date   WBC 6.2 03/28/2023   HGB 13.3 03/28/2023   HCT 40.3 03/28/2023   PLT 339.0 03/28/2023   GLUCOSE 90 03/28/2023   CHOL 217 (H) 03/28/2023   TRIG 96.0 03/28/2023   HDL 62.60 03/28/2023   LDLDIRECT 120.0 03/07/2021   LDLCALC 135 (H) 03/28/2023   ALT 9 03/28/2023   AST 21 03/28/2023   NA 141 03/28/2023   K 4.0 03/28/2023   CL 104 03/28/2023   CREATININE 0.82 03/28/2023   BUN 11 03/28/2023   CO2 29 03/28/2023   TSH 4.20 03/28/2023   INR 0.94 03/01/2018   HGBA1C 6.0 03/28/2023    CT ABDOMEN PELVIS W CONTRAST Result Date: 07/21/2022 CLINICAL DATA:  Left lower quadrant abdominal pain. Nausea, vomiting and diarrhea x1 week. EXAM: CT ABDOMEN AND PELVIS WITH CONTRAST TECHNIQUE: Multidetector CT imaging of the abdomen and pelvis was performed using the standard protocol following bolus administration of intravenous contrast. RADIATION DOSE REDUCTION: This exam was performed according to the departmental dose-optimization program which includes automated exposure control, adjustment of the mA and/or kV according to patient size and/or use of iterative reconstruction technique. CONTRAST:  80mL OMNIPAQUE  IOHEXOL  300 MG/ML  SOLN COMPARISON:  CTA chest/abdomen/pelvis 02/16/2016. FINDINGS: Lower chest: No acute findings. Unchanged calcification in the right atrium. Hepatobiliary: No focal liver abnormality is seen. No gallstones,  gallbladder wall thickening, or biliary dilatation. Pancreas: Unremarkable. No pancreatic ductal dilatation or surrounding inflammatory changes. Spleen: Normal. Adrenals/Urinary Tract: Adrenal glands are unremarkable. Patchy hypoenhancement of the renal cortex, which could represent chronic scarring versus pyelonephritis. No mass or hydronephrosis. 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 Stomach/Bowel: Normal stomach. Congenital malrotation of the bowel. No dilated loops of small bowel to indicate obstruction. Prior appendectomy. No bowel wall  thickening or surrounding inflammation. Vascular/Lymphatic: Aortic atherosclerosis. No enlarged abdominal or pelvic lymph nodes. Reproductive: Status post hysterectomy. No adnexal masses. Other: No abdominal wall hernia or abnormality. No abdominopelvic ascites. Musculoskeletal: No acute or significant osseous findings. IMPRESSION: 1. Patchy hypoenhancement of the renal cortex, which could represent chronic scarring versus pyelonephritis. Recommend correlation with urinalysis. 2. No other acute findings in the abdomen or pelvis. 3. Aortic Atherosclerosis (ICD10-I70.0). Electronically Signed   By: Ryan Chess M.D.   On: 07/21/2022 11:22    Assessment & Plan:   Problem List Items Addressed This Visit     Essential hypertension   Controlled Worsening B ankle swelling R>L Pt has been eating a lot of salads w/dressing and watermelons w/salt - reduce salt On Norvasc  10 mg/d - reduce to 5 mg/d Use Furosemide  20-40 mg prn       Relevant Medications   furosemide  (LASIX ) 20 MG tablet   amLODipine  (NORVASC ) 10 MG tablet   Vitamin D  deficiency   On Vit D? - not  sure Check levels      Relevant Orders   VITAMIN D  25 Hydroxy (Vit-D Deficiency, Fractures)   B12 deficiency   On Vit B12 po Check labs      Relevant Orders   Vitamin B12   Edema - Primary   Worsening B ankle swelling R>L Pt has been  eating a lot of salads w/dressing and watermelons w/salt On Norvasc  10 mg/d - reduce to 5 mg/d Furosemide  20 mg prn       Relevant Orders   CBC with Differential/Platelet   Comprehensive metabolic panel with GFR   TSH      Meds ordered this encounter  Medications   furosemide  (LASIX ) 20 MG tablet    Sig: Take 1-2 tablets (20-40 mg total) by mouth daily as needed for edema.    Dispense:  30 tablet    Refill:  3   amLODipine  (NORVASC ) 10 MG tablet    Sig: Take 0.5 tablets (5 mg total) by mouth daily.      Follow-up: Return in about 6 weeks (around 11/06/2023) for f/u with PCP.  Marolyn Noel, MD

## 2023-09-25 NOTE — Assessment & Plan Note (Signed)
 Worsening B ankle swelling R>L Pt has been eating a lot of salads w/dressing and watermelons w/salt On Norvasc  10 mg/d - reduce to 5 mg/d Furosemide  20 mg prn

## 2023-09-25 NOTE — Assessment & Plan Note (Signed)
 On Vit B12 po Check labs

## 2023-09-25 NOTE — Assessment & Plan Note (Addendum)
 On Vit D? - not  sure Check levels

## 2023-09-25 NOTE — Assessment & Plan Note (Signed)
 Controlled Worsening B ankle swelling R>L Pt has been eating a lot of salads w/dressing and watermelons w/salt - reduce salt On Norvasc  10 mg/d - reduce to 5 mg/d Use Furosemide  20-40 mg prn

## 2023-09-28 ENCOUNTER — Ambulatory Visit: Payer: Self-pay | Admitting: Internal Medicine

## 2023-09-28 MED ORDER — VITAMIN D (ERGOCALCIFEROL) 1.25 MG (50000 UNIT) PO CAPS: 50000.0000 [IU] | ORAL_CAPSULE | ORAL | 0 refills | Status: AC

## 2023-09-28 MED ORDER — VITAMIN D3 50 MCG (2000 UT) PO CAPS: 2000.0000 [IU] | ORAL_CAPSULE | Freq: Every day | ORAL | Status: AC

## 2023-10-02 DIAGNOSIS — N3020 Other chronic cystitis without hematuria: Secondary | ICD-10-CM | POA: Diagnosis not present

## 2023-10-31 DIAGNOSIS — M25512 Pain in left shoulder: Secondary | ICD-10-CM | POA: Diagnosis not present

## 2023-10-31 DIAGNOSIS — M542 Cervicalgia: Secondary | ICD-10-CM | POA: Diagnosis not present

## 2023-11-06 ENCOUNTER — Ambulatory Visit: Admitting: Internal Medicine

## 2023-11-06 ENCOUNTER — Encounter: Payer: Self-pay | Admitting: Internal Medicine

## 2023-11-06 VITALS — BP 122/84 | HR 65 | Temp 97.9°F | Ht 67.0 in | Wt 163.0 lb

## 2023-11-06 DIAGNOSIS — R300 Dysuria: Secondary | ICD-10-CM | POA: Diagnosis not present

## 2023-11-06 DIAGNOSIS — R600 Localized edema: Secondary | ICD-10-CM | POA: Diagnosis not present

## 2023-11-06 DIAGNOSIS — M4722 Other spondylosis with radiculopathy, cervical region: Secondary | ICD-10-CM

## 2023-11-06 DIAGNOSIS — I10 Essential (primary) hypertension: Secondary | ICD-10-CM | POA: Diagnosis not present

## 2023-11-06 MED ORDER — AMLODIPINE BESYLATE 5 MG PO TABS: 5.0000 mg | ORAL_TABLET | Freq: Every day | ORAL | 3 refills | Status: DC

## 2023-11-06 NOTE — Progress Notes (Unsigned)
   Subjective:   Patient ID: Kaylee Davis, female    DOB: 01-29-1941, 83 y.o.   MRN: [id number]  Discussed the use of AI scribe software for clinical note transcription with the patient, who gave verbal consent to proceed.  History of Present Illness Kaylee Davis is an 83 year old female with hypertension and recurrent urinary tract infections who presents with concerns about medication management and joint pain.  She has recently experienced changes in her hypertension medication regimen. Her amlodipine  dose was reduced to 5 mg, and furosemide  was added at two tablets daily. The swelling in her ankles and foot has resolved, but she continues to experience persistent puffiness under her eyes.  She has a history of recurrent urinary tract infections and has increased her Premarin  to twice a week, which she finds beneficial. She is concerned about the potential need for daily antibiotics due to her history of joint replacements and her husband's past experience with C. diff infection.  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 She completed a course of prednisone  and experiences significant pain in her neck and shoulder that radiates to her muscles. Her knees and hips, which have undergone joint replacements, are currently pain-free.  Review of Systems  Objective:  Physical Exam  Vitals:   11/06/23 1505  BP: 122/84  Pulse: 65  Temp: 97.9 F (36.6 C)  TempSrc: Oral  SpO2: 98%  Weight: 163 lb (73.9 kg)  Height: 5' 7 (1.702 m)    Assessment and Plan Assessment & Plan Recurrent urinary tract infections   Premarin  increased to twice weekly to reduce infection recurrence. Daily antibiotics avoided due to C. diff risk and antibiotic resistance. Antibiotics considered for use at symptom onset. Continue Premarin  twice a week and avoid daily antibiotics. Consider having an antibiotic with refills available for early symptom treatment.  Hypertension    Hypertension is well-managed with amlodipine  5 mg after dose reduction due to peripheral edema.  Peripheral edema   Edema resolved with amlodipine  dose reduction and furosemide  addition. Continue current regimen of amlodipine  5 mg and furosemide  as prescribed.  Cervical spondylosis with radiculopathy   Flare-up managed with prednisone . Neck injections discussed for temporary relief, not a cure. Surgery not recommended unless other treatments fail. Proceed with scheduled appointment for neck injection, potentially with nurse practitioner using x-ray guidance.

## 2023-11-06 NOTE — Patient Instructions (Addendum)
 You can use the trimethoprim  they gave you for prevention only to treat a urine infection. Take 1 pill twice a day for 5 days.  We have sent in the amlodipine  5 mg to switch back to when you run out of 10 mg.

## 2023-11-07 ENCOUNTER — Other Ambulatory Visit: Payer: Self-pay

## 2023-11-07 ENCOUNTER — Other Ambulatory Visit: Payer: Self-pay | Admitting: Internal Medicine

## 2023-11-07 MED ORDER — FUROSEMIDE 20 MG PO TABS: 20.0000 mg | ORAL_TABLET | Freq: Every day | ORAL | 3 refills | Status: DC | PRN

## 2023-11-07 NOTE — Telephone Encounter (Signed)
 Copied from CRM [id number]. Topic: Clinical - Medication Refill >> Nov 07, 2023  1:54 PM Aleatha C wrote: Medication:  furosemide  (LASIX ) 20 MG tablet   Has the patient contacted their pharmacy? Yes (Agent: If no, request that the patient contact the pharmacy for the refill. If patient does not wish to contact the pharmacy document the reason why and proceed with request.) (Agent: If yes, when and what did the pharmacy advise?)  This is the patient's preferred pharmacy:  [id number] - [place], [place] - [address] [facility] [address] [facility] [address] [place] [address] [place] [address] Phone: [phone] Fax: [phone]  Is this the correct pharmacy for this prescription? Yes If no, delete pharmacy and type the correct one.   Has the prescription been filled recently? No  Is the patient out of the medication? Yes  Has the patient been seen for an appointment in the last year OR does the patient have an upcoming appointment? Yes  Can we respond through MyChart? No  Agent: Please be advised that Rx refills may take up to 3 business days. We ask that you follow-up with your pharmacy.

## 2023-11-09 DIAGNOSIS — M4722 Other spondylosis with radiculopathy, cervical region: Secondary | ICD-10-CM | POA: Insufficient documentation

## 2023-11-09 NOTE — Assessment & Plan Note (Signed)
 BP at goal with medication change amlodipine  5 mg daily and lasix  40 mg daily. Continue.

## 2023-11-09 NOTE — Assessment & Plan Note (Signed)
 She has increased frequency of estrace cream and we will give this a few months to see if this helps frequency of UTI. She was counseled by urology about recurrent UTI and we will trigger with early initiation of antibiotics instead of daily.

## 2023-11-09 NOTE — Assessment & Plan Note (Signed)
 Resolved with reduction of amlodipine  and addition of lasix . New rx done for new dosing amlodipine  5 mg daily.

## 2023-11-09 NOTE — Assessment & Plan Note (Signed)
 Flare-up managed with prednisone . Neck injections discussed for temporary relief, not a cure. Surgery not recommended unless other treatments fail. Proceed with scheduled appointment for neck injection, potentially with nurse practitioner using x-ray guidance.

## 2023-11-14 DIAGNOSIS — M5412 Radiculopathy, cervical region: Secondary | ICD-10-CM | POA: Diagnosis not present

## 2023-11-15 DIAGNOSIS — N952 Postmenopausal atrophic vaginitis: Secondary | ICD-10-CM | POA: Diagnosis not present

## 2023-11-15 DIAGNOSIS — N3020 Other chronic cystitis without hematuria: Secondary | ICD-10-CM | POA: Diagnosis not present

## 2023-12-05 DIAGNOSIS — M5412 Radiculopathy, cervical region: Secondary | ICD-10-CM | POA: Diagnosis not present

## 2023-12-18 DIAGNOSIS — H40053 Ocular hypertension, bilateral: Secondary | ICD-10-CM | POA: Diagnosis not present

## 2023-12-21 ENCOUNTER — Ambulatory Visit

## 2023-12-21 DIAGNOSIS — Z23 Encounter for immunization: Secondary | ICD-10-CM | POA: Diagnosis not present

## 2023-12-21 NOTE — Progress Notes (Signed)
 Patient visits today for their high dose flu vaccine. Patient informed of what she had received and tolerated the injection well. Patient notified to reach out to the office if needed.

## 2023-12-24 DIAGNOSIS — K080 Exfoliation of teeth due to systemic causes: Secondary | ICD-10-CM | POA: Diagnosis not present

## 2023-12-26 ENCOUNTER — Other Ambulatory Visit: Payer: Self-pay | Admitting: Internal Medicine

## 2023-12-26 NOTE — Telephone Encounter (Signed)
 Copied from CRM #[id number]. Topic: Clinical - Medication Refill >> Dec 26, 2023  9:48 AM Franky GRADE wrote: Medication: amLODipine  (NORVASC ) 10 MG tablet [[id number]]  Has the patient contacted their pharmacy? Yes (Agent: If no, request that the patient contact the pharmacy for the refill. If patient does not wish to contact the pharmacy document the reason why and proceed with request.) (Agent: If yes, when and what did the pharmacy advise?)  This is the patient's preferred pharmacy:  [id number] - [place], [place] - [address] [facility] [address] [facility] [address] [place] [address] Phone: [phone] Fax: [phone]  Is this the correct pharmacy for this prescription? Yes If no, delete pharmacy and type the correct one.   Has the prescription been filled recently? Yes  Is the patient out of the medication? No, patient accidentally was taking 2 tablets of the 10 mg by accident and Dr.Crawford moved her back down to 5 mg; however, patient would like the refill to be the 10 mg.   Has the patient been seen for an appointment in the last year OR does the patient have an upcoming appointment? Yes  Can we respond through MyChart? Yes  Agent: Please be advised that Rx refills may take up to 3 business days. We ask that you follow-up with your pharmacy.

## 2024-01-01 NOTE — Telephone Encounter (Unsigned)
 Copied from CRM [id number]. Topic: Clinical - Prescription Issue >> Jan 01, 2024  9:08 AM Burnard DEL wrote: Reason for CRM: Patient called in stating that she is out of the amlodipine  10 mg ,due to her taking them incorrectly. She was switched from 5mg  to 10 mg at last OV.However provider told her to take 2 of 5mg  to finish them. Patient finished the 5 mg's and mistakingly started taking 2 of the 10 mg's,now she do not have any more.Patient would like to know if she could have a refill ?

## 2024-01-04 ENCOUNTER — Other Ambulatory Visit: Payer: Self-pay

## 2024-01-04 MED ORDER — AMLODIPINE BESYLATE 5 MG PO TABS: 5.0000 mg | ORAL_TABLET | Freq: Every day | ORAL | 1 refills | Status: DC

## 2024-01-15 ENCOUNTER — Encounter: Payer: Self-pay | Admitting: Internal Medicine

## 2024-01-15 ENCOUNTER — Ambulatory Visit: Admitting: Internal Medicine

## 2024-01-15 VITALS — BP 130/80 | HR 69 | Temp 97.5°F | Ht 67.0 in | Wt 166.6 lb

## 2024-01-15 DIAGNOSIS — R300 Dysuria: Secondary | ICD-10-CM

## 2024-01-15 DIAGNOSIS — R600 Localized edema: Secondary | ICD-10-CM

## 2024-01-15 DIAGNOSIS — I10 Essential (primary) hypertension: Secondary | ICD-10-CM

## 2024-01-15 LAB — POC URINALSYSI DIPSTICK (AUTOMATED)
Bilirubin, UA: NEGATIVE
Blood, UA: NEGATIVE
Glucose, UA: NEGATIVE
Ketones, UA: NEGATIVE
Leukocytes, UA: NEGATIVE
Nitrite, UA: NEGATIVE
Protein, UA: NEGATIVE
Spec Grav, UA: 1.01 (ref 1.010–1.025)
pH, UA: 6 (ref 5.0–8.0)

## 2024-01-15 MED ORDER — AMLODIPINE BESYLATE 10 MG PO TABS: 10.0000 mg | ORAL_TABLET | Freq: Every day | ORAL | 3 refills | Status: AC

## 2024-01-15 NOTE — Progress Notes (Signed)
" ° °  Subjective:   Patient ID: Kaylee Davis, female    DOB: 1941/02/11, 83 y.o.   MRN: [id number]  Discussed the use of AI scribe software for clinical note transcription with the patient, who gave verbal consent to proceed.   History of Present Illness Kaylee Davis is an 83 year old female with hypertension who presents for medication management and follow-up.  She recently encountered an issue with her blood pressure medication dosage, mistakenly taking two 10 mg tablets instead of one, which led to swelling in her feet and ankles. After adjusting the dosage back to 10 mg daily, the swelling resolved. However, she ran out of medication because the prescription was for 5 mg, and she was taking 10 mg. She is currently taking 10 mg daily and monitoring her blood pressure, which has been stable with readings such as 129/79 mmHg and 140/80 mmHg.  Review of Systems  Constitutional: Negative.   HENT: Negative.    Eyes: Negative.   Respiratory:  Negative for cough, chest tightness and shortness of breath.   Cardiovascular:  Negative for chest pain, palpitations and leg swelling.  Gastrointestinal:  Negative for abdominal distention, abdominal pain, constipation, diarrhea, nausea and vomiting.  Musculoskeletal: Negative.   Skin: Negative.   Neurological: Negative.   Psychiatric/Behavioral: Negative.      Objective:  Physical Exam Constitutional:      Appearance: She is well-developed.  HENT:     Head: Normocephalic and atraumatic.  Cardiovascular:     Rate and Rhythm: Normal rate and regular rhythm.  Pulmonary:     Effort: Pulmonary effort is normal. No respiratory distress.     Breath sounds: Normal breath sounds. No wheezing or rales.  Abdominal:     General: Bowel sounds are normal. There is no distension.     Palpations: Abdomen is soft.     Tenderness: There is no abdominal tenderness.  Musculoskeletal:     Cervical back: Normal range of motion.  Skin:    General:  Skin is warm and dry.  Neurological:     Mental Status: She is alert and oriented to person, place, and time.     Coordination: Coordination normal.     Vitals:   01/15/24 1447  BP: 130/80  Pulse: 69  Temp: (!) 97.5 F (36.4 C)  TempSrc: Temporal  SpO2: 98%  Weight: 166 lb 9.6 oz (75.6 kg)  Height: 5' 7 (1.702 m)    Assessment and Plan Assessment & Plan Hypertension   Blood pressure is well-controlled with the current regimen, with readings consistently below 140/90 and no symptoms of dizziness or lightheadedness. Prescribe 10 mg medication daily.  Recurrent urinary tract infections   There have been no recent infections. Premarin  is effective in prevention, and its role in addressing post-menopausal anatomical changes was discussed. Occasional bladder spasms are reported. Continue Premarin  twice a week and check urine for signs of infection.Checked POC U/A today which is negative for infection.  Peripheral edema   Edema has resolved after medication dosage adjustment, with no current swelling reported.   "

## 2024-01-17 NOTE — Assessment & Plan Note (Signed)
 Blood pressure is well-controlled with the current regimen, with readings consistently below 140/90 and no symptoms of dizziness or lightheadedness. Prescribe amlodipine  10 mg medication daily.

## 2024-01-17 NOTE — Assessment & Plan Note (Signed)
 Edema has resolved after medication dosage adjustment, with no current swelling reported.

## 2024-01-17 NOTE — Assessment & Plan Note (Signed)
 There have been no recent infections. Premarin  is effective in prevention, and its role in addressing post-menopausal anatomical changes was discussed. Occasional bladder spasms are reported. Continue Premarin  twice a week and check urine for signs of infection.Checked POC U/A today which is negative for infection.

## 2024-01-29 DIAGNOSIS — K080 Exfoliation of teeth due to systemic causes: Secondary | ICD-10-CM | POA: Diagnosis not present

## 2024-02-27 ENCOUNTER — Ambulatory Visit

## 2024-04-16 ENCOUNTER — Ambulatory Visit (INDEPENDENT_AMBULATORY_CARE_PROVIDER_SITE_OTHER)

## 2024-04-16 ENCOUNTER — Ambulatory Visit (INDEPENDENT_AMBULATORY_CARE_PROVIDER_SITE_OTHER): Admitting: Internal Medicine

## 2024-04-16 ENCOUNTER — Encounter: Payer: Self-pay | Admitting: Internal Medicine

## 2024-04-16 ENCOUNTER — Ambulatory Visit: Payer: Self-pay | Admitting: Internal Medicine

## 2024-04-16 VITALS — BP 118/70 | HR 80 | Temp 98.8°F | Ht 67.0 in | Wt 163.0 lb

## 2024-04-16 DIAGNOSIS — R300 Dysuria: Secondary | ICD-10-CM | POA: Diagnosis not present

## 2024-04-16 DIAGNOSIS — R051 Acute cough: Secondary | ICD-10-CM

## 2024-04-16 LAB — POCT URINALYSIS DIPSTICK
Bilirubin, UA: NEGATIVE
Blood, UA: NEGATIVE
Glucose, UA: NEGATIVE
Ketones, UA: NEGATIVE
Leukocytes, UA: NEGATIVE
Nitrite, UA: NEGATIVE
Protein, UA: NEGATIVE
Spec Grav, UA: 1.015
Urobilinogen, UA: 0.2 U/dL
pH, UA: 6

## 2024-04-16 NOTE — Progress Notes (Signed)
 "   Subjective:    Patient ID: Kaylee Davis, female    DOB: 1940-08-23, 84 y.o.   MRN: [id number]      HPI Kaylee Davis is here for  Chief Complaint  Patient presents with   Cough    Productive cough x 1 day after having trouble swallowing food.  Dry mouth started 1 week ago. Pt stated eye drops prescribed has been making eyes dry and now mouth very dry.   Dysuria    Pt report burning while urinating that started this morning. Denies odor and urgency.     Discussed the use of AI scribe software for clinical note transcription with the patient, who gave verbal consent to proceed.  History of Present Illness Kaylee Davis is an 84 year old female who presents with dry mouth, sore throat, and cough.  She has experienced significant dryness in her eyes and mouth for about a week, with her tongue sticking to the roof of her mouth. She uses latanoprost eye drops for pressure management following cataract surgery. She reports no recent changes in medications or supplements aside from increasing her Nexium  dose last night.  In the last two days, she developed a severe cough, describing it as feeling like 'something coming out of my lungs.' She denies having a cold but experienced a choking sensation when lying down, leading to difficulty swallowing and a sore throat today. She reports hoarseness and feeling lightheaded. No fever, nasal congestion, sinus pain, pressure, or drainage. No shortness of breath, wheezing, chest pain, or heart racing.  She has a history of reflux and takes Nexium  daily. Recently, last night she took Nexium  at night in addition to her morning dose. She denies heartburn but reports throat irritation and dry mouth.  She reports a burning sensation with urination this morning but no urgency, difficulty urinating, or blood in the urine. She has a history of urinary tract infections and is on Premarin  twice a week, which has been effective in preventing  infections.  She mentions a recent dietary change, consuming yogurt for dinner and was not sure if that was the cause of her symptoms.       Medications and allergies reviewed with patient and updated if appropriate.  Medications Ordered Prior to Encounter[1]  Review of Systems  Constitutional:  Negative for fever.  HENT:  Positive for sore throat (dry, irritated), trouble swallowing (last night when laying in bed) and voice change. Negative for congestion, postnasal drip and sinus pressure.   Eyes:        Dry eyes  Respiratory:  Positive for cough (dry cough). Negative for shortness of breath and wheezing.   Cardiovascular:  Negative for chest pain, palpitations and leg swelling.  Gastrointestinal:  Negative for abdominal pain and nausea.       Gerd - nexium  daily  Genitourinary:  Positive for dysuria. Negative for difficulty urinating, frequency, hematuria and urgency.  Neurological:  Positive for light-headedness (today). Negative for headaches.       Objective:   Vitals:   04/16/24 1043  BP: 118/70  Pulse: 80  Temp: 98.8 F (37.1 C)  SpO2: 98%   BP Readings from Last 3 Encounters:  04/16/24 118/70  01/15/24 130/80  11/06/23 122/84   Wt Readings from Last 3 Encounters:  04/16/24 163 lb (73.9 kg)  01/15/24 166 lb 9.6 oz (75.6 kg)  11/06/23 163 lb (73.9 kg)   Body mass index is 25.53 kg/m.    Physical Exam Constitutional:  General: She is not in acute distress.    Appearance: Normal appearance. She is not ill-appearing.  HENT:     Head: Normocephalic and atraumatic.  Eyes:     Conjunctiva/sclera: Conjunctivae normal.  Cardiovascular:     Rate and Rhythm: Normal rate and regular rhythm.  Pulmonary:     Effort: Pulmonary effort is normal. No respiratory distress.     Breath sounds: Normal breath sounds. No wheezing or rales.  Musculoskeletal:     Cervical back: Neck supple. No tenderness.     Right lower leg: No edema.     Left lower leg: No  edema.  Lymphadenopathy:     Cervical: No cervical adenopathy.  Skin:    General: Skin is warm and dry.     Findings: No rash.  Neurological:     Mental Status: She is alert.            Assessment & Plan:     Assessment and Plan Assessment & Plan Acute cough with possible aspiration pneumonitis Acute cough with sore throat, hoarseness, and dry mouth. Recent choking incident raises concern for aspiration pneumonitis. Differential includes heartburn-related irritation. - Ordered chest x-ray to rule out aspiration pneumonitis. - I personally reviewed chest x-ray and no obvious infiltrates seen.  Will wait for official radiology read - Increase Nexium  to twice daily for one week.  Gastroesophageal reflux disease GERD managed with Nexium . Recent increase to twice daily due to symptoms suggestive of reflux-related irritation. - Continue Nexium  twice daily for one week.  Dysuria Recent onset of dysuria with burning sensation. Preliminary urine analysis negative for infection. - Sent urine for culture. - Continue Premarin  twice weekly.   Discussed the importance of her letting us  know if her symptoms are not improving    [1]  Current Outpatient Medications on File Prior to Visit  Medication Sig Dispense Refill   amLODipine  (NORVASC ) 10 MG tablet Take 1 tablet (10 mg total) by mouth daily. 90 tablet 3   Cholecalciferol (VITAMIN D3) 50 MCG (2000 UT) capsule Take 1 capsule (2,000 Units total) by mouth daily.     estradiol (ESTRACE) 0.1 MG/GM vaginal cream Place vaginally.     ketorolac  (ACULAR ) 0.5 % ophthalmic solution INSTILL 1 DROP IN LEFT EYE FOUR TIMES DAILY AS DIRECTED     prednisoLONE acetate (PRED FORTE) 1 % ophthalmic suspension INTILL 1 DROP INTO THE RIGHT EYE FOUR TIMES DAILY AS DIRECTED. PLEASE SHAKE WELL BEFORE EACH USE     PREMARIN  vaginal cream Place 1 Applicatorful vaginally once a week.   5   Vitamin D , Ergocalciferol , (DRISDOL ) 1.25 MG (50000 UNIT) CAPS  capsule Take 1 capsule (50,000 Units total) by mouth every 7 (seven) days. 8 capsule 0   EPINEPHrine  0.3 mg/0.3 mL IJ SOAJ injection Inject 0.3 mg into the muscle as needed for anaphylaxis. 2 each 0   No current facility-administered medications on file prior to visit.   "

## 2024-04-16 NOTE — Patient Instructions (Addendum)
" ° ° ° °  Have a chest xray today.       Medications changes include :   increase nexium  to twice a day for a week - then decrease back to once a day   We will send your urine for culture - the preliminary urine does not look infected.      Return if symptoms worsen or fail to improve.  "

## 2024-04-19 LAB — CULTURE, URINE COMPREHENSIVE

## 2024-04-21 ENCOUNTER — Encounter (HOSPITAL_BASED_OUTPATIENT_CLINIC_OR_DEPARTMENT_OTHER): Payer: Self-pay

## 2024-04-21 ENCOUNTER — Emergency Department (HOSPITAL_BASED_OUTPATIENT_CLINIC_OR_DEPARTMENT_OTHER)

## 2024-04-21 ENCOUNTER — Other Ambulatory Visit (HOSPITAL_BASED_OUTPATIENT_CLINIC_OR_DEPARTMENT_OTHER): Payer: Self-pay

## 2024-04-21 ENCOUNTER — Other Ambulatory Visit: Payer: Self-pay

## 2024-04-21 ENCOUNTER — Emergency Department (HOSPITAL_BASED_OUTPATIENT_CLINIC_OR_DEPARTMENT_OTHER)
Admission: EM | Admit: 2024-04-21 | Discharge: 2024-04-21 | Disposition: A | Discharge status: Home / Self Care | Attending: Emergency Medicine | Admitting: Emergency Medicine

## 2024-04-21 DIAGNOSIS — J210 Acute bronchiolitis due to respiratory syncytial virus: Secondary | ICD-10-CM | POA: Insufficient documentation

## 2024-04-21 LAB — CBC WITH DIFFERENTIAL/PLATELET
Abs Immature Granulocytes: 0.01 10*3/uL (ref 0.00–0.07)
Basophils Absolute: 0 10*3/uL (ref 0.0–0.1)
Basophils Relative: 1 %
Eosinophils Absolute: 0.3 10*3/uL (ref 0.0–0.5)
Eosinophils Relative: 4 %
HCT: 40.4 % (ref 36.0–46.0)
Hemoglobin: 13.2 g/dL (ref 12.0–15.0)
Immature Granulocytes: 0 %
Lymphocytes Relative: 22 %
Lymphs Abs: 1.4 10*3/uL (ref 0.7–4.0)
MCH: 28.2 pg (ref 26.0–34.0)
MCHC: 32.7 g/dL (ref 30.0–36.0)
MCV: 86.3 fL (ref 80.0–100.0)
Monocytes Absolute: 0.5 10*3/uL (ref 0.1–1.0)
Monocytes Relative: 8 %
Neutro Abs: 4.2 10*3/uL (ref 1.7–7.7)
Neutrophils Relative %: 65 %
Platelets: 254 10*3/uL (ref 150–400)
RBC: 4.68 MIL/uL (ref 3.87–5.11)
RDW: 13.4 % (ref 11.5–15.5)
WBC: 6.4 10*3/uL (ref 4.0–10.5)
nRBC: 0 % (ref 0.0–0.2)

## 2024-04-21 LAB — RESP PANEL BY RT-PCR (RSV, FLU A&B, COVID)  RVPGX2
Influenza A by PCR: NEGATIVE
Influenza B by PCR: NEGATIVE
Resp Syncytial Virus by PCR: POSITIVE — AB
SARS Coronavirus 2 by RT PCR: NEGATIVE

## 2024-04-21 LAB — BASIC METABOLIC PANEL WITH GFR
Anion gap: 12 (ref 5–15)
BUN: 11 mg/dL (ref 8–23)
CO2: 23 mmol/L (ref 22–32)
Calcium: 9.4 mg/dL (ref 8.9–10.3)
Chloride: 100 mmol/L (ref 98–111)
Creatinine, Ser: 1 mg/dL (ref 0.44–1.00)
GFR, Estimated: 55 mL/min — ABNORMAL LOW
Glucose, Bld: 110 mg/dL — ABNORMAL HIGH (ref 70–99)
Potassium: 4.1 mmol/L (ref 3.5–5.1)
Sodium: 135 mmol/L (ref 135–145)

## 2024-04-21 MED ORDER — BENZONATATE 100 MG PO CAPS
100.0000 mg | ORAL_CAPSULE | Freq: Three times a day (TID) | ORAL | 0 refills | Status: AC
  Filled 2024-04-21: qty 21, 7d supply, fill #0

## 2024-04-21 MED ORDER — METHYLPREDNISOLONE 4 MG PO TBPK
ORAL_TABLET | ORAL | 0 refills | Status: AC
  Filled 2024-04-21: qty 21, 6d supply, fill #0

## 2024-04-21 NOTE — ED Notes (Signed)
 Pt d/c instructions, medications, and follow-up care reviewed with pt. Pt verbalized understanding and had no further questions at time of d/c. Pt CA&Ox4, ambulatory, and in NAD at time of d/c. Pt discharged with family.

## 2024-04-21 NOTE — ED Notes (Signed)
 Pt ambulated while monitoring SpO2. Pt started at 92% sitting, pt maintained 92-93% room air while ambulating. Pt denied any increase in [facility], no notable increase in WOB noted. EDP notified.

## 2024-04-21 NOTE — ED Triage Notes (Signed)
 Pt POV with husband d/t cough and congestion in chest for 1 week.  Pt has been seen and they did a Chest Xray but has gotten worse since then.
